# Patient Record
Sex: Female | Born: 1955 | Race: Black or African American | Hispanic: No | Marital: Married
Health system: Midwestern US, Community
[De-identification: ages and names within clinical notes are randomized; demographics above are authoritative.]

## PROBLEM LIST (undated history)

## (undated) DIAGNOSIS — G43909 Migraine, unspecified, not intractable, without status migrainosus: Secondary | ICD-10-CM

## (undated) DIAGNOSIS — E119 Type 2 diabetes mellitus without complications: Secondary | ICD-10-CM

## (undated) DIAGNOSIS — I10 Essential (primary) hypertension: Secondary | ICD-10-CM

## (undated) DIAGNOSIS — N200 Calculus of kidney: Secondary | ICD-10-CM

## (undated) DIAGNOSIS — M810 Age-related osteoporosis without current pathological fracture: Secondary | ICD-10-CM

## (undated) DIAGNOSIS — R079 Chest pain, unspecified: Secondary | ICD-10-CM

## (undated) DIAGNOSIS — I129 Hypertensive chronic kidney disease with stage 1 through stage 4 chronic kidney disease, or unspecified chronic kidney disease: Secondary | ICD-10-CM

## (undated) DIAGNOSIS — N83201 Unspecified ovarian cyst, right side: Secondary | ICD-10-CM

## (undated) DIAGNOSIS — N8501 Benign endometrial hyperplasia: Secondary | ICD-10-CM

## (undated) DIAGNOSIS — Z01818 Encounter for other preprocedural examination: Secondary | ICD-10-CM

## (undated) DIAGNOSIS — N950 Postmenopausal bleeding: Secondary | ICD-10-CM

## (undated) DIAGNOSIS — D259 Leiomyoma of uterus, unspecified: Secondary | ICD-10-CM

## (undated) DIAGNOSIS — Z1231 Encounter for screening mammogram for malignant neoplasm of breast: Secondary | ICD-10-CM

## (undated) DIAGNOSIS — K222 Esophageal obstruction: Secondary | ICD-10-CM

## (undated) DIAGNOSIS — R102 Pelvic and perineal pain: Secondary | ICD-10-CM

## (undated) DIAGNOSIS — I517 Cardiomegaly: Secondary | ICD-10-CM

## (undated) DIAGNOSIS — N83209 Unspecified ovarian cyst, unspecified side: Secondary | ICD-10-CM

## (undated) DIAGNOSIS — K5730 Diverticulosis of large intestine without perforation or abscess without bleeding: Secondary | ICD-10-CM

## (undated) DIAGNOSIS — E538 Deficiency of other specified B group vitamins: Secondary | ICD-10-CM

## (undated) DIAGNOSIS — K219 Gastro-esophageal reflux disease without esophagitis: Secondary | ICD-10-CM

## (undated) DIAGNOSIS — K259 Gastric ulcer, unspecified as acute or chronic, without hemorrhage or perforation: Secondary | ICD-10-CM

## (undated) DIAGNOSIS — D529 Folate deficiency anemia, unspecified: Secondary | ICD-10-CM

## (undated) DIAGNOSIS — E785 Hyperlipidemia, unspecified: Secondary | ICD-10-CM

## (undated) DIAGNOSIS — N281 Cyst of kidney, acquired: Secondary | ICD-10-CM

## (undated) DIAGNOSIS — I509 Heart failure, unspecified: Secondary | ICD-10-CM

## (undated) DIAGNOSIS — I2510 Atherosclerotic heart disease of native coronary artery without angina pectoris: Secondary | ICD-10-CM

## (undated) DIAGNOSIS — N189 Chronic kidney disease, unspecified: Secondary | ICD-10-CM

## (undated) DIAGNOSIS — I493 Ventricular premature depolarization: Secondary | ICD-10-CM

## (undated) DIAGNOSIS — E559 Vitamin D deficiency, unspecified: Secondary | ICD-10-CM

## (undated) DIAGNOSIS — G2581 Restless legs syndrome: Secondary | ICD-10-CM

## (undated) DIAGNOSIS — N179 Acute kidney failure, unspecified: Secondary | ICD-10-CM

## (undated) DIAGNOSIS — R87629 Unspecified abnormal cytological findings in specimens from vagina: Secondary | ICD-10-CM

## (undated) DIAGNOSIS — M8589 Other specified disorders of bone density and structure, multiple sites: Secondary | ICD-10-CM

## (undated) DIAGNOSIS — I340 Nonrheumatic mitral (valve) insufficiency: Secondary | ICD-10-CM

## (undated) DIAGNOSIS — N8502 Endometrial intraepithelial neoplasia [EIN]: Secondary | ICD-10-CM

## (undated) DIAGNOSIS — G4733 Obstructive sleep apnea (adult) (pediatric): Secondary | ICD-10-CM

## (undated) DIAGNOSIS — E282 Polycystic ovarian syndrome: Secondary | ICD-10-CM

## (undated) DIAGNOSIS — M879 Osteonecrosis, unspecified: Secondary | ICD-10-CM

## (undated) DIAGNOSIS — L680 Hirsutism: Secondary | ICD-10-CM

## (undated) HISTORY — DX: Essential (primary) hypertension: I10

## (undated) HISTORY — DX: Migraine, unspecified, not intractable, without status migrainosus: G43.909

## (undated) HISTORY — PX: GASTRIC BYPASS: SHX52

## (undated) HISTORY — DX: Type 2 diabetes mellitus without complications: E11.9

## (undated) HISTORY — DX: Unspecified abnormal cytological findings in specimens from vagina: R87.629

## (undated) HISTORY — DX: Deficiency of other specified B group vitamins: E53.8

## (undated) HISTORY — DX: Other specified disorders of bone density and structure, multiple sites: M85.89

## (undated) HISTORY — DX: Heart failure, unspecified: I50.9

## (undated) HISTORY — DX: Diverticulosis of large intestine without perforation or abscess without bleeding: K57.30

## (undated) HISTORY — DX: Vitamin D deficiency, unspecified: E55.9

## (undated) HISTORY — PX: ABDOMINAL HYSTERECTOMY: SHX81

## (undated) HISTORY — DX: Ventricular premature depolarization: I49.3

## (undated) HISTORY — DX: Acute kidney failure, unspecified: N17.9

## (undated) HISTORY — DX: Unspecified ovarian cyst, unspecified side: N83.209

## (undated) HISTORY — DX: Age-related osteoporosis without current pathological fracture: M81.0

## (undated) HISTORY — DX: Chronic kidney disease, unspecified: N18.9

## (undated) HISTORY — DX: Hyperlipidemia, unspecified: E78.5

## (undated) HISTORY — DX: Folate deficiency anemia, unspecified: D52.9

## (undated) HISTORY — DX: Restless legs syndrome: G25.81

## (undated) HISTORY — DX: Osteonecrosis, unspecified: M87.9

## (undated) HISTORY — DX: Gastro-esophageal reflux disease without esophagitis: K21.9

## (undated) HISTORY — DX: Nonrheumatic mitral (valve) insufficiency: I34.0

## (undated) HISTORY — DX: Polycystic ovarian syndrome: E28.2

## (undated) HISTORY — DX: Cardiomegaly: I51.7

## (undated) HISTORY — DX: Endometrial intraepithelial neoplasia (EIN): N85.02

## (undated) HISTORY — DX: Gastric ulcer, unspecified as acute or chronic, without hemorrhage or perforation: K25.9

## (undated) HISTORY — DX: Cyst of kidney, acquired: N28.1

## (undated) HISTORY — PX: OOPHORECTOMY: SHX86

## (undated) HISTORY — DX: Obstructive sleep apnea (adult) (pediatric): G47.33

## (undated) HISTORY — DX: Atherosclerotic heart disease of native coronary artery without angina pectoris: I25.10

## (undated) NOTE — Progress Notes (Signed)
 Formatting of this note might be different from the original. 1. Have you been to the ER, urgent care clinic since your last visit?  Hospitalized since your last visit?   Yes When: 12/2020 Where: [facility]  Reason for visit: hysterectomy 2. Have you seen or consulted any other health care providers outside of the [facility] since your last visit?  Include any pap smears or colon screening.    No   Electronically signed by Vaughn Dess at 04/21/2021 10:53 AM EST

## (undated) NOTE — Progress Notes (Signed)
 Formatting of this note is different from the original. HISTORY OF PRESENT ILLNESS Monique Graham is a 22 y.o. female.  08/25/2020 Patient seen today for new patient evaluation.  She is here for evaluation of chest pain.  She was in emergency room recently with complaint of chest tightness going on for about 10 days.  She has been having episode of chest tightness.  Also has been feeling palpitation.  These are progressive.  She is short of breath and only heavy exertion.  No edema.  No orthopnea she does notice some breathing disturbance during sleep according to her spouse.  Review of Systems  Constitutional:  Negative for chills and fever.  HENT:  Negative for nosebleeds.   Eyes:  Negative for blurred vision and double vision.  Respiratory:  Negative for cough, hemoptysis, sputum production, shortness of breath and wheezing.   Cardiovascular:  Positive for chest pain and palpitations. Negative for orthopnea, claudication, leg swelling and PND.  Gastrointestinal:  Negative for abdominal pain, heartburn, nausea and vomiting.  Musculoskeletal:  Negative for myalgias.  Skin:  Negative for rash.  Neurological:  Negative for dizziness, weakness and headaches.  Endo/Heme/Allergies:  Does not bruise/bleed easily. 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   Gout    Hypercholesteremia    Hypertension    Kidney cysts    Kidney stones    Migraines    not in 7 years   Obesity    Sarcoidosis unsure   pt states in remission for a long time   Past Surgical History:  Procedure Laterality Date   HX COLONOSCOPY     HX DILATION AND CURETTAGE     HX ENDOSCOPY     several times   HX TONSILLECTOMY  age 60    PR LAP GASTRIC BYPASS/ROUX-EN-Y  2005   Social History   Tobacco Use   Smoking status: Former    Packs/day: 1.00    Types: Cigarettes   Smokeless tobacco: Never   Tobacco comments:    30-40 years  Substance Use Topics   Alcohol use: Yes    Comment: wine during the holidays   Allergies  Allergen Reactions   Latex, Natural Rubber Rash   Percocet [Oxycodone-Acetaminophen] Anaphylaxis   Lactose Diarrhea   Prior to Admission medications   Medication Sig Start Date End Date Taking? Authorizing Provider  FOLIC ACID PO Take 800 mcg by mouth daily.   Yes Provider, Historical  metoprolol  tartrate (LOPRESSOR ) 50 mg tablet Take 1 Tablet by mouth two (2) times a day. 04/21/21  Yes Patel, Apurva M, MD  ibuprofen (MOTRIN) 800 mg tablet Take 1 Tablet by mouth every eight (8) hours as needed for Pain. 12/20/20  Yes Sharl Harlene PARAS, MD  atorvastatin  (LIPITOR) 40 mg tablet TAKE 1 TABLET BY MOUTH EVERY DAY 12/12/20  Yes Patel, Apurva M, MD  aspirin delayed-release 81 mg tablet Take 81 mg by mouth daily.   Yes Provider, Historical  cholecalciferol, vitamin D3, 50 mcg (2,000 unit) tab Take 1 Tablet by mouth daily. 05/25/20  Yes Provider, Historical  losartan (COZAAR) 100 mg tablet Take  100 mg by mouth daily. 06/14/20 06/14/21 Yes Provider, Historical  sodium bicarbonate 650 mg tablet Take 650 mg by mouth two (2) times a day. 05/25/20  Yes Provider, Historical  allopurinoL (ZYLOPRIM) 100 mg tablet Take  by mouth as needed. am   Yes Provider, Historical  cloNIDine  HCL (CATAPRES ) 0.1 mg tablet Take 0.1 mg by mouth two (2) times a day.   Yes Provider, Historical  acetaminophen (TYLENOL) 650 mg TbER Take 650 mg by mouth as needed.   Yes Provider, Historical   Visit Vitals BP (!) 159/113  Pulse 95  Ht 5' 4 (1.626 m)  Wt 107.5 kg (237 lb)  SpO2 98%  BMI 40.68 kg/m   Physical Exam Constitutional:      Appearance: She is well-developed.  HENT:     Head: Normocephalic and atraumatic.  Eyes:      Conjunctiva/sclera: Conjunctivae normal.  Neck:     Thyroid: No thyromegaly.     Vascular: No JVD.     Trachea: No tracheal deviation.  Cardiovascular:     Rate and Rhythm: Normal rate and regular rhythm.     Chest Wall: PMI is not displaced.     Pulses: No decreased pulses.     Heart sounds: No murmur heard.   No gallop. No S3 sounds.  Pulmonary:     Effort: No respiratory distress.     Breath sounds: No wheezing or rales.  Chest:     Chest wall: No tenderness.  Abdominal:     Palpations: Abdomen is soft.     Tenderness: There is no abdominal tenderness.  Musculoskeletal:     Cervical back: Neck supple.  Skin:    General: Skin is warm.  Neurological:     Mental Status: She is alert and oriented to person, place, and time.   Ms. Werk has a reminder for a due or due soon health maintenance. I have asked that she contact her primary care provider for follow-up on this health maintenance.  No flowsheet data found. I have personally reviewed patient's records available from hospital and other providers and incorporated findings in patient care. Provider notes, lab, EKG, chest x-ray I have personally reviewed patients ekg done at other facility. Sinus rhythm, LVH, nonspecific ST-T changes IMPRESSION.  Chest CT for coronary calcium  for 2022  Coronary calcium  score 159. IMPRESSION CT 08/2020  Diffuse hepatic steatosis. Healed granulomatous disease. Benign noncalcified small node left lower lobe. No imaging follow-up needed. Coronary and cardiac findings will be described under a separate report completed by cardiology.  Interpretation Summary 09/2020     Left Ventricle: Normal left ventricular systolic function with a visually estimated EF of 55 - 60%. Left ventricle size is normal. Increased wall thickness. Findings consistent with mild concentric hypertrophy. Normal wall motion. Diastolic dysfunction present with increased LAP with normal LV EF.  Charlanne Nolan POUR, MD  ECG Reader, SPECT Reader, Test Supervisor 09/30/2020   Interpretation Summary 09/2020     Nuclear Findings: Normal left ventricular systolic function post-stress. LVEF measures 66%.   Nuclear Findings: Normal treadmill myocardial perfusion study at 94 %% PHR. Findings suggest a low risk of myocardial ischemia. LVEF measures 66%.   Nuclear Findings: LVEF measures 66%. LV perfusion is normal.   ECG: Resting ECG demonstrates normal sinus rhythm.   ECG: Stress ECG was negative for ischemia.   Stress Test: A Bruce protocol stress test was performed. Overall, the patient's exercise capacity was poor for their age. The patient exercised for 2 min  and 57 sec. Hemodynamics are adequate for diagnosis. Blood pressure demonstrated a normal response and heart rate demonstrated a normal response to stress.  09/2020-event monitor Rare PAC and PVC.  Occasional symptom with PVC Assessment     ICD-10-CM ICD-9-CM   1. Coronary artery calcification  I25.10 414.00    I25.84 414.4    Stable symptoms continue treatment monitor   2. Primary hypertension  I10 401.9    Elevated blood pressure adjust medicine   3. PVC (premature ventricular contraction)  I49.3 427.69    Stable symptom monitor   4. Chest pain, unspecified type  R07.9 786.50    Stable continue treatment   5. Palpitation  R00.2 785.1    Intermittent increased palpitation increase metoprolol     08/2020 Seen for atypical chest pain and palpitation.  Has history of sarcoidosis in past.  Follow-up after testing.  If needed consider sleep apnea evaluation  09/2020 Work-up reviewed.  Medical management.  Follow-up lab after statin treatment continue aspirin 04/2021 Recent increased palpitation blood pressure elevated increase metoprolol  to 50 mg twice a day continue other treatment.  Overall has weight loss, continue dietary modification and exercise  Medications Discontinued During This Encounter  Medication Reason   ibuprofen (MOTRIN) 800 mg  tablet Not A Current Medication   B.animalis,bifid,infantis,long 10-15 mg TbEC Not A Current Medication   metoprolol  tartrate (LOPRESSOR ) 25 mg tablet    Orders Placed This Encounter   metoprolol  tartrate (LOPRESSOR ) 50 mg tablet    Sig: Take 1 Tablet by mouth two (2) times a day.    Dispense:  180 Tablet    Refill:  3   Follow-up and Dispositions   Return in about 6 months (around 10/20/2021).    Electronically signed by Tobie Elene HERO, MD at 04/21/2021 10:53 AM EST

---

## 2005-08-23 ENCOUNTER — Emergency Department: Payer: Self-pay | Admitting: Emergency Medicine

## 2005-09-10 ENCOUNTER — Emergency Department (HOSPITAL_COMMUNITY): Admission: EM | Admit: 2005-09-10 | Discharge: 2005-09-10 | Payer: Self-pay | Admitting: Emergency Medicine

## 2008-06-01 ENCOUNTER — Encounter: Admission: RE | Admit: 2008-06-01 | Discharge: 2008-06-01 | Payer: Self-pay | Admitting: Internal Medicine

## 2011-10-01 ENCOUNTER — Observation Stay: Payer: Self-pay | Admitting: Internal Medicine

## 2011-10-01 LAB — COMPREHENSIVE METABOLIC PANEL
Alkaline Phosphatase: 112 U/L (ref 50–136)
Calcium, Total: 8.6 mg/dL (ref 8.5–10.1)
Co2: 23 mmol/L (ref 21–32)
EGFR (African American): 39 — ABNORMAL LOW
EGFR (Non-African Amer.): 34 — ABNORMAL LOW
Osmolality: 286 (ref 275–301)
Potassium: 4 mmol/L (ref 3.5–5.1)
SGOT(AST): 20 U/L (ref 15–37)
Sodium: 141 mmol/L (ref 136–145)
Total Protein: 8.6 g/dL — ABNORMAL HIGH (ref 6.4–8.2)

## 2011-10-01 LAB — CBC
HCT: 41.3 % (ref 35.0–47.0)
HGB: 12.8 g/dL (ref 12.0–16.0)
MCH: 23 pg — ABNORMAL LOW (ref 26.0–34.0)
MCV: 75 fL — ABNORMAL LOW (ref 80–100)
Platelet: 274 10*3/uL (ref 150–440)
RBC: 5.55 10*6/uL — ABNORMAL HIGH (ref 3.80–5.20)

## 2011-10-01 LAB — URINALYSIS, COMPLETE
Blood: NEGATIVE
Ketone: NEGATIVE
Leukocyte Esterase: NEGATIVE
Protein: NEGATIVE
Specific Gravity: 1.017 (ref 1.003–1.030)
Squamous Epithelial: NONE SEEN

## 2011-10-01 LAB — CK TOTAL AND CKMB (NOT AT ARMC): CK, Total: 106 U/L (ref 21–215)

## 2011-10-02 LAB — LIPID PANEL
Cholesterol: 257 mg/dL — ABNORMAL HIGH (ref 0–200)
HDL Cholesterol: 35 mg/dL — ABNORMAL LOW (ref 40–60)
Ldl Cholesterol, Calc: 181 mg/dL — ABNORMAL HIGH (ref 0–100)
VLDL Cholesterol, Calc: 41 mg/dL — ABNORMAL HIGH (ref 5–40)

## 2011-10-02 LAB — HEMOGLOBIN A1C: Hemoglobin A1C: 6.6 % — ABNORMAL HIGH (ref 4.2–6.3)

## 2012-11-01 IMAGING — CT CT HEAD WITHOUT CONTRAST
2 series · 16 of 30 positions shown, 20 images · non-contrast
Comparison: none

REASON FOR EXAM: numbness/tingling
COMMENTS:

PROCEDURE:     CT  - CT HEAD WITHOUT CONTRAST  - October 01, 2011  [DATE]
RESULT:     History: Numbness.
Comparison Study: None.

[Series 2: without · axial · non-contrast · 0.41mm/px · z∈[-176,-50]mm · 13 of 31 slices shown, 17 images]
[im 3/31  brain]
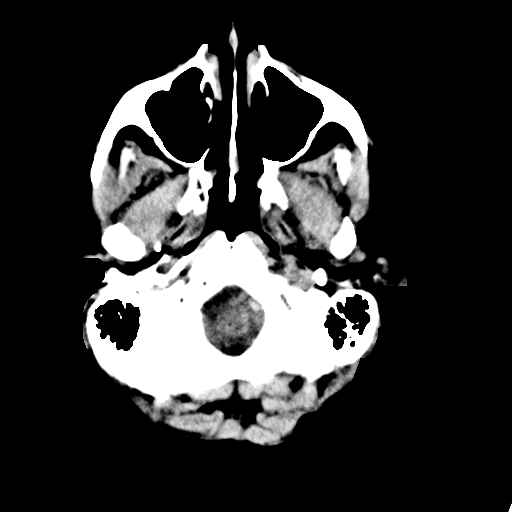
[im 3/31  bone]
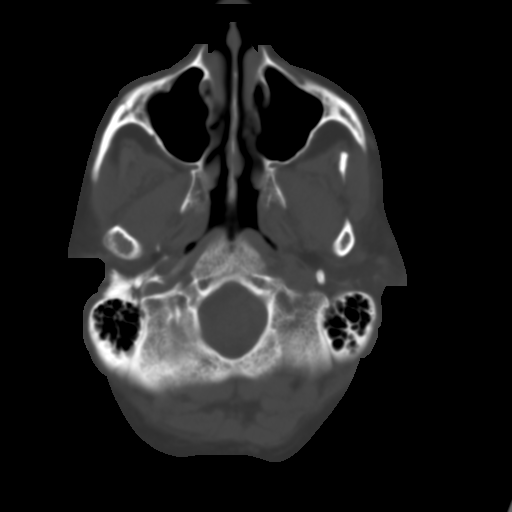
[im 5/31  brain]
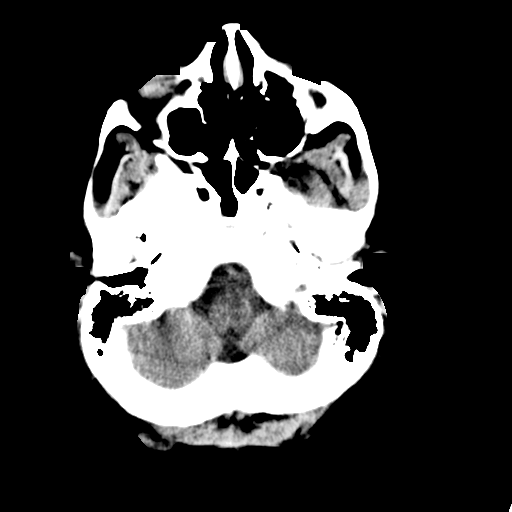
[im 7/31  brain]
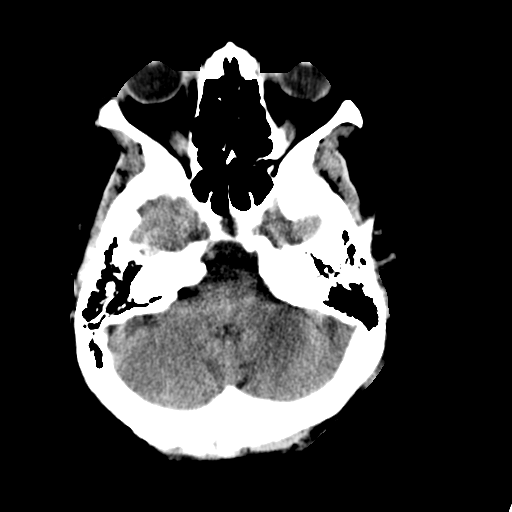
[im 9/31  brain]
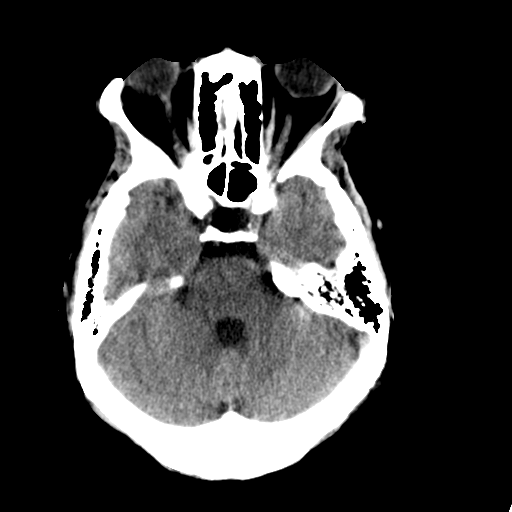
[im 11/31  brain]
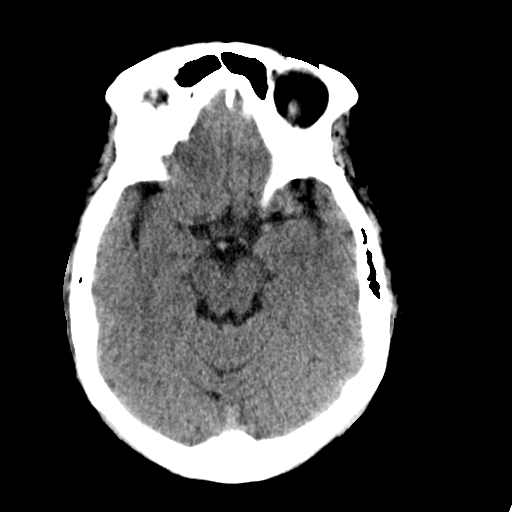
[im 11/31  bone]
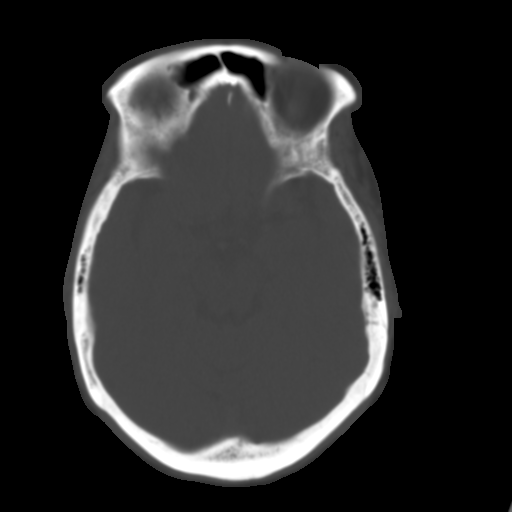
[im 13/31  brain]
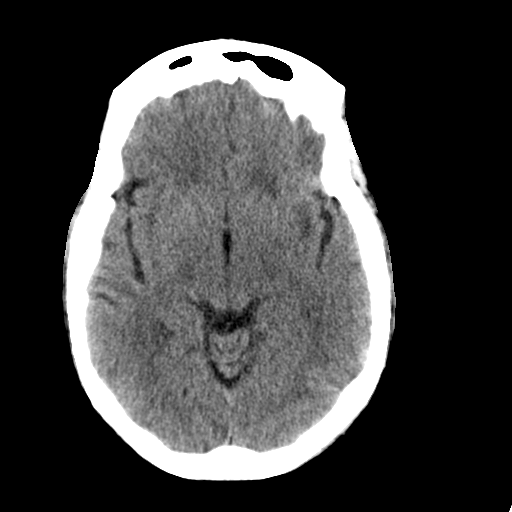
[im 16/31  brain]
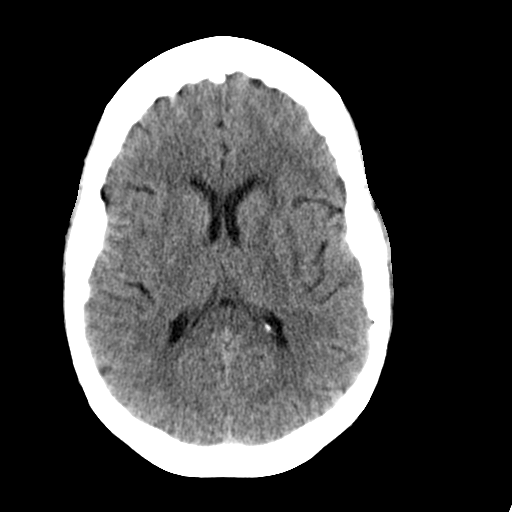
[im 18/31  brain]
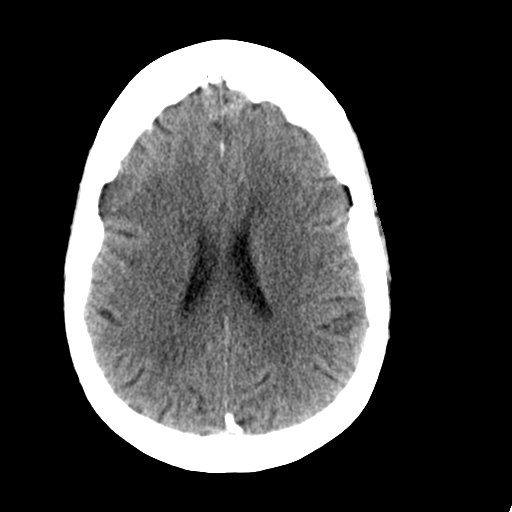
[im 20/31  brain]
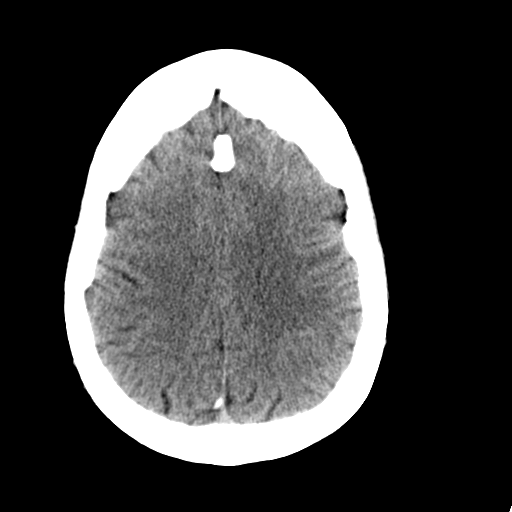
[im 20/31  bone]
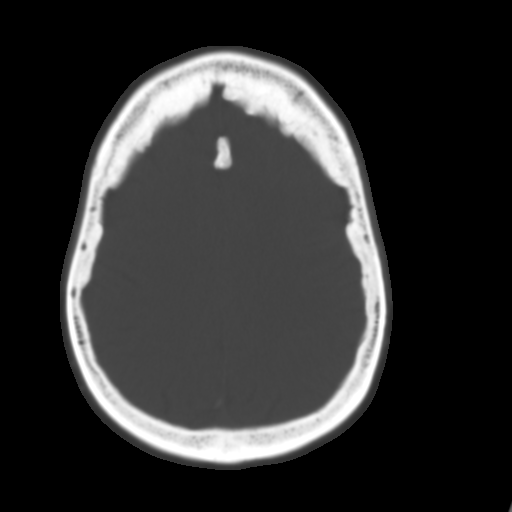
[im 22/31  brain]
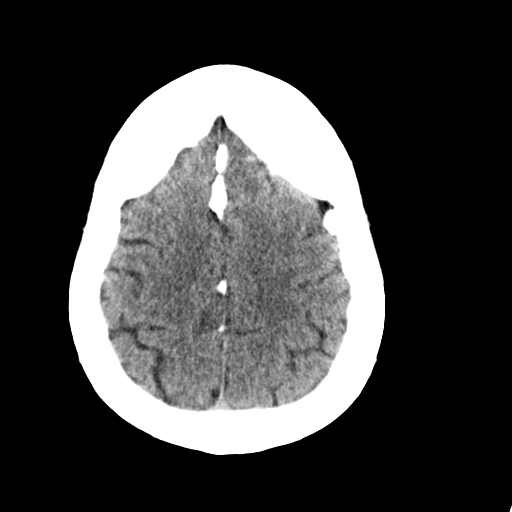
[im 24/31  brain]
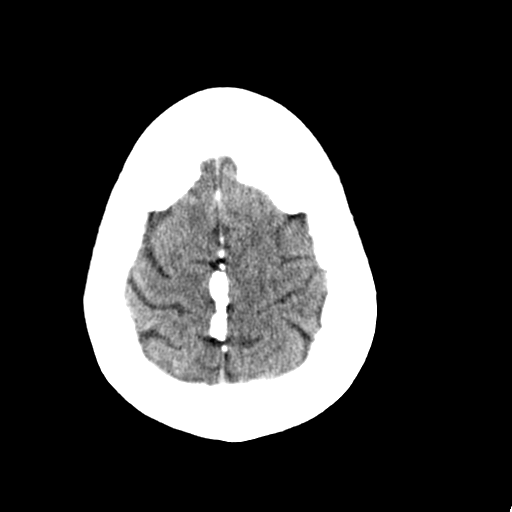
[im 26/31  brain]
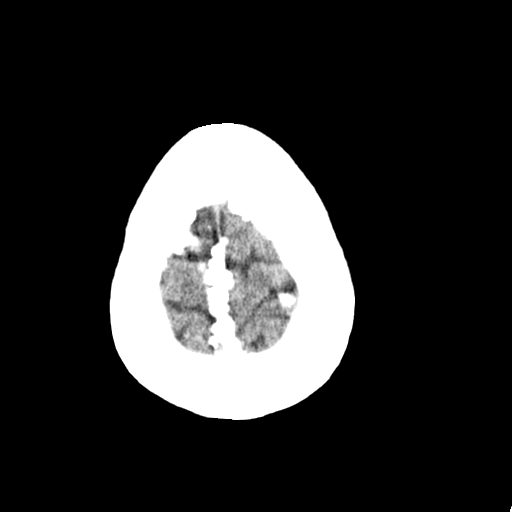
[im 28/31  brain]
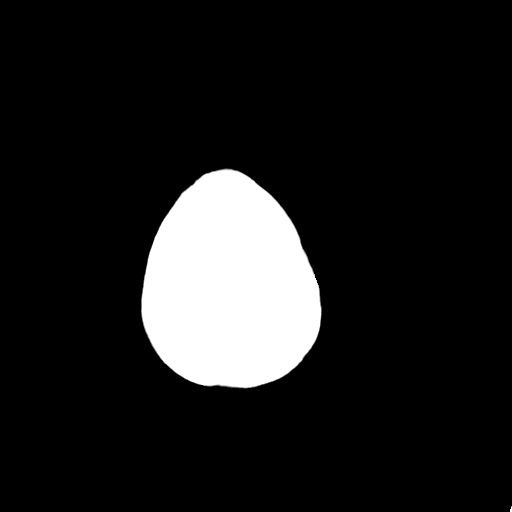
[im 28/31  bone]
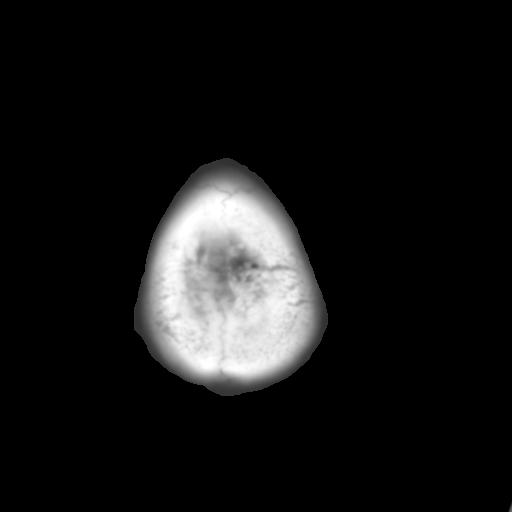

[Series 3: bone · axial · 0.41mm/px · z∈[-176,-136]mm · 3 of 31 slices shown]
[im 3/31  bone]
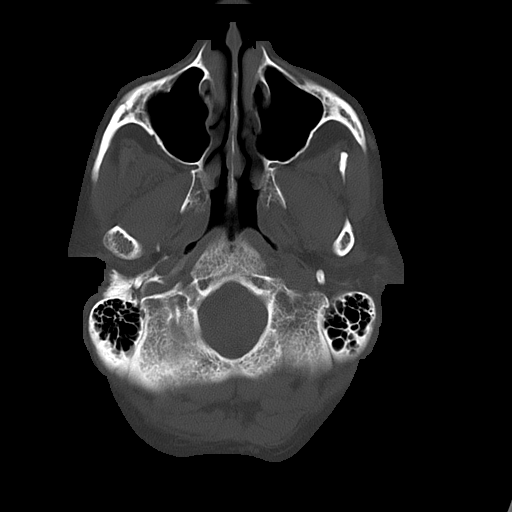
[im 7/31  bone]
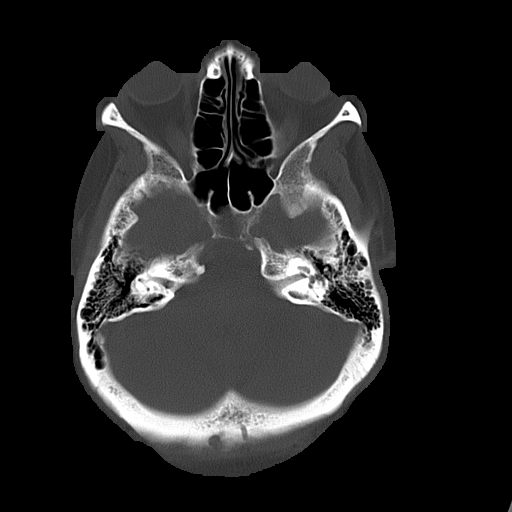
[im 11/31  bone]
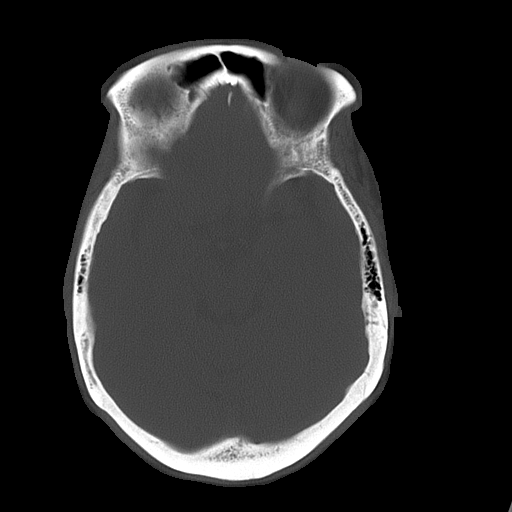

[16 of 30 positions shown; findings below may reference images not displayed]

FINDINGS: No mass. No hydrocephalus. No hemorrhage. White matter changes
consistent with chronic ischemia. Lucency noted in the brainstem consistent
with old infarct. Dural calcification present. No acute bony abnormality.
IMPRESSION: White matter changes consistent with chronic ischemia. No
acute abnormality.

## 2012-11-01 IMAGING — CR DG CHEST 2V
1 series · 2 of 2 positions shown · non-contrast
Comparison: none

REASON FOR EXAM: chest pain
COMMENTS:

[Series 1: w chest pa · 0.14mm/px · 2 of 2 slices shown]
[im 1/2]
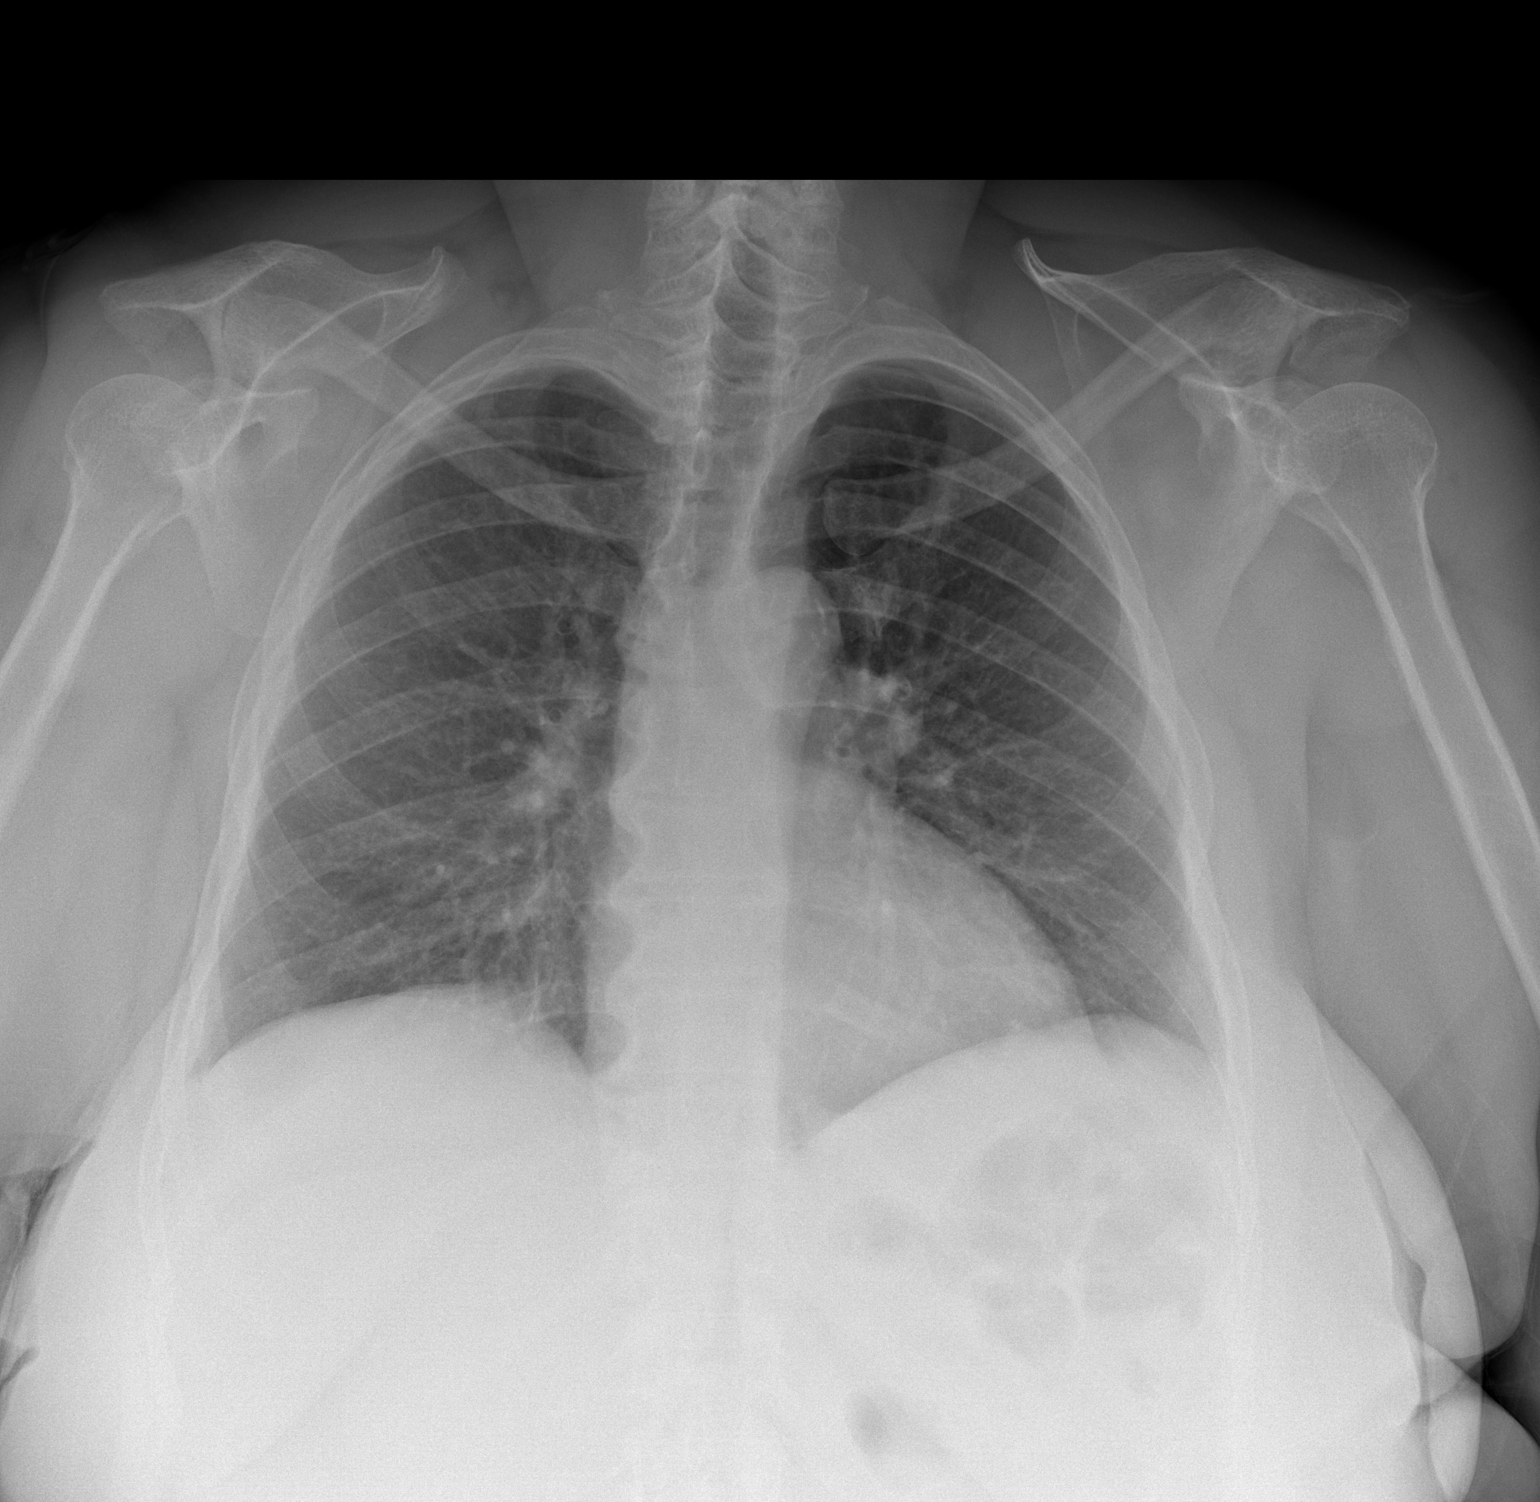
[im 2/2]
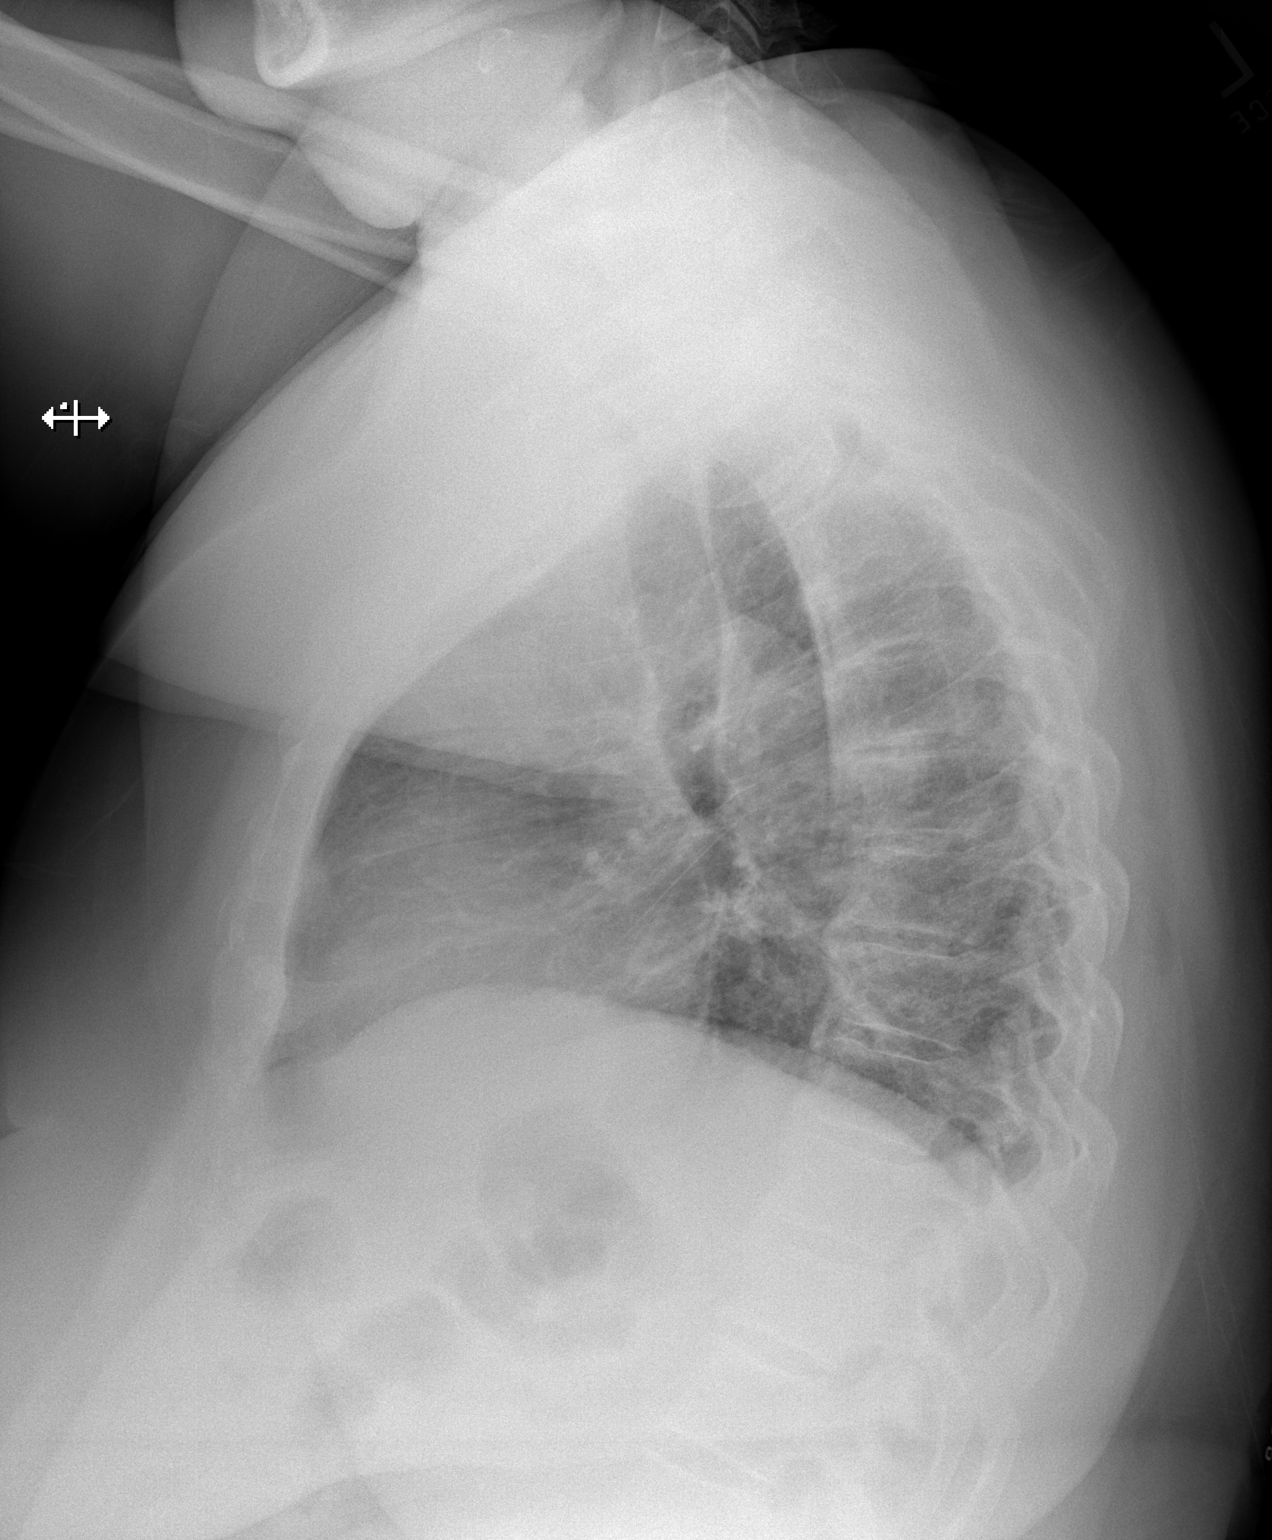

[2 of 2 positions shown; findings below may reference images not displayed]

PROCEDURE:     DXR - DXR CHEST PA (OR AP) AND LATERAL  - October 01, 2011  [DATE]

RESULT:     There is no previous exam for comparison.

The lungs are clear. The heart and pulmonary vessels are normal. The bony
and mediastinal structures are unremarkable. There is no effusion. There is
no pneumothorax or evidence of congestive failure.
IMPRESSION: No acute cardiopulmonary disease.

[REDACTED]

## 2013-12-18 ENCOUNTER — Observation Stay: Payer: BLUE CROSS/BLUE SHIELD | Admitting: Internal Medicine

## 2013-12-18 ENCOUNTER — Emergency Department: Payer: BLUE CROSS/BLUE SHIELD

## 2013-12-18 ENCOUNTER — Observation Stay
Admission: EM | Admit: 2013-12-18 | Discharge: 2013-12-20 | Disposition: A | Discharge status: Home / Self Care | Payer: BLUE CROSS/BLUE SHIELD | Attending: Internal Medicine | Admitting: Internal Medicine

## 2013-12-18 DIAGNOSIS — I10 Essential (primary) hypertension: Secondary | ICD-10-CM | POA: Insufficient documentation

## 2013-12-18 DIAGNOSIS — R51 Headache: Secondary | ICD-10-CM | POA: Insufficient documentation

## 2013-12-18 DIAGNOSIS — N179 Acute kidney failure, unspecified: Secondary | ICD-10-CM | POA: Insufficient documentation

## 2013-12-18 DIAGNOSIS — I6789 Other cerebrovascular disease: Secondary | ICD-10-CM | POA: Insufficient documentation

## 2013-12-18 DIAGNOSIS — I160 Hypertensive urgency: Secondary | ICD-10-CM | POA: Insufficient documentation

## 2013-12-18 DIAGNOSIS — Z8249 Family history of ischemic heart disease and other diseases of the circulatory system: Secondary | ICD-10-CM | POA: Insufficient documentation

## 2013-12-18 DIAGNOSIS — E669 Obesity, unspecified: Secondary | ICD-10-CM | POA: Insufficient documentation

## 2013-12-18 DIAGNOSIS — Z833 Family history of diabetes mellitus: Secondary | ICD-10-CM | POA: Insufficient documentation

## 2013-12-18 DIAGNOSIS — E119 Type 2 diabetes mellitus without complications: Secondary | ICD-10-CM | POA: Insufficient documentation

## 2013-12-18 DIAGNOSIS — Z9884 Bariatric surgery status: Secondary | ICD-10-CM | POA: Insufficient documentation

## 2013-12-18 DIAGNOSIS — D509 Iron deficiency anemia, unspecified: Secondary | ICD-10-CM | POA: Diagnosis present

## 2013-12-18 DIAGNOSIS — R0789 Other chest pain: Secondary | ICD-10-CM

## 2013-12-18 DIAGNOSIS — Z6841 Body Mass Index (BMI) 40.0 and over, adult: Secondary | ICD-10-CM | POA: Insufficient documentation

## 2013-12-18 DIAGNOSIS — R079 Chest pain, unspecified: Principal | ICD-10-CM | POA: Insufficient documentation

## 2013-12-18 LAB — B-TYPE NATRIURETIC PEPTIDE: B-Natriuretic Peptide: 19 pg/mL (ref 0–100)

## 2013-12-18 LAB — COMPREHENSIVE METABOLIC PANEL
ALT: 22 U/L (ref 0–55)
AST (SGOT): 19 U/L (ref 5–34)
Albumin/Globulin Ratio: 0.9 (ref 0.9–2.2)
Albumin: 3.6 g/dL (ref 3.5–5.0)
Alkaline Phosphatase: 121 U/L — ABNORMAL HIGH (ref 37–106)
Anion Gap: 10 (ref 5.0–15.0)
BUN: 11 mg/dL (ref 7.0–19.0)
Bilirubin, Total: 0.7 mg/dL (ref 0.2–1.2)
CO2: 23 mEq/L (ref 22–29)
Calcium: 9 mg/dL (ref 8.5–10.5)
Chloride: 109 mEq/L (ref 100–111)
Creatinine: 1.2 mg/dL — ABNORMAL HIGH (ref 0.6–1.0)
Globulin: 3.8 g/dL — ABNORMAL HIGH (ref 2.0–3.6)
Glucose: 112 mg/dL — ABNORMAL HIGH (ref 70–100)
Potassium: 4.2 mEq/L (ref 3.5–5.1)
Protein, Total: 7.4 g/dL (ref 6.0–8.3)
Sodium: 142 mEq/L (ref 136–145)

## 2013-12-18 LAB — CBC AND DIFFERENTIAL
Basophils Absolute Automated: 0.01 10*3/uL (ref 0.00–0.20)
Basophils Automated: 0 %
Eosinophils Absolute Automated: 0.16 10*3/uL (ref 0.00–0.70)
Eosinophils Automated: 3 %
Hematocrit: 39.6 % (ref 37.0–47.0)
Hgb: 12.5 g/dL (ref 12.0–16.0)
Lymphocytes Absolute Automated: 1.95 10*3/uL (ref 0.50–4.40)
Lymphocytes Automated: 32 %
MCH: 23.5 pg — ABNORMAL LOW (ref 28.0–32.0)
MCHC: 31.6 g/dL — ABNORMAL LOW (ref 32.0–36.0)
MCV: 74.6 fL — ABNORMAL LOW (ref 80.0–100.0)
MPV: 12.4 fL — ABNORMAL HIGH (ref 9.4–12.3)
Monocytes Absolute Automated: 0.42 10*3/uL (ref 0.00–1.20)
Monocytes: 7 %
Neutrophils Absolute: 3.56 10*3/uL (ref 1.80–8.10)
Neutrophils: 58 %
Platelets: 252 10*3/uL (ref 140–400)
RBC: 5.31 10*6/uL (ref 4.20–5.40)
RDW: 17 % — ABNORMAL HIGH (ref 12–15)
WBC: 6.1 10*3/uL (ref 3.50–10.80)

## 2013-12-18 LAB — TROPONIN I
Troponin I: 0.02 ng/mL (ref 0.00–0.09)
Troponin I: <0.01 ng/mL (ref 0.00–0.09)
Troponin I: <0.01 ng/mL (ref 0.00–0.09)

## 2013-12-18 LAB — PT/INR
PT INR: 1 (ref 0.9–1.1)
PT: 13.3 s (ref 12.6–15.0)

## 2013-12-18 LAB — GFR: EGFR: 45.9

## 2013-12-18 LAB — LIPASE: Lipase: 9 U/L (ref 8–78)

## 2013-12-18 MED ORDER — DIPHENHYDRAMINE HCL 25 MG PO CAPS
25.0000 mg | ORAL_CAPSULE | Freq: Four times a day (QID) | ORAL | Status: DC | PRN
Start: 2013-12-18 — End: 2013-12-20

## 2013-12-18 MED ORDER — ACETAMINOPHEN 500 MG PO TABS
1000.0000 mg | ORAL_TABLET | Freq: Once | ORAL | Status: AC
Start: 2013-12-18 — End: 2013-12-18
  Administered 2013-12-18: 1000 mg via ORAL
  Filled 2013-12-18: qty 2

## 2013-12-18 MED ORDER — DIPHENHYDRAMINE HCL 50 MG/ML IJ SOLN
12.5000 mg | Freq: Four times a day (QID) | INTRAMUSCULAR | Status: DC | PRN
Start: 2013-12-18 — End: 2013-12-20
  Administered 2013-12-18 – 2013-12-19 (×3): 12.5 mg via INTRAVENOUS
  Filled 2013-12-18 (×3): qty 1

## 2013-12-18 MED ORDER — NITROGLYCERIN 0.4 MG SL SUBL
0.4000 mg | SUBLINGUAL_TABLET | SUBLINGUAL | Status: DC | PRN
Start: 2013-12-18 — End: 2013-12-20

## 2013-12-18 MED ORDER — IBUPROFEN 600 MG PO TABS
600.0000 mg | ORAL_TABLET | Freq: Four times a day (QID) | ORAL | Status: DC | PRN
Start: 2013-12-18 — End: 2013-12-20
  Administered 2013-12-19: 600 mg via ORAL
  Filled 2013-12-18: qty 1

## 2013-12-18 MED ORDER — BISACODYL 5 MG PO TBEC
5.0000 mg | DELAYED_RELEASE_TABLET | Freq: Every day | ORAL | Status: DC | PRN
Start: 2013-12-18 — End: 2013-12-20

## 2013-12-18 MED ORDER — LABETALOL HCL 5 MG/ML IV SOLN
10.0000 mg | INTRAVENOUS | Status: DC | PRN
Start: 2013-12-18 — End: 2013-12-20
  Administered 2013-12-19: 10 mg via INTRAVENOUS
  Filled 2013-12-18: qty 4

## 2013-12-18 MED ORDER — ASPIRIN 325 MG PO TABS
325.0000 mg | ORAL_TABLET | Freq: Once | ORAL | Status: AC
Start: 2013-12-18 — End: 2013-12-18
  Administered 2013-12-18: 325 mg via ORAL
  Filled 2013-12-18: qty 1

## 2013-12-18 MED ORDER — MORPHINE SULFATE 4 MG/ML IJ/IV SOLN (WRAP)
Status: AC
Start: 2013-12-18 — End: 2013-12-19
  Filled 2013-12-18: qty 1

## 2013-12-18 MED ORDER — HYDROMORPHONE HCL PF 1 MG/ML IJ SOLN
0.5000 mg | INTRAMUSCULAR | Status: DC | PRN
Start: 2013-12-18 — End: 2013-12-20
  Administered 2013-12-18 – 2013-12-19 (×3): 0.5 mg via INTRAVENOUS
  Filled 2013-12-18 (×3): qty 1

## 2013-12-18 MED ORDER — AMLODIPINE BESYLATE 5 MG PO TABS
10.0000 mg | ORAL_TABLET | Freq: Every day | ORAL | Status: DC
Start: 2013-12-19 — End: 2013-12-19
  Filled 2013-12-18: qty 2

## 2013-12-18 MED ORDER — NITROGLYCERIN 0.4 MG SL SUBL
0.4000 mg | SUBLINGUAL_TABLET | SUBLINGUAL | Status: DC | PRN
Start: 2013-12-18 — End: 2013-12-18
  Administered 2013-12-18 (×3): 0.4 mg via SUBLINGUAL
  Filled 2013-12-18: qty 1

## 2013-12-18 MED ORDER — NALOXONE HCL 0.4 MG/ML IJ SOLN
0.2000 mg | INTRAMUSCULAR | Status: DC | PRN
Start: 2013-12-18 — End: 2013-12-20

## 2013-12-18 MED ORDER — ONDANSETRON HCL 4 MG/2ML IJ SOLN
4.0000 mg | Freq: Once | INTRAMUSCULAR | Status: AC
Start: 2013-12-18 — End: 2013-12-18
  Administered 2013-12-18: 4 mg via INTRAVENOUS

## 2013-12-18 MED ORDER — ENOXAPARIN SODIUM 40 MG/0.4ML SC SOLN
40.0000 mg | Freq: Every day | SUBCUTANEOUS | Status: DC
Start: 2013-12-18 — End: 2013-12-20
  Administered 2013-12-18 – 2013-12-19 (×2): 40 mg via SUBCUTANEOUS
  Filled 2013-12-18 (×2): qty 0.4

## 2013-12-18 MED ORDER — SENNOSIDES-DOCUSATE SODIUM 8.6-50 MG PO TABS
2.0000 | ORAL_TABLET | Freq: Every evening | ORAL | Status: DC | PRN
Start: 2013-12-18 — End: 2013-12-20

## 2013-12-18 MED ORDER — ACETAMINOPHEN 325 MG PO TABS
650.0000 mg | ORAL_TABLET | ORAL | Status: DC | PRN
Start: 2013-12-18 — End: 2013-12-20

## 2013-12-18 MED ORDER — ONDANSETRON HCL 4 MG/2ML IJ SOLN
INTRAMUSCULAR | Status: AC
Start: 2013-12-18 — End: 2013-12-19
  Filled 2013-12-18: qty 2

## 2013-12-18 MED ORDER — MORPHINE SULFATE 4 MG/ML IJ/IV SOLN (WRAP)
4.0000 mg | Freq: Once | Status: DC
Start: 2013-12-18 — End: 2013-12-20

## 2013-12-18 NOTE — ED Provider Notes (Addendum)
Physician/Midlevel provider first contact with patient: 12/18/13 1130         History     Chief Complaint   Patient presents with   . Chest Pain     HPI Comments: Per patient, was driving home to [place] when became dizzy (on-going since this AM) but had chest pain, which prompted her visiting the ER.  She works in [place] and commutes to [employer] on weekends.  No prior history of similar pain but has had numerous stress tests, as recently as two years ago, which were negative for cardiac disease.    Patient is a 58 y.o. female presenting with chest pain. The history is provided by the patient. No language interpreter was used.   Chest Pain  Pain location:  Substernal area  Pain quality: stabbing, throbbing and tightness    Pain quality: not tearing    Pain radiates to:  L arm  Pain radiates to the back: no    Pain severity:  Moderate  Onset quality:  Sudden  Duration:  2 hours  Timing:  Constant  Progression:  Worsening  Chronicity:  New  Context: not breathing, no movement and no stress    Relieved by:  None tried  Worsened by:  Nothing tried  Ineffective treatments:  None tried  Associated symptoms: dizziness, near-syncope and weakness    Associated symptoms: no abdominal pain, no cough, no fever, no nausea, no orthopnea, no palpitations, no PND, no shortness of breath and no syncope    Risk factors: diabetes mellitus, hypertension and obesity          Past Medical History   Diagnosis Date   . Hypertension    . Diabetes mellitus    . Migraine        Past Surgical History   Procedure Laterality Date   . Gastric bypass         Family History   Problem Relation Age of Onset   . Cancer Mother    . Heart disease Mother    . Diabetes Mother        Social  History   Substance Use Topics   . Smoking status: Never Smoker    . Smokeless tobacco: Not on file   . Alcohol Use: No       .     Allergies   Allergen Reactions   . Percocet [Oxycodone-Acetaminophen]        Current/Home Medications    AMLODIPINE BESYLATE PO    Take by  mouth.    LISINOPRIL (PRINIVIL,ZESTRIL) 40 MG TABLET    Take 40 mg by mouth daily.    METFORMIN (GLUMETZA) 1000 MG (MOD) 24 HR TABLET    Take 1,000 mg by mouth every morning with breakfast.        Review of Systems   Constitutional: Negative for fever.   Respiratory: Negative for cough and shortness of breath.    Cardiovascular: Positive for chest pain and near-syncope. Negative for palpitations, orthopnea, syncope and PND.   Gastrointestinal: Negative for nausea and abdominal pain.   Neurological: Positive for dizziness and weakness.   All other systems reviewed and are negative.      Physical Exam    BP: 229/112 mmHg, Heart Rate: 100, Temp: 98.8 F (37.1 C), Resp Rate: 16, SpO2: 98 %, Weight: 110.224 kg    Physical Exam   Constitutional: She is oriented to person, place, and time. She appears well-developed and well-nourished. No distress.   HENT:  Head: Normocephalic and atraumatic.   Nose: Nose normal.   Mouth/Throat: Oropharynx is clear and moist.   Eyes: Conjunctivae and EOM are normal. Pupils are equal, round, and reactive to light.   Neck: Normal range of motion. Neck supple. No JVD present.   Cardiovascular: Normal rate, regular rhythm, normal heart sounds and intact distal pulses.    No murmur heard.  Pulmonary/Chest: Effort normal and breath sounds normal. No respiratory distress. She has no wheezes. She has no rales. She exhibits no tenderness.   Abdominal: Soft. She exhibits no distension. There is no tenderness. There is no rebound and no guarding.   Musculoskeletal: Normal range of motion. She exhibits no edema.   Neurological: She is alert and oriented to person, place, and time. No cranial nerve deficit. She exhibits normal muscle tone. Coordination normal.   Skin: Skin is warm and dry. No rash noted.   Psychiatric: She has a normal mood and affect. Her behavior is normal. Judgment normal.       MDM and ED Course     ED Medication Orders    Start     Status Ordering Provider    12/18/13 1400   morphine injection 4 mg   Once     Route: Intravenous  Ordered Dose: 4 mg     Ordered Elinda Bunten G    12/18/13 1400  ondansetron (ZOFRAN) injection 4 mg   Once     Route: Intravenous  Ordered Dose: 4 mg     Last MAR action:  Given Amando Ishikawa G    12/18/13 1400  ondansetron (ZOFRAN) 4 MG/2ML injection     Comments:  Created by cabinet override    Ordered     12/18/13 1231  acetaminophen (TYLENOL) tablet 1,000 mg   Once     Route: Oral  Ordered Dose: 1,000 mg     Last MAR action:  Given Lark Runk G    12/18/13 1138  aspirin tablet 325 mg   Once     Route: Oral  Ordered Dose: 325 mg     Last MAR action:  Given Isami Mehra G    12/18/13 1137  nitroglycerin (NITROSTAT) SL tablet 0.4 mg   Every 5 min PRN     Route: Sublingual  Ordered Dose: 0.4 mg     Last MAR action:  Given Jenasia Dolinar G           MDM  Number of Diagnoses or Management Options  Chest pain, atypical:   Hypertensive urgency:   Diagnosis management comments: BP 229/112 mmHg  Pulse 100  Temp(Src) 98.8 F (37.1 C)  Resp 16  Ht 1.6 m  Wt 110.224 kg  BMI 43.06 kg/m2  SpO2 98%    EKG: read/interpret by EDMD: NSR @ 93, nml intervals, no STEMI    1300: Azad, MD accepts transfer    HDS, afebrile with dizziness and chest pain with elevated blood pressure in person with multiple risk factors for CAD.  Pain and BP improved with NG; ASA in ED. Trop negative, but given onset of symptoms < 2 hours prior to presentation will admit for further evaluation, rule out and risk stratification.  Patient agrees with plan of care.  Deforest Hoyles, MD aware of transfer to Telemetry, obs.    Addendum:  1350: prior to transport, asked by EMS to evaluate the patient; patient's pain, upon presentation has resolved, she is no longer having chest pain or pressure.  She does complain of palpitations and LEFT  arm pain (left arm pain was present upon initial presentation). Her vitals have remained stable, she has had no events on Tele and her initial TROP was negative. She  has received ASA, NG and tylenol; NG relieved her chest pain and pressure, and lowered her BP appropriately.  With her left arm pain still persisting, she was offered morphine for pain relief prior to transport out, otherwise the patient is stable for transfer to [facility] for further inpatient obs and care.    Results for orders placed or performed during the hospital encounter of 12/18/13  -CBC and differential       Result                                            Value                         Ref Range                       WBC                                               6.10                          3.50 - 10.80 x10 3/uL           RBC                                               5.31                          4.20 - 5.40 x10 6/uL            Hgb                                               12.5                          12.0 - 16.0 g/dL                Hematocrit                                        39.6                          37.0 - 47.0 %                   MCV  74.6 (L)                      80.0 - 100.0 fL                 MCH                                               23.5 (L)                      28.0 - 32.0 pg                  MCHC                                              31.6 (L)                      32.0 - 36.0 g/dL                RDW                                               17 (H)                        12 - 15 %                       Platelets                                         252                           140 - 400 x10 3/uL              MPV                                               12.4 (H)                      9.4 - 12.3 fL                   Neutrophils                                       58                            None %                          Lymphocytes Automated  32                            None %                          Monocytes                                         7                              None %                          Eosinophils Automated                             3                             None %                          Basophils Automated                               0                             None %                          Immature Granulocyte                              Unmeasured                    None %                          Nucleated RBC                                     Unmeasured                    0 - 1 /100 WBC                  Neutrophils Absolute                              3.56                          1.80 - 8.10 x10 3/uL            Abs Lymph Automated                               1.95  0.50 - 4.40 x10 3/uL            Abs Mono Automated                                0.42                          0.00 - 1.20 x10 3/uL            Abs Eos Automated                                 0.16                          0.00 - 0.70 x10 3/uL            Absolute Baso Automated                           0.01                          0.00 - 0.20 x10 3/uL            Absolute Immature Granulocyte                     Unmeasured                    0 x10 3/uL                 -Prothrombin time/INR       Result                                            Value                         Ref Range                       PT                                                13.3                          12.6 - 15.0 sec                 PT INR                                            1.0                           0.9 - 1.1                       PT Anticoag. Given Within 48 hrs.  None                                                     -Comprehensive metabolic panel       Result                                            Value                         Ref Range                       Glucose                                           112 (H)                       70 - 100 mg/dL                  BUN                                               11.0                           7.0 - 19.0 mg/dL                Creatinine                                        1.2 (H)                       0.6 - 1.0 mg/dL                 Sodium                                            142                           136 - 145 mEq/L                 Potassium                                         4.2                           3.5 - 5.1 mEq/L                 Chloride  109                           100 - 111 mEq/L                 CO2                                               23                            22 - 29 mEq/L                   CALCIUM                                           9.0                           8.5 - 10.5 mg/dL                Protein, Total                                    7.4                           6.0 - 8.3 g/dL                  Albumin                                           3.6                           3.5 - 5.0 g/dL                  AST (SGOT)                                        19                            5 - 34 U/L                      ALT                                               22                            0 - 55 U/L  Alkaline Phosphatase                              121 (H)                       37 - 106 U/L                    Bilirubin, Total                                  0.7                           0.2 - 1.2 mg/dL                 Globulin                                          3.8 (H)                       2.0 - 3.6 g/dL                  Albumin/Globulin Ratio                            0.9                           0.9 - 2.2                       Anion Gap                                         10.0                          5.0 - 15.0                 -B-type Natriuretic Peptide       Result                                            Value                         Ref Range                       B-Natriuretic Peptide                             19                            0 -  100 pg/mL              -Lipase  Result                                            Value                         Ref Range                       Lipase                                            9                             8 - 78 U/L                 -Troponin I       Result                                            Value                         Ref Range                       Troponin I                                        0.02                          0.00 - 0.09 ng/mL          -GFR       Result                                            Value                         Ref Range                       EGFR                                              45.9                                                       Results for orders placed or performed during the hospital encounter of 12/18/13  -XR Chest  AP Portable  Narrative    INDICATION: Chest pain        COMPARISON: No prior        FINDINGS:  A single radiograph of the chest performed. Portable film.    The heart is normal in size.     The mediastinal and hilar structures are within normal limits.    The lung fields raises the question of atelectasis or infiltrate at the    right lung base.Marland Kitchen No pleural effusion.    No pneumothorax.      The  visualized osseous structures demonstrates no acute abnormality.                                                                                                                        Impression     Question atelectasis or infiltrate at the right lung base        Kinnie Feil, MD     12/18/2013 12:03 PM             Amount and/or Complexity of Data Reviewed  Clinical lab tests: reviewed  Tests in the radiology section of CPT: reviewed  Discuss the patient with other providers: yes    Patient Progress  Patient progress: improved        Procedures    Clinical Impression & Disposition     Clinical  Impression  Final diagnoses:   Chest pain, atypical   Hypertensive urgency        ED Disposition    Observation Admitting Physician: AZAD, ATEFEH [[id number]]  Diagnosis: Chest pain [[id number]]  Estimated Length of Stay: < 2 midnights  Tentative Discharge Plan?: Home or Self Care [1]  Patient Class: Observation [104]             New Prescriptions    No medications on file                 Windell Hummingbird, DO  12/18/13 1306    Windell Hummingbird, DO  12/18/13 [phone]

## 2013-12-18 NOTE — ED Notes (Signed)
Pt had sudden onset of chest pain less than one hour ago, pt reports she had sudden dizziness at the same time.  Pt reports that she had the same sx two days ago and thought it was because she had not eaten.  Pt also stated she has been having on and off palpitations.  Pt also had some dizziness yesterday but not as severe as the dizziness today.  Pt is in no distress at this time.

## 2013-12-18 NOTE — Plan of Care (Signed)
Problem: Health Promotion  Goal: Knowledge - disease process  Extent of understanding conveyed about a specific disease process.   Outcome: Progressing  Goal: Knowledge - health resources  Extent of understanding and conveyed about healthcare resources.   Outcome: Progressing    Problem: Safety  Goal: Patient will be free from injury during hospitalization  Outcome: Progressing    Problem: Pain  Goal: Patient's pain/discomfort is manageable  Outcome: Progressing    Problem: Psychosocial and Spiritual Needs  Goal: Demonstrates ability to cope with hospitalization/illness  Outcome: Progressing    Problem: Moderate/High Fall Risk Score >/=15  Goal: Patient will remain free of falls  Outcome: Progressing    Problem: Chest Pain  Goal: Vital signs and cardiac rhythm stable  Outcome: Progressing  Goal: Pain at/below Pt Goal: Patient comfortable  Outcome: Progressing  Goal: Anxiety managed/Pt demonstrates effective coping  Outcome: Progressing  Goal: Patient/Patient Care Companion demonstrates understanding of disease process, treatment plan, medications, and discharge plan  Outcome: Progressing

## 2013-12-18 NOTE — ED Notes (Signed)
Patient in no acute distress prior to transfer. RR even and unlabored, skin pink warm and dry. Patient alert and oriented x4. Patient sinus rhythm on the monitor. Patient and family have no questions or concerns with transfer. Hand off given to PTS medic.

## 2013-12-18 NOTE — ED Notes (Signed)
Assumed patient care from [facility]. Bedside hand off given. Patient resting at this time.  States she generally does not feel well.  Offered patient a warm meal, patient agreeable however denies appetite. Patient [phone] on the monitor, rr even and unlabored, skin warm and dry. Patient aware of current plan of care.

## 2013-12-18 NOTE — ED Notes (Signed)
PTS notifies this nurse that pt sts "My pain is still 7/10 and hasn't gotten any better since my arrival and I still feel palpatations"  Dr. Margaretha Glassing notified and at pt's bedside, offers to give pt Morphine refused by the pt.  NSR on monitor with no ectopy, VSS.  PTS continues to package pt for transfer.

## 2013-12-18 NOTE — ED Notes (Signed)
Pt has pain in left arm and reports "tight" feeling in chest.  BP has been reduced.  Pt is aaox3 and in no distress.  Pt remains monitored with call bell in reach.

## 2013-12-18 NOTE — Plan of Care (Signed)
Pt has been admitted to [facility] from [facility] with complaints of chest pain and left sided numbness.  Pt has been alert and oriented x 4.  No distress has been noted.    Pt has all belonging at bedside.  NSR on telemetry monitor.    Pt will be NPO after Midnight for Stress Test in am.  Pt also scheduled for Echocardiogram.  White board updated with plan of care.    Presently pt is resting comfortably in bed with call bell within reach.  Bed remains in lowest position.  Pt will continue to be monitored continually throughout the day.  All changes will be noted.

## 2013-12-18 NOTE — Progress Notes (Signed)
Severe Sepsis Screen    Date: 12/18/2013 Time: 5:46 PM  Nurse Signature: Pecolia Ades    Exclusions:      Patients meeting the following criteria are excluded from screening:     []  Suspicion or diagnosis of sepsis is documented and until 72 hours after antibiotics started or last regimen change:   - If Yes, Date of Documented Sepsis:                                          - If Yes, Date of last change in antibiotics:                                           []  Surgery - No screening for 24 hours after surgery   - If Yes, Date of Surgery:                                           []  [place] hypothermia protocol- Resume screening when [employer] sun complete   []  Comfort Care Orders- Do not resume screening    Did you check any of the boxes above?     [x]  No, Continue to section A   []  Yes, Stop Here, Patient Excluded from Sepsis Screening. If screening should resume in the future, place "sticky note to treatment team" with date/time of when screening should resume. Communicate patient excluded from screening due to Comfort Care Orders using "sticky note to treatment team."    A. Infection:      Does your patient have ONE or more of the following infection criteria?     []  Documented Infection - Does the patient have positive culture results (from blood, sputum, urine, etc)?   []  Anti-Infective Therapy - Is the patient receiving antibiotic, antifungal, or other anti-infective therapy?   []  Pneumonia - Is there documentation of pneumonia (X Ray, etc)?   []  WBC's - Have WBC's been found in normally sterile fluid (urine, CSF, etc.)?   []  Perforated Viscus - Does the patient have a perforated hollow organ (bowel)?    A.  Did you check any of the boxes above?     [x]  No, Stop Here and Sepsis Screen Negative   []  Yes, continue to section B    B. SIRS:      Does your patient have TWO or more of the following SIRS criteria?     []  Temperature - Is the patient's temperature: Temp: 96.1 F (35.6 C) (12/18/13  1654)   - Greater than or equal to 38.3 degrees C (greater than 100.9 degrees F)?   - Less than or equal to 36 degrees C (less than or equal to 96.8 degrees F)?     []  Heart Rate: Heart Rate: 74 (12/18/13 1330)   - Is the patient's heart rate greater than or equal to 90 bpm?     []  Respiratory: Resp Rate: 18 (12/18/13 1654)   - Is the patient's respiratory rate greater than 20?     []  WBC Count - Is the patient's WBC count:   Recent Labs  Lab 12/18/13  1140   WBC 6.10       -  Greater than or equal to 12,000/mm3 OR   - Less than or equal to 4,000/mm3 OR    - Are there greater than 10% immature neutrophils (bands)?     []  Glucose >140 without diabetes or on steroids?   Recent Labs  Lab 12/18/13  1140   GLUCOSE 112*         []  Significant edema is present?    B.  Did you check two or more of the boxes above?     []  No, Stop Here and Sepsis Screen Negative   []  Yes, contact Charge Nurse, continue to section C    C. ACUTE Organ Dysfunction:      Does your patient have ONE or more of the following organ dysfunction? (May need to wait for lab results for assessment - see below) Organ dysfunction must be a result of the sepsis NOT CHRONIC conditions.     []  Cardiovascular - Does the patient have a: BP: 143/75 mmHg (12/18/13 1654)   - Systolic Blood Pressure less than or equal to 90 mmHg OR   - Systolic Blood Pressure has dropped 40 mmHg or more from baseline OR   - Mean Arterial Pressure less than or equal to 70 mmHg (for at least one hour despite fluid resuscitation OR   - require vasopressor support?     []  Respiratory - Does the patient have new hypoxia defined by any of the following?   - A sustained increase in oxygen requirements by at least 2L/min on NC or 28% FiO2 within the last 24 hrs OR   - A persistent decrease in oxygen saturation of greater than or equal to 5% lasting at least four or more hours and occurring within the last 24 hours     []  Renal - Does the patient have:   - low urine output (e.g.  Less than 0.5 mL/kg/HR for one hour despite adequate fluid resuscitation OR   - Increased creatinine (greater than 50% increase from baseline) OR   - require acute dialysis?     []  Hematologic - Does the patient have:   - Low platelet count (less than 100,000 mm3)   Recent Labs  Lab 12/18/13  1140   PLATELETS 252    OR   - INR/aPTT greater than upper limit of normal?   Recent Labs  Lab 12/18/13  1140   PT INR 1.0    OR No results for input(s): APTT in the last 168 hours.     []  Metabolic - Does the patient have a high lactate (plasma lactate greater than or equal to 2.4 mMol/L? No results for input(s): LACTATE in the last 168 hours.     []  Hepatic - Are the patient's liver enzymes elevated (ALT greater than 72 IU/L or Total Bilirubin greater than 2 MG/dL)?   Recent Labs  Lab 12/18/13  1140   BILIRUBIN, TOTAL 0.7   ALT 22         []  CNS - Does the patient have altered consciousness or reduced Glasgow Coma Scale?     Other Active Diagnoses that may be contributing to signs of end organ dysfunction (Ex. Chronic kidney disease, cirrhosis):   - ________________________________________________________________      C.  Did you check any of the boxes above?     []  No, Sepsis Screen Negative   []  YES:  A) Infection + B) SIRS + C) Acute Organ Dysfunction = Positive Screen for Severe Sepsis. Notify attending (house officer during off-hours).  Notify Attending/House Garment/textile technologist and document in Complex Assessment under provider notification   - Name of physician notified:                                           - Date/Time Notifiied:                                             Document actions:   _0  Lactate drawn   _1  Blood Cultures obtained   _2  Antibiotics initiated or modified   _3   IV Fluid administered 0.9% NS __________ mLs given   Nursing Comments/Narrative:    - ______________________________________________________________     The Surviving Sepsis Guidelines recommend the following interventions to be  completed within one hour of a positive sepsis screen.   Obtain new blood cultures prior to antibiotic administration if not done within the last 24 hours.   Obtain lactate level, if initial lactate > 47mol, repeat lactate in 2 hours for goal decrease 10-20%; If there is not a decrease call [facility], hospital  If SBP < 90 or MAP < 65 or lactate greater than 4 mmol/dl; Start 0.9% NS IV Fluid Bolus of 30 mL/Kg (minimum) to maintain MAP > 65    Initiate vasopressors for hypotension not responding to fluid resuscitation (1st line Norepinephrine 1 - 300 mcg/min IV) - Patient must be in CCU if requires vasopressor   Initiate or escalate antibiotic therapy   Goal urine output greater than/equal to 0.5 mL/kg/hr

## 2013-12-18 NOTE — H&P (Deleted)
ADMISSION HISTORY AND PHYSICAL EXAM    [facility], DIVISION OF HOSPITALIST MEDICINE   [facility]   [place] Pager: [phone]      Date Time: 12/18/2013 4:54 PM  Patient Name: Desiree Manning  Attending Physician: Theresia Lo, MD  Primary Care Physician: Kenyon Ana, MD (General)    CC: chest pain      Assessment:     Active Hospital Problems    Diagnosis   . Chest pain   . HTN (hypertension), malignant   . AKI (acute kidney injury)       58 years old female with past medical history of hypertension, diabetes mellitus presented to the emergency room secondary to  Chest pain .    Plan:   -Admit to [facility] Hospitalist service    Chest pain at rest is likely secondary to hypertension, malignancy  No ischemic change on EKG and initial cardiac enzymes are negative  - Continue to monitor on telemetry.  - Continue checking cardiac enzymes every 4-6 hours  -. Keep nothing by mouth after midnight for stress test  - Echocardiogram for evaluation of LV size and function  - Analgesic when necessary for chest pain  - .  Nitroglycerin when necessary for chest pain    Hypertension, malignancy 229 /112  - Patient takes Norvasc and lisinopril at home.  I continue Norvasc and a will hold ACE inh  secondary to acute kidney injury  - .  Labetalol PRN for blood pressure OVER 180     History of diabetic mellitus type II on metformin  - Held metformin, blood sugar is 112   -  obtain hemoglobin A1c    Acute kidney injury can be complication of bowel hypertension, malignancy  -  Hold ACE inhibitor  - Monitor bun/cr     HISTORY OF GASTRIC BYPASS SURGERY       Disposition:     Anticipated medical stability for discharge:24-48 H   Service status: Observation:patient is stable and improving.  Anticipated discharge needs:F/U care      History of Presenting Illness:   Desiree Manning is a 58 y.o. female who [facility]  with chest pain for one hour this morning , her pain was located on left  chest radiated to left shoulder , pain was dull , tight in nature , she denied any burning , throbbing or  squeezing pain , she never had her symptom in the past.  Her chest pain, pus progressive that prompted her to come to the emergency room for evaluation.  Patient denied any relieving factor , pain is not related to movement or deep breathing.  She denied any exacerbating factor Her symptom is associated with  dizziness, but she denied any syncope.  She does not have any abdominal pain.  She denied  Diaphoresis ,  nausea, vomiting, palpitation.  She denied fever or chills or cough.  Her initial EKG in the emergency room showed normal sinus rhythm at 93 be permanent, no ischemic change.  Her blood pressure was 229/112, her 1st troponin was negative.  She received aspirin, nitroglycerin and morphine and she was transferred to [facility]    She is originally from [place].  She worked in  ( last time she was home was 2 weeks ego) , Today she was driving back home. When chest pain started   She does have a history of diabetes mellitus, hypertension, obesity, status post gastric bypass surgery,  she does not is more than she is compliance to her medication. She stated that she has Nuclear stress test 2-3 years ego which was normal        Past Medical History:     Past Medical History   Diagnosis Date   . Hypertension    . Diabetes mellitus    . Migraine        Available old records reviewed, including: EPIC     Past Surgical History:     Past Surgical History   Procedure Laterality Date   . Gastric bypass         Family History:     Family History   Problem Relation Age of Onset   . Cancer Mother    . Heart disease Mother    . Diabetes Mother        Social History:     History   Smoking status   . Never Smoker    Smokeless tobacco   . Not on file     History   Alcohol Use No     History   Drug Use Not on file       Allergies:     Allergies   Allergen Reactions   . Percocet  [Oxycodone-Acetaminophen]        Medications:     No current facility-administered medications on file prior to encounter.     No current outpatient prescriptions on file prior to encounter.       Review of Systems:   All other systems were reviewed and are negative except:   Review of Systems - History obtained from the patient  Endocrine ROS: positive for - DM type 2 , on metformin , last A1C 6.2  Respiratory ROS: no cough, shortness of breath, or wheezing  Cardiovascular ROS: no chest pain or dyspnea on exertion  Gastrointestinal ROS: no abdominal pain, change in bowel habits, or black or bloody stools  Genito-Urinary ROS: no dysuria, trouble voiding, or hematuria  Musculoskeletal ROS: negative for - joint stiffness, joint swelling or muscle pain  Neurological ROS: no TIA or stroke symptoms    Physical Exam:   Patient Vitals for the past 24 hrs:   BP Temp Pulse Resp SpO2 Height Weight   12/18/13 1448 - - - - - 1.6 m (5\' 3" ) 107.865 kg (237 lb 12.8 oz)   12/18/13 1330 147/72 mmHg 97.6 F (36.4 C) 74 20 99 % - -   12/18/13 1259 - - 76 17 98 % - -   12/18/13 1230 - - 86 20 97 % - -   12/18/13 1200 159/76 mmHg - 96 15 96 % - -   12/18/13 1154 165/78 mmHg - 107 18 92 % - -   12/18/13 1150 189/93 mmHg - 104 35 93 % - -   12/18/13 1145 212/95 mmHg - 83 18 96 % - -   12/18/13 1134 229/112 mmHg 98.8 F (37.1 C) 100 16 98 % 1.6 m (5' 2.99") 110.224 kg (243 lb)     Body mass index is 42.13 kg/(m^2).  No intake or output data in the 24 hours ending 12/18/13 1654    General: awake; no acute distress.  HEENT: perrla, eomi, sclera anicteric  oropharynx clear without lesions, mucous membranes moist  Neck: supple, no lymphadenopathy, no JVD, no carotid bruits  Cardiovascular: Normal S1 and S2, no murmurs, rubs or gallops  Lungs: clear to auscultation bilaterally, without wheezing, rhonchi, or rales  Abdomen: soft,  non-tender, non-distended; no palpable masses, no hepatosplenomegaly, normoactive bowel sounds, no rebound or  guarding  Extremities: no clubbing, cyanosis, or edema  Neuro: alert, oriented x 3, cranial nerves grossly intact, strength 5/5 in upper and lower extremities, sensation intact,   Skin: no rashes or lesions noted  Other:       Labs:     Results    Procedure Component Value Units Date/Time    B-type Natriuretic Peptide [[id number]] Collected:  12/18/13 1150    Specimen Information:  Blood Updated:  12/18/13 1226     B-Natriuretic Peptide 19 pg/mL     Troponin I [[id number]] Collected:  12/18/13 1140    Specimen Information:  Blood Updated:  12/18/13 1218     Troponin I 0.02 ng/mL     GFR [[id number]] Collected:  12/18/13 1140     EGFR 45.9 Updated:  12/18/13 1217    Comprehensive metabolic panel [[id number]]  (Abnormal) Collected:  12/18/13 1140    Specimen Information:  Blood Updated:  12/18/13 1217     Glucose 112 (H) mg/dL      BUN 62.9 mg/dL      Creatinine 1.2 (H) mg/dL      Sodium 528 mEq/L      Potassium 4.2 mEq/L      Chloride 109 mEq/L      CO2 23 mEq/L      CALCIUM 9.0 mg/dL      Protein, Total 7.4 g/dL      Albumin 3.6 g/dL      AST (SGOT) 19 U/L      ALT 22 U/L      Alkaline Phosphatase 121 (H) U/L      Bilirubin, Total 0.7 mg/dL      Globulin 3.8 (H) g/dL      Albumin/Globulin Ratio 0.9      Anion Gap 10.0     Lipase [[id number]] Collected:  12/18/13 1140    Specimen Information:  Blood Updated:  12/18/13 1217     Lipase 9 U/L     CBC and differential [[id number]]  (Abnormal) Collected:  12/18/13 1140    Specimen Information:  Blood / Blood Updated:  12/18/13 1209     WBC 6.10 x10 3/uL      RBC 5.31 x10 6/uL      Hgb 12.5 g/dL      Hematocrit 03.4 %      MCV 74.6 (L) fL      MCH 23.5 (L) pg      MCHC 31.6 (L) g/dL      RDW 17 (H) %      Platelets 252 x10 3/uL      MPV 12.4 (H) fL      Neutrophils 58 %      Lymphocytes Automated 32 %      Monocytes 7 %      Eosinophils Automated 3 %      Basophils Automated 0 %      Immature Granulocyte Unmeasured %      Nucleated RBC Unmeasured /100 WBC      Neutrophils  Absolute 3.56 x10 3/uL      Abs Lymph Automated 1.95 x10 3/uL      Abs Mono Automated 0.42 x10 3/uL      Abs Eos Automated 0.16 x10 3/uL      Absolute Baso Automated 0.01 x10 3/uL      Absolute Immature Granulocyte Unmeasured x10 3/uL     Prothrombin time/INR [[id number]] Collected:  12/18/13 1140  Specimen Information:  Blood Updated:  12/18/13 1207     PT 13.3 sec      PT INR 1.0      PT Anticoag. Given Within 48 hrs. None         EKG normal sinus rhythm at rate 93 pbm .  Voltage criteria for left ventricular hypertrophy    Imaging personally reviewed, including:   Xr Chest  Ap Portable    12/18/2013    Question atelectasis or infiltrate at the right lung base  Kinnie Feil, MD  12/18/2013 12:03 PM       Safety Checklist  DVT prophylaxis:  CHEST guideline (See page e199S) Chemical and Mechanical   Foley: Not present   IVs:  Peripheral IV   PT/OT: Not needed       Signed by: Theresia Lo, MD   cc:Pcp, Heriberto Antigua, MD (General)

## 2013-12-19 ENCOUNTER — Observation Stay: Payer: BLUE CROSS/BLUE SHIELD

## 2013-12-19 DIAGNOSIS — D509 Iron deficiency anemia, unspecified: Secondary | ICD-10-CM | POA: Diagnosis present

## 2013-12-19 LAB — LIPID PANEL
Cholesterol / HDL Ratio: 7.1
Cholesterol: 235 mg/dL — ABNORMAL HIGH (ref 0–199)
HDL: 33 mg/dL — ABNORMAL LOW (ref >40)
LDL Calculated: 173 mg/dL — ABNORMAL HIGH (ref 0–99)
Triglycerides: 147 mg/dL (ref 34–149)
VLDL Calculated: 29 mg/dL (ref 10–40)

## 2013-12-19 LAB — CBC
Hematocrit: 38.4 % (ref 37.0–47.0)
Hgb: 11.7 g/dL — ABNORMAL LOW (ref 12.0–16.0)
MCH: 23.2 pg — ABNORMAL LOW (ref 28.0–32.0)
MCHC: 30.5 g/dL — ABNORMAL LOW (ref 32.0–36.0)
MCV: 76 fL — ABNORMAL LOW (ref 80.0–100.0)
MPV: 11.8 fL (ref 9.4–12.3)
Nucleated RBC: 0 /100 WBC (ref 0–1)
Platelets: 235 10*3/uL (ref 140–400)
RBC: 5.05 10*6/uL (ref 4.20–5.40)
RDW: 17 % — ABNORMAL HIGH (ref 12–15)
WBC: 5.98 10*3/uL (ref 3.50–10.80)

## 2013-12-19 LAB — BASIC METABOLIC PANEL
BUN: 11 mg/dL (ref 7–19)
CO2: 26 mEq/L (ref 22–29)
Calcium: 8.7 mg/dL (ref 8.5–10.5)
Chloride: 108 mEq/L (ref 100–111)
Creatinine: 1.1 mg/dL — ABNORMAL HIGH (ref 0.6–1.0)
Glucose: 100 mg/dL (ref 70–100)
Potassium: 4.5 mEq/L (ref 3.5–5.1)
Sodium: 141 mEq/L (ref 136–145)

## 2013-12-19 LAB — ECG 12-LEAD
Atrial Rate: 93 {beats}/min
P Axis: 56 degrees
P-R Interval: 136 ms
Q-T Interval: 348 ms
QRS Duration: 72 ms
QTC Calculation (Bezet): 432 ms
R Axis: -5 degrees
T Axis: 80 degrees
Ventricular Rate: 93 {beats}/min

## 2013-12-19 LAB — GFR: EGFR: >60

## 2013-12-19 LAB — HEMOLYSIS INDEX: Hemolysis Index: 5 (ref 0–18)

## 2013-12-19 LAB — TROPONIN I: Troponin I: <0.01 ng/mL (ref 0.00–0.09)

## 2013-12-19 MED ORDER — LISINOPRIL 20 MG PO TABS
40.0000 mg | ORAL_TABLET | Freq: Every day | ORAL | Status: DC
Start: 2013-12-19 — End: 2013-12-20
  Administered 2013-12-19: 40 mg via ORAL
  Filled 2013-12-19: qty 2

## 2013-12-19 MED ORDER — ROSUVASTATIN CALCIUM 10 MG PO TABS
10.0000 mg | ORAL_TABLET | Freq: Every evening | ORAL | Status: DC
Start: 2013-12-19 — End: 2013-12-20
  Administered 2013-12-19: 10 mg via ORAL
  Filled 2013-12-19: qty 1

## 2013-12-19 MED ORDER — NIFEDIPINE ER OSMOTIC RELEASE 30 MG PO TB24
30.0000 mg | ORAL_TABLET | Freq: Every day | ORAL | Status: DC
Start: 2013-12-19 — End: 2013-12-20
  Administered 2013-12-19 – 2013-12-20 (×2): 30 mg via ORAL
  Filled 2013-12-19 (×2): qty 1

## 2013-12-19 MED ORDER — ASPIRIN 81 MG PO CHEW
81.0000 mg | CHEWABLE_TABLET | Freq: Every day | ORAL | Status: DC
Start: 2013-12-19 — End: 2013-12-20
  Administered 2013-12-19 – 2013-12-20 (×2): 81 mg via ORAL
  Filled 2013-12-19 (×2): qty 1

## 2013-12-19 MED ORDER — HYDROCHLOROTHIAZIDE 12.5 MG PO TABS
12.5000 mg | ORAL_TABLET | Freq: Every day | ORAL | Status: DC
Start: 2013-12-19 — End: 2013-12-19
  Filled 2013-12-19: qty 1

## 2013-12-19 NOTE — H&P (Addendum)
H/P deleted by me accidentally  Please review the original  H/P note  For detail   Below is the copy of the note     [place] Pager: [phone]      Date Time: 12/18/2013 4:54 PM  Patient Name: Desiree Manning  Attending Physician: Theresia Lo, MD  Primary Care Physician: Kenyon Ana, MD (General)    CC: chest pain    Assessment:    Active Hospital Problems     Diagnosis    .  Chest pain    .  HTN (hypertension), malignant    .  AKI (acute kidney injury)        58 years old female with past medical history of hypertension, diabetes mellitus presented to the emergency room secondary to Chest pain .  Plan:    -Admit to [facility] Hospitalist service    Chest pain at rest is likely secondary to hypertension, malignancy  No ischemic change on EKG and initial cardiac enzymes are negative  - Continue to monitor on telemetry.  - Continue checking cardiac enzymes every 4-6 hours  -. Keep nothing by mouth after midnight for stress test  - Echocardiogram for evaluation of LV size and function  - Analgesic when necessary for chest pain  - . Nitroglycerin when necessary for chest pain    Hypertension, malignancy 229 /112  - Patient takes Norvasc and lisinopril at home. I continue Norvasc and a will hold ACE inh secondary to acute kidney injury  - . Labetalol PRN for blood pressure OVER 180     History of diabetic mellitus type II on metformin  - Held metformin, blood sugar is 112   - obtain hemoglobin A1c    Acute kidney injury can be complication of bowel hypertension, malignancy  - Hold ACE inhibitor  - Monitor bun/cr     HISTORY OF GASTRIC BYPASS SURGERY     Disposition:      Anticipated medical stability for discharge:24-48 H   Service status: Observation:patient is stable and improving.  Anticipated discharge needs:F/U care    History of Presenting Illness:    Desiree Manning is a 58 y.o. female who [facility] pain for one hour this morning , her pain was located on left chest radiated to  left shoulder , pain was dull , tight in nature , she denied any burning , throbbing or squeezing pain , she never had her symptom in the past. Her chest pain, pus progressive that prompted her to come to the emergency room for evaluation. Patient denied any relieving factor , pain is not related to movement or deep breathing. She denied any exacerbating factor Her symptom is associated with dizziness, but she denied any syncope. She does not have any abdominal pain. She denied Diaphoresis , nausea, vomiting, palpitation. She denied fever or chills or cough. Her initial EKG in the emergency room showed normal sinus rhythm at 93 be permanent, no ischemic change. Her blood pressure was 229/112, her 1st troponin was negative. She received aspirin, nitroglycerin and morphine and she was transferred to [facility]    She is originally from [place]. She worked in [place] ( last time she was home was 2 weeks ego) , Today she was driving back home. When chest pain started She does have a history of diabetes mellitus, hypertension, obesity, status post gastric bypass surgery, she does not is more than she is compliance to her medication. She stated that she has  Nuclear stress test 2-3 years ego which was normal      Past Medical History:     Past Medical History    Past Medical History    Diagnosis  Date    .  Hypertension     .  Diabetes mellitus     .  Migraine            Available old records reviewed, including: EPIC   Past Surgical History:     Past Surgical History    Past Surgical History    Procedure  Laterality  Date    .  Gastric bypass           Family History:     Family History    Family History    Problem  Relation  Age of Onset    .  Cancer  Mother     .  Heart disease  Mother     .  Diabetes  Mother          Social History:    History    Smoking status    .  Never Smoker    Smokeless tobacco    .  Not on file      History    Alcohol Use  No      History    Drug Use  Not on file       Allergies:    Allergies    Allergen  Reactions    .  Percocet [Oxycodone-Acetaminophen]       Medications:    No current facility-administered medications on file prior to encounter.    No current outpatient prescriptions on file prior to encounter.      Review of Systems:    All other systems were reviewed and are negative except:  Review of Systems - History obtained from the patient  Endocrine ROS: positive for - DM type 2 , on metformin , last A1C 6.2  Respiratory ROS: no cough, shortness of breath, or wheezing  Cardiovascular ROS: no chest pain or dyspnea on exertion  Gastrointestinal ROS: no abdominal pain, change in bowel habits, or black or bloody stools  Genito-Urinary ROS: no dysuria, trouble voiding, or hematuria  Musculoskeletal ROS: negative for - joint stiffness, joint swelling or muscle pain  Neurological ROS: no TIA or stroke symptoms  Physical Exam:    Patient Vitals for the past 24 hrs:   BP  Temp  Pulse  Resp  SpO2  Height  Weight    12/18/13 1448  -  -  -  -  -  1.6 m (5\' 3" )  107.865 kg (237 lb 12.8 oz)    12/18/13 1330  147/72 mmHg  97.6 F (36.4 C)  74  20  99 %  -  -    12/18/13 1259  -  -  76  17  98 %  -  -    12/18/13 1230  -  -  86  20  97 %  -  -    12/18/13 1200  159/76 mmHg  -  96  15  96 %  -  -    12/18/13 1154  165/78 mmHg  -  107  18  92 %  -  -    12/18/13 1150  189/93 mmHg  -  104  35  93 %  -  -    12/18/13 1145  212/95 mmHg  -  83  18  96 %  -  -  12/18/13 1134  229/112 mmHg  98.8 F (37.1 C)  100  16  98 %  1.6 m (5' 2.99")  110.224 kg (243 lb)      Body mass index is 42.13 kg/(m^2).  No intake or output data in the 24 hours ending 12/18/13 1654    General: awake; no acute distress.  HEENT: perrla, eomi, sclera anicteric oropharynx clear without lesions, mucous membranes moist  Neck: supple, no lymphadenopathy, no JVD, no carotid bruits  Cardiovascular: Normal S1 and S2, no murmurs, rubs or gallops  Lungs: clear to auscultation bilaterally, without wheezing, rhonchi,  or rales  Abdomen: soft, non-tender, non-distended; no palpable masses, no hepatosplenomegaly, normoactive bowel sounds, no rebound or guarding  Extremities: no clubbing, cyanosis, or edema  Neuro: alert, oriented x 3, cranial nerves grossly intact, strength 5/5 in upper and lower extremities, sensation intact,   Skin: no rashes or lesions noted  Other:     Labs:    Results     Procedure  Component  Value  Units  Date/Time     B-type Natriuretic Peptide [[id number]]  Collected: 12/18/13 1150     Specimen Information: Blood  Updated: 12/18/13 1226      B-Natriuretic Peptide  19  pg/mL      Troponin I [[id number]]  Collected: 12/18/13 1140     Specimen Information: Blood  Updated: 12/18/13 1218      Troponin I  0.02  ng/mL      GFR [[id number]]  Collected: 12/18/13 1140      EGFR  45.9  Updated: 12/18/13 1217     Comprehensive metabolic panel [[id number]] (Abnormal)  Collected: 12/18/13 1140     Specimen Information: Blood  Updated: 12/18/13 1217      Glucose  112 (H)  mg/dL       BUN  62.9  mg/dL       Creatinine  1.2 (H)  mg/dL       Sodium  528  mEq/L       Potassium  4.2  mEq/L       Chloride  109  mEq/L       CO2  23  mEq/L       CALCIUM  9.0  mg/dL       Protein, Total  7.4  g/dL       Albumin  3.6  g/dL       AST (SGOT)  19  U/L       ALT  22  U/L       Alkaline Phosphatase  121 (H)  U/L       Bilirubin, Total  0.7  mg/dL       Globulin  3.8 (H)  g/dL       Albumin/Globulin Ratio  0.9       Anion Gap  10.0      Lipase [[id number]]  Collected: 12/18/13 1140     Specimen Information: Blood  Updated: 12/18/13 1217      Lipase  9  U/L      CBC and differential [[id number]] (Abnormal)  Collected: 12/18/13 1140     Specimen Information: Blood / Blood  Updated: 12/18/13 1209      WBC  6.10  x10 3/uL       RBC  5.31  x10 6/uL       Hgb  12.5  g/dL       Hematocrit  03.4  %       MCV  74.6 (  L)  fL       MCH  23.5 (L)  pg       MCHC  31.6 (L)  g/dL       RDW  17 (H)  %       Platelets  252  x10 3/uL       MPV  12.4 (H)  fL        Neutrophils  58  %       Lymphocytes Automated  32  %       Monocytes  7  %       Eosinophils Automated  3  %       Basophils Automated  0  %       Immature Granulocyte  Unmeasured  %       Nucleated RBC  Unmeasured  /100 WBC       Neutrophils Absolute  3.56  x10 3/uL       Abs Lymph Automated  1.95  x10 3/uL       Abs Mono Automated  0.42  x10 3/uL       Abs Eos Automated  0.16  x10 3/uL       Absolute Baso Automated  0.01  x10 3/uL       Absolute Immature Granulocyte  Unmeasured  x10 3/uL      Prothrombin time/INR [[id number]]  Collected: 12/18/13 1140     Specimen Information: Blood  Updated: 12/18/13 1207      PT  13.3  sec       PT INR  1.0       PT Anticoag. Given Within 48 hrs.  None          EKG normal sinus rhythm at rate 93 pbm . Voltage criteria for left ventricular hypertrophy    Imaging personally reviewed, including:   Xr Chest Ap Portable    12/18/2013 Question atelectasis or infiltrate at the right lung base Kinnie Feil, MD 12/18/2013 12:03 PM       Safety Checklist  DVT prophylaxis:  CHEST guideline (See page e199S)  Chemical and Mechanical    Foley:  Not present    IVs:   Peripheral IV    PT/OT:  Not needed        Signed by: Theresia Lo, MD   cc:Pcp, Heriberto Antigua, MD (General)

## 2013-12-19 NOTE — Progress Notes (Signed)
INTERNAL MEDICINE PROGRESS NOTE  [facility], Division of Hospitalist Medicine  [facility]  [place] pager: [phone]      Date Time: 12/19/2013 9:44 AM  Patient Name: Desiree Manning  Attending Physician: Theresia Lo, MD    Assessment:   Active Problems:    Microcytic anemia  Resolved Problems:    Chest pain    HTN (hypertension), malignant    AKI (acute kidney injury)    58 yo HTN, DM , came in for chest pain and HTN malignancy     Plan:   Chest pain at rest is likely secondary to hypertension, malignancy  No ischemic change on EKG and cardiac enzymes are negative  - Continue to monitor on telemetry.  - Exercise stress test today   - Echocardiogram for evaluation of LV size and function  - Analgesic when necessary for chest pain  - . Nitroglycerin when necessary for chest pain    Hypertension, malignancy 229 /112  - Patient takes Norvasc and lisinopril at home.  - . Labetalol PRN for blood pressure OVER 180   - Change norvasc to procardia   - resume lisinopril     History of diabetic mellitus type II on metformin  - Held metformin, blood sugar is 112   - obtain hemoglobin A1c    Acute kidney injury can be complication of  hypertension, malignancy: resolved     Hyperlipidemia   Start statin     Generalize weakness , dizziness can be secondary to HTN malignancy , no focal neurological exam   - will get CT scan of head to ro stroke as her symptome persisted     HISTORY OF GASTRIC BYPASS SURGERY  -mild microcytic anemia ,next colonoscopy next year     Case discussed with: pt and RN   Subjective/24 hour events:   PT stated the  she does not  feel good , still feel dizzy and has  headache , no chest pain at this time    Last chest pain was at 4 am , last few min     Patient Vitals for the past 12 hrs:   BP Temp Pulse Resp   12/19/13 0700 191/81 mmHg 97.6 F (36.4 C) - 18   12/19/13 0348 143/77 mmHg 96.6 F (35.9 C) 58 18   12/18/13 2309 153/81 mmHg 96.4 F (35.8 C) 62 18       Safety  checklist:   DVT prophylaxis:Lovenox  Foley catheter:None   IV access: Peripheral   PT/OT:NA  Daily labs : Ordered    Medications:       enoxaparin 40 mg Daily at 1800   lisinopril 40 mg Daily   morphine 4 mg Once   NIFEdipine XL 30 mg Daily   rosuvastatin 10 mg QHS     PRN   Medication Dose Route   . acetaminophen  650 mg Oral   . bisacodyl  5 mg Oral   . diphenhydrAMINE  25 mg Oral    Or   . diphenhydrAMINE  12.5 mg Intravenous   . HYDROmorphone  0.5 mg Intravenous   . ibuprofen  600 mg Oral   . labetalol  10 mg Intravenous   . naloxone  0.2 mg Intravenous   . nitroglycerin  0.4 mg Sublingual   . senna-docusate  2 tablet Oral     Physical exam:   Temp:  [96.1 F (35.6 C)-98.8 F (37.1 C)] 97.6 F (36.4 C)  Heart Rate:  [58-107] 58  Resp Rate:  [15-35] 18  BP: (143-229)/(72-112) 191/81 mmHg  No intake or output data in the 24 hours ending 12/19/13 0944  General: Awake, alert, oriented, no apparent distress.[email or web address]   Cardiovascular: Regular rate and rhythm. No murmurs, gallops or rubs noted.  Lungs: Clear to auscultation bilaterally. No wheezing, crackles or rhonchi noted.  Abdomen: Soft, non-tender, non-distended. No organomegaly or masses noted. Normal bowel sounds. No guarding or rebound tenderness noted.  Extremities: No edema noted. 2+ pulses throughout.  Neuro: Non-focal neurological exam.  Other:     Labs (last 72 hours):       Recent Labs  Lab 12/19/13  0515 12/18/13  1140   WBC 5.98 6.10   HEMOGLOBIN 11.7* 12.5   HEMATOCRIT 38.4 39.6   PLATELETS 235 252       Recent Labs  Lab 12/19/13  0515 12/18/13  1140   SODIUM 141 142   POTASSIUM 4.5 4.2   CHLORIDE 108 109   CO2 26 23   BUN 11 11.0   CREATININE 1.1* 1.2*   CALCIUM 8.7 9.0   ALBUMIN  --  3.6   PROTEIN, TOTAL  --  7.4   BILIRUBIN, TOTAL  --  0.7   ALKALINE PHOSPHATASE  --  121*   ALT  --  22   AST (SGOT)  --  19   GLUCOSE 100 112*       Recent Labs  Lab 12/18/13  1140   PT 13.3   PT INR 1.0       Radiology:     Radiology Results (24 Hour)    Procedure  Component Value Units Date/Time    XR Chest  AP Portable [[id number]] Collected:  12/18/13 1203    Order Status:  Completed Updated:  12/18/13 1207    Narrative:      INDICATION: Chest pain    COMPARISON: No prior    FINDINGS:  A single radiograph of the chest performed. Portable film.  The heart is normal in size.   The mediastinal and hilar structures are within normal limits.  The lung fields raises the question of atelectasis or infiltrate at the  right lung base.Marland Kitchen No pleural effusion.  No pneumothorax.    The  visualized osseous structures demonstrates no acute abnormality.      Impression:       Question atelectasis or infiltrate at the right lung base    Kinnie Feil, MD   12/18/2013 12:03 PM            Signed by: Theresia Lo, MD  Date/time: 12/19/2013 9:44 AM

## 2013-12-19 NOTE — Progress Notes (Signed)
Severe Sepsis Screen    Date: 12/19/2013 Time: 3:09 PM  Nurse Signature: Pecolia Ades    Exclusions:      Patients meeting the following criteria are excluded from screening:     []  Suspicion or diagnosis of sepsis is documented and until 72 hours after antibiotics started or last regimen change:   - If Yes, Date of Documented Sepsis:                                          - If Yes, Date of last change in antibiotics:                                           []  Surgery - No screening for 24 hours after surgery   - If Yes, Date of Surgery:                                           []  Arctic Sun hypothermia protocol- Resume screening when arctic sun complete   []  Comfort Care Orders- Do not resume screening    Did you check any of the boxes above?     [x]  No, Continue to section A   []  Yes, Stop Here, Patient Excluded from Sepsis Screening. If screening should resume in the future, place "sticky note to treatment team" with date/time of when screening should resume. Communicate patient excluded from screening due to Comfort Care Orders using "sticky note to treatment team."    A. Infection:      Does your patient have ONE or more of the following infection criteria?     []  Documented Infection - Does the patient have positive culture results (from blood, sputum, urine, etc)?   []  Anti-Infective Therapy - Is the patient receiving antibiotic, antifungal, or other anti-infective therapy?   []  Pneumonia - Is there documentation of pneumonia (X Ray, etc)?   []  WBC's - Have WBC's been found in normally sterile fluid (urine, CSF, etc.)?   []  Perforated Viscus - Does the patient have a perforated hollow organ (bowel)?    A.  Did you check any of the boxes above?     [x]  No, Stop Here and Sepsis Screen Negative   []  Yes, continue to section B    B. SIRS:      Does your patient have TWO or more of the following SIRS criteria?     []  Temperature - Is the patient's temperature: Temp: 97.7 F (36.5 C) (12/19/13  1302)   - Greater than or equal to 38.3 degrees C (greater than 100.9 degrees F)?   - Less than or equal to 36 degrees C (less than or equal to 96.8 degrees F)?     []  Heart Rate: Heart Rate: 58 (12/19/13 0348)   - Is the patient's heart rate greater than or equal to 90 bpm?     []  Respiratory: Resp Rate: 16 (12/19/13 1302)   - Is the patient's respiratory rate greater than 20?     []  WBC Count - Is the patient's WBC count:   Recent Labs  Lab 12/19/13  0515   WBC 5.98       -  Greater than or equal to 12,000/mm3 OR   - Less than or equal to 4,000/mm3 OR    - Are there greater than 10% immature neutrophils (bands)?     []  Glucose >140 without diabetes or on steroids?   Recent Labs  Lab 12/19/13  0515   GLUCOSE 100         []  Significant edema is present?    B.  Did you check two or more of the boxes above?     []  No, Stop Here and Sepsis Screen Negative   []  Yes, contact Charge Nurse, continue to section C    C. ACUTE Organ Dysfunction:      Does your patient have ONE or more of the following organ dysfunction? (May need to wait for lab results for assessment - see below) Organ dysfunction must be a result of the sepsis NOT CHRONIC conditions.     []  Cardiovascular - Does the patient have a: BP: 134/78 mmHg (12/19/13 1302)   - Systolic Blood Pressure less than or equal to 90 mmHg OR   - Systolic Blood Pressure has dropped 40 mmHg or more from baseline OR   - Mean Arterial Pressure less than or equal to 70 mmHg (for at least one hour despite fluid resuscitation OR   - require vasopressor support?     []  Respiratory - Does the patient have new hypoxia defined by any of the following?   - A sustained increase in oxygen requirements by at least 2L/min on NC or 28% FiO2 within the last 24 hrs OR   - A persistent decrease in oxygen saturation of greater than or equal to 5% lasting at least four or more hours and occurring within the last 24 hours     []  Renal - Does the patient have:   - low urine output (e.g.  Less than 0.5 mL/kg/HR for one hour despite adequate fluid resuscitation OR   - Increased creatinine (greater than 50% increase from baseline) OR   - require acute dialysis?     []  Hematologic - Does the patient have:   - Low platelet count (less than 100,000 mm3)   Recent Labs  Lab 12/19/13  0515   PLATELETS 235    OR   - INR/aPTT greater than upper limit of normal?   Recent Labs  Lab 12/18/13  1140   PT INR 1.0    OR No results for input(s): APTT in the last 168 hours.     []  Metabolic - Does the patient have a high lactate (plasma lactate greater than or equal to 2.4 mMol/L? No results for input(s): LACTATE in the last 168 hours.     []  Hepatic - Are the patient's liver enzymes elevated (ALT greater than 72 IU/L or Total Bilirubin greater than 2 MG/dL)?   Recent Labs  Lab 12/18/13  1140   BILIRUBIN, TOTAL 0.7   ALT 22         []  CNS - Does the patient have altered consciousness or reduced Glasgow Coma Scale?     Other Active Diagnoses that may be contributing to signs of end organ dysfunction (Ex. Chronic kidney disease, cirrhosis):   - ________________________________________________________________      C.  Did you check any of the boxes above?     []  No, Sepsis Screen Negative   []  YES:  A) Infection + B) SIRS + C) Acute Organ Dysfunction = Positive Screen for Severe Sepsis. Notify attending (house officer during off-hours).  Notify Attending/House Garment/textile technologist and document in Complex Assessment under provider notification   - Name of physician notified:                                           - Date/Time Notifiied:                                             Document actions:   _0  Lactate drawn   _1  Blood Cultures obtained   _2  Antibiotics initiated or modified   _3   IV Fluid administered 0.9% NS __________ mLs given   Nursing Comments/Narrative:    - ______________________________________________________________     The Surviving Sepsis Guidelines recommend the following interventions to be  completed within one hour of a positive sepsis screen.   Obtain new blood cultures prior to antibiotic administration if not done within the last 24 hours.   Obtain lactate level, if initial lactate > 47mol, repeat lactate in 2 hours for goal decrease 10-20%; If there is not a decrease call [facility], hospital  If SBP < 90 or MAP < 65 or lactate greater than 4 mmol/dl; Start 0.9% NS IV Fluid Bolus of 30 mL/Kg (minimum) to maintain MAP > 65    Initiate vasopressors for hypotension not responding to fluid resuscitation (1st line Norepinephrine 1 - 300 mcg/min IV) - Patient must be in CCU if requires vasopressor   Initiate or escalate antibiotic therapy   Goal urine output greater than/equal to 0.5 mL/kg/hr

## 2013-12-19 NOTE — Progress Notes (Signed)
The patient and or patient's representative was informed both orally and in writing that they are placed in Observation/Outpatient status within 24 hours after the order was entered.  A copy of the written notification was placed in the shadow chart.

## 2013-12-19 NOTE — Progress Notes (Signed)
Severe Sepsis Screen    Date: 12/19/2013 Time: 1:39 AM  Nurse Signature: Donneta Romberg    Exclusions:      Patients meeting the following criteria are excluded from screening:     []  Suspicion or diagnosis of sepsis is documented and until 72 hours after antibiotics started or last regimen change:   - If Yes, Date of Documented Sepsis:                                          - If Yes, Date of last change in antibiotics:                                           []  Surgery - No screening for 24 hours after surgery   - If Yes, Date of Surgery:                                           []  Arctic Sun hypothermia protocol- Resume screening when arctic sun complete   []  Comfort Care Orders- Do not resume screening    Did you check any of the boxes above?     [x]  No, Continue to section A   []  Yes, Stop Here, Patient Excluded from Sepsis Screening. If screening should resume in the future, place "sticky note to treatment team" with date/time of when screening should resume. Communicate patient excluded from screening due to Comfort Care Orders using "sticky note to treatment team."    A. Infection:      Does your patient have ONE or more of the following infection criteria?     []  Documented Infection - Does the patient have positive culture results (from blood, sputum, urine, etc)?   []  Anti-Infective Therapy - Is the patient receiving antibiotic, antifungal, or other anti-infective therapy?   []  Pneumonia - Is there documentation of pneumonia (X Ray, etc)?   []  WBC's - Have WBC's been found in normally sterile fluid (urine, CSF, etc.)?   []  Perforated Viscus - Does the patient have a perforated hollow organ (bowel)?    A.  Did you check any of the boxes above?     [x]  No, Stop Here and Sepsis Screen Negative   []  Yes, continue to section B    B. SIRS:      Does your patient have TWO or more of the following SIRS criteria?     []  Temperature - Is the patient's temperature: Temp: 96.4 F (35.8 C) (12/18/13  2309)   - Greater than or equal to 38.3 degrees C (greater than 100.9 degrees F)?   - Less than or equal to 36 degrees C (less than or equal to 96.8 degrees F)?     []  Heart Rate: Heart Rate: 62 (12/18/13 2309)   - Is the patient's heart rate greater than or equal to 90 bpm?     []  Respiratory: Resp Rate: 18 (12/18/13 2309)   - Is the patient's respiratory rate greater than 20?     []  WBC Count - Is the patient's WBC count:   Recent Labs  Lab 12/18/13  1140   WBC 6.10       -  Greater than or equal to 12,000/mm3 OR   - Less than or equal to 4,000/mm3 OR    - Are there greater than 10% immature neutrophils (bands)?     []  Glucose >140 without diabetes or on steroids?   Recent Labs  Lab 12/18/13  1140   GLUCOSE 112*         []  Significant edema is present?    B.  Did you check two or more of the boxes above?     []  No, Stop Here and Sepsis Screen Negative   []  Yes, contact Charge Nurse, continue to section C    C. ACUTE Organ Dysfunction:      Does your patient have ONE or more of the following organ dysfunction? (May need to wait for lab results for assessment - see below) Organ dysfunction must be a result of the sepsis NOT CHRONIC conditions.     []  Cardiovascular - Does the patient have a: BP: 153/81 mmHg (12/18/13 2309)   - Systolic Blood Pressure less than or equal to 90 mmHg OR   - Systolic Blood Pressure has dropped 40 mmHg or more from baseline OR   - Mean Arterial Pressure less than or equal to 70 mmHg (for at least one hour despite fluid resuscitation OR   - require vasopressor support?     []  Respiratory - Does the patient have new hypoxia defined by any of the following?   - A sustained increase in oxygen requirements by at least 2L/min on NC or 28% FiO2 within the last 24 hrs OR   - A persistent decrease in oxygen saturation of greater than or equal to 5% lasting at least four or more hours and occurring within the last 24 hours     []  Renal - Does the patient have:   - low urine output (e.g.  Less than 0.5 mL/kg/HR for one hour despite adequate fluid resuscitation OR   - Increased creatinine (greater than 50% increase from baseline) OR   - require acute dialysis?     []  Hematologic - Does the patient have:   - Low platelet count (less than 100,000 mm3)   Recent Labs  Lab 12/18/13  1140   PLATELETS 252    OR   - INR/aPTT greater than upper limit of normal?   Recent Labs  Lab 12/18/13  1140   PT INR 1.0    OR No results for input(s): APTT in the last 168 hours.     []  Metabolic - Does the patient have a high lactate (plasma lactate greater than or equal to 2.4 mMol/L? No results for input(s): LACTATE in the last 168 hours.     []  Hepatic - Are the patient's liver enzymes elevated (ALT greater than 72 IU/L or Total Bilirubin greater than 2 MG/dL)?   Recent Labs  Lab 12/18/13  1140   BILIRUBIN, TOTAL 0.7   ALT 22         []  CNS - Does the patient have altered consciousness or reduced Glasgow Coma Scale?     Other Active Diagnoses that may be contributing to signs of end organ dysfunction (Ex. Chronic kidney disease, cirrhosis):   - ________________________________________________________________      C.  Did you check any of the boxes above?     []  No, Sepsis Screen Negative   []  YES:  A) Infection + B) SIRS + C) Acute Organ Dysfunction = Positive Screen for Severe Sepsis. Notify attending (house officer during off-hours).  Notify Attending/House Garment/textile technologist and document in Complex Assessment under provider notification   - Name of physician notified:                                           - Date/Time Notifiied:                                             Document actions:   _0  Lactate drawn   _1  Blood Cultures obtained   _2  Antibiotics initiated or modified   _3   IV Fluid administered 0.9% NS __________ mLs given   Nursing Comments/Narrative:    - ______________________________________________________________     The Surviving Sepsis Guidelines recommend the following interventions to be  completed within one hour of a positive sepsis screen.   Obtain new blood cultures prior to antibiotic administration if not done within the last 24 hours.   Obtain lactate level, if initial lactate > 47mol, repeat lactate in 2 hours for goal decrease 10-20%; If there is not a decrease call [facility], hospital  If SBP < 90 or MAP < 65 or lactate greater than 4 mmol/dl; Start 0.9% NS IV Fluid Bolus of 30 mL/Kg (minimum) to maintain MAP > 65    Initiate vasopressors for hypotension not responding to fluid resuscitation (1st line Norepinephrine 1 - 300 mcg/min IV) - Patient must be in CCU if requires vasopressor   Initiate or escalate antibiotic therapy   Goal urine output greater than/equal to 0.5 mL/kg/hr

## 2013-12-19 NOTE — Plan of Care (Signed)
Problem: Safety  Goal: Patient will be free from injury during hospitalization  Outcome: Progressing  Bed locked and in lowest position, pt able to make needs known, call bell within reach.    Problem: Pain  Goal: Patient's pain/discomfort is manageable  Outcome: Progressing  C/o 8/10 chest pain at the beginning of shift, dilaudid 0.5 mg given.    Problem: Chest Pain  Goal: Vital signs and cardiac rhythm stable  Outcome: Progressing  VSS, SR on the monitor    Comments:   Pt NPO after midnight for stress test this AM

## 2013-12-19 NOTE — Plan of Care (Signed)
Problem: Health Promotion  Goal: Knowledge - disease process  Extent of understanding conveyed about a specific disease process.   Outcome: Progressing  Goal: Knowledge - health resources  Extent of understanding and conveyed about healthcare resources.   Outcome: Progressing    Problem: Safety  Goal: Patient will be free from injury during hospitalization  Outcome: Progressing    Problem: Pain  Goal: Patient's pain/discomfort is manageable  Outcome: Progressing    Problem: Psychosocial and Spiritual Needs  Goal: Demonstrates ability to cope with hospitalization/illness  Outcome: Progressing    Problem: Moderate/High Fall Risk Score >/=15  Goal: Patient will remain free of falls  Outcome: Progressing    Problem: Chest Pain  Goal: Vital signs and cardiac rhythm stable  Outcome: Progressing  Goal: Pain at/below Pt Goal: Patient comfortable  Outcome: Progressing  Goal: Anxiety managed/Pt demonstrates effective coping  Outcome: Progressing  Goal: Patient/Patient Care Companion demonstrates understanding of disease process, treatment plan, medications, and discharge plan  Outcome: Progressing

## 2013-12-19 NOTE — Plan of Care (Signed)
Pt has been alert and oriented x 4.  Complained of pain in chest and head.  Relieved with relaxation and medication.    Pt went for CT of head. See results.  Pt went for Stress Test.  See results.  Pt scheduled to has Echocardiogram today.    Presently pt is resting comfortably in bed with call bell within reach.  Bed in lowest position.    Pt and family updated on plan of care.    Pt will continue to be monitored throughout the day.  All changes will be noted.

## 2013-12-20 ENCOUNTER — Observation Stay: Payer: BLUE CROSS/BLUE SHIELD

## 2013-12-20 DIAGNOSIS — I10 Essential (primary) hypertension: Secondary | ICD-10-CM

## 2013-12-20 DIAGNOSIS — I160 Hypertensive urgency: Secondary | ICD-10-CM | POA: Insufficient documentation

## 2013-12-20 LAB — GFR: EGFR: 55.6

## 2013-12-20 LAB — BASIC METABOLIC PANEL
BUN: 15 mg/dL (ref 7–19)
CO2: 23 mEq/L (ref 22–29)
Calcium: 8.6 mg/dL (ref 8.5–10.5)
Chloride: 107 mEq/L (ref 100–111)
Creatinine: 1.2 mg/dL — ABNORMAL HIGH (ref 0.6–1.0)
Glucose: 102 mg/dL — ABNORMAL HIGH (ref 70–100)
Potassium: 4.5 mEq/L (ref 3.5–5.1)
Sodium: 140 mEq/L (ref 136–145)

## 2013-12-20 LAB — CBC
Hematocrit: 38 % (ref 37.0–47.0)
Hgb: 11.6 g/dL — ABNORMAL LOW (ref 12.0–16.0)
MCH: 23.3 pg — ABNORMAL LOW (ref 28.0–32.0)
MCHC: 30.5 g/dL — ABNORMAL LOW (ref 32.0–36.0)
MCV: 76.3 fL — ABNORMAL LOW (ref 80.0–100.0)
MPV: 11.2 fL (ref 9.4–12.3)
Nucleated RBC: 0 /100 WBC (ref 0–1)
Platelets: 227 10*3/uL (ref 140–400)
RBC: 4.98 10*6/uL (ref 4.20–5.40)
RDW: 17 % — ABNORMAL HIGH (ref 12–15)
WBC: 7.5 10*3/uL (ref 3.50–10.80)

## 2013-12-20 MED ORDER — LISINOPRIL 20 MG PO TABS
20.0000 mg | ORAL_TABLET | Freq: Every day | ORAL | Status: DC
Start: 2013-12-21 — End: 2013-12-20

## 2013-12-20 MED ORDER — ROSUVASTATIN CALCIUM 10 MG PO TABS
10.0000 mg | ORAL_TABLET | Freq: Every evening | ORAL | Status: AC
Start: 2013-12-20 — End: ?

## 2013-12-20 MED ORDER — ASPIRIN 81 MG PO CHEW
81.0000 mg | CHEWABLE_TABLET | Freq: Every day | ORAL | Status: AC
Start: 2013-12-20 — End: ?

## 2013-12-20 MED ORDER — NIFEDIPINE ER 30 MG PO TB24
30.0000 mg | ORAL_TABLET | Freq: Every day | ORAL | Status: AC
Start: 2013-12-20 — End: ?

## 2013-12-20 MED ORDER — TECHNETIUM TC 99M TETROFOSMIN INJECTION
1.0000 | Freq: Once | Status: AC | PRN
Start: 2013-12-20 — End: 2013-12-20
  Administered 2013-12-20: 1 via INTRAVENOUS

## 2013-12-20 MED ORDER — REGADENOSON 0.4 MG/5ML IV SOLN
INTRAVENOUS | Status: DC
Start: 2013-12-20 — End: 2013-12-20
  Filled 2013-12-20: qty 5

## 2013-12-20 MED ORDER — LISINOPRIL 20 MG PO TABS
20.0000 mg | ORAL_TABLET | Freq: Every day | ORAL | Status: DC
Start: 2013-12-20 — End: 2013-12-20
  Administered 2013-12-20: 20 mg via ORAL
  Filled 2013-12-20: qty 1

## 2013-12-20 MED ORDER — LISINOPRIL 20 MG PO TABS
20.0000 mg | ORAL_TABLET | Freq: Every day | ORAL | Status: AC
Start: 2013-12-21 — End: ?

## 2013-12-20 MED ORDER — THALLOUS CHLORIDE TL 201 1 MCI/ML IV SOLN
3.0000 | Freq: Once | INTRAVENOUS | Status: AC | PRN
Start: 2013-12-20 — End: 2013-12-20
  Administered 2013-12-20: 3 via INTRAVENOUS

## 2013-12-20 NOTE — Discharge Summary (Signed)
Discharge Summary    Date:12/20/2013   Patient Name: Desiree Manning  Attending Physician: Theresia Lo, MD    Date of Admission:   12/18/2013    Date of Discharge:   12/20/2013     Admitting Diagnosis:   Acute chest pain   HTN malignancy   Headache   Acute kidney injury   Obesity     Discharge Dx:     Principal Diagnosis (Diagnosis after study, that is chiefly responsible for admission to inpatient status): *  Active Hospital Problems    Diagnosis POA   . Microcytic anemia Yes      Resolved Hospital Problems    Diagnosis POA   . Chest pain Yes   . HTN (hypertension), malignant Yes   . AKI (acute kidney injury) Yes       Treatment Team:   Treatment Team:   Attending Provider: Theresia Lo, MD     Procedures performed:   Radiology: all results from this admission  Ct Head Wo Contrast    12/19/2013    1. No CT evidence of an acute intracranial abnormality.  2. Chronic small vessel ischemic changes.  Fonnie Mu, MD  12/19/2013 11:19 AM     Nm Myocardial Perfusion Spect (at Rest)    12/20/2013    1. There is no evidence for ischemia. 2. Normal ejection fraction calculated at 79%.  Lorenda Peck, MD  12/20/2013 12:10 PM     Xr Chest  Ap Portable    12/18/2013    Question atelectasis or infiltrate at the right lung base  Kinnie Feil, MD  12/18/2013 12:03 PM     Nm Exercise Stress Test    12/19/2013     1.  Submaximal  Exercise Treadmill Test and non diagnostic 2. Poor exercise tolerance 3.  No arrhythmias with stress 4.  Would recommend a pharmacological stress test   I was present through the entire test and personally supervised it.  Bari Edward, MD  12/19/2013 12:09 PM     Cv Cardiac Stress Test Tracing Only    12/20/2013     1. Normal Exercise Treadmill Test 2. Nuclear images to be interpreted separately.   I was present through the entire test and personally supervised it.  Bari Edward, MD  12/20/2013 10:43 AM         Reason for Admission:   58 years old female with past medical history of hypertension, diabetes  mellitus presented to the emergency room secondary to Chest pain     Hospital Course:     1- CHEST PAIN likely secondary to hypertension, malignancy  Patient was admitted secondary to acute onset chest pain at rest which was nonradiating.  It her initial EKG and cardiac enzymes was negative.  She underwent exercise stress test, but unfortunately due to the poor exercise alert and tolerance.  She was not be able to finish the test. echocardiogram showed normal LV size and function.  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  Her blood sugar range was 100-112     4-ACUTE KIDNEY INJURY admission was most probably secondary to  hypertension, malignancy.and ace inh   Her kidney function improved next day    5- OBESITY SP GASTRIC BYPASS SURGERY   She has some mild anemia.  She is up-to-date for her endoscopy and colonoscopy    6- HYPERLIPIDEMIA : LDL 173 AND TG 147 CHOL 235   He was a started on a small dose of simvastatin and she needs to repeat  liver function test and lipid panel in 3 months     Condition at Discharge:   Stable , chest pain  Today:     BP 114/62 mmHg  Pulse 78  Temp(Src) 98.1 F (36.7 C) (Oral)  Resp 20  Ht 1.6 m (5\' 3" )  Wt 107.865 kg (237 lb 12.8 oz)  BMI 42.13 kg/m2  SpO2 98%  Ranges for the last 24 hours:  Temp:  [97.3 F (36.3 C)-98.1 F (36.7 C)] 98.1 F  (36.7 C)  Heart Rate:  [71-79] 78  Resp Rate:  [16-20] 20  BP: (109-134)/(56-78) 114/62 mmHg    Last set of labs     Recent Labs  Lab 12/20/13  0513   WBC 7.50   HEMOGLOBIN 11.6*   HEMATOCRIT 38.0   PLATELETS 227       Recent Labs  Lab 12/20/13  0513   SODIUM 140   POTASSIUM 4.5   CHLORIDE 107   CO2 23   BUN 15   CREATININE 1.2*   EGFR 55.6   GLUCOSE 102*   CALCIUM 8.6       Recent Labs  Lab 12/18/13  1140   BILIRUBIN, TOTAL 0.7   PROTEIN, TOTAL 7.4   ALBUMIN 3.6   ALT 22   AST (SGOT) 19       Recent Labs  Lab 12/19/13  0515   CHOLESTEROL 235*   TRIGLYCERIDES 147   HDL 33*   CALCULATED LDL 173*       Recent Labs  Lab 12/19/13  0515 12/18/13  2323 12/18/13  1702   TROPONIN I <0.01 <0.01 <0.01       Micro / Labs / Path pending:     Unresulted Labs    Procedure . . . Date/Time    Echocardiogram Adult Complete W Color Doppler Waveform [[id number]] Resulted:  12/20/13 1046     Updated:  12/20/13 1110            Discharge Instructions:     Follow-up Information    Follow up with Pcp, Unabletoobtain, MD .          Discharge Diet: Cardiac Diet        Extended Emergency Contact Information  Primary Emergency Contact: No,Nontact   [place]  Home Phone: [phone]  Relation: None  Disposition:        Discharge Medication List      Taking         aspirin 81 MG chewable tablet   Dose:  81 mg   Chew 1 tablet (81 mg total) by mouth daily.       lisinopril 20 MG tablet   Dose:  20 mg   What changed:    -  medication strength  - how much to take   Commonly known as:  PRINIVIL,ZESTRIL   Start taking on:  12/21/2013   Take 1 tablet (20 mg total) by mouth daily.       metFORMIN 1000 MG (MOD) 24 hr tablet   Dose:  1000 mg   Commonly known as:  GLUMETZA   Take 1,000 mg by mouth every morning with breakfast.       NIFEdipine ER 30 MG 24 hr tablet   Dose:  30 mg   Commonly known as:  ADALAT CC   Take 1 tablet (30 mg total) by mouth daily.       rosuvastatin 10 MG tablet   Dose:  10 mg   Commonly known as:  CRESTOR   Take 1  tablet (10 mg total) by mouth nightly.         STOP taking these medications         AMLODIPINE BESYLATE PO           Minutes spent coordinating discharge and reviewing discharge plan: 35 minutes      Signed by: Theresia Lo, MD   C.C : PCP Dr Rayford Halsted ,MD   Phone [phone]

## 2013-12-20 NOTE — Progress Notes (Signed)
DISCHARGE-Pt A&Ox4, orders written for D/C.  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  Pt stated understanding of all questions and has no further questions at this time.  Pt's IV and tele were removed.  Pt will be transported off unit by floor tech.

## 2013-12-20 NOTE — Plan of Care (Signed)
Problem: Safety  Goal: Patient will be free from injury during hospitalization  Outcome: Progressing  Bed locked and in lowest position, pt able to make needs known, call bell within reach    Problem: Pain  Goal: Patient's pain/discomfort is manageable  Outcome: Progressing  Denies pain, no acute distress noted.    Problem: Chest Pain  Goal: Vital signs and cardiac rhythm stable  Outcome: Progressing  VSS, Sinus rhythm on the monitor    Comments:   Plan of care reviewed with pt:   Monitor   Safety   Pt shceduled to have a stress test this AM.   NPO for the stress test

## 2013-12-20 NOTE — Discharge Instructions (Signed)
Chest Pain, Noncardiac    Based on your visit today, the exact cause of your chest pain is not certain. Your condition does not seem serious and your pain does not appear to be coming from your heart. However, sometimes the signs of a serious problem take more time to appear. Therefore, please watch for the warning signs listed below.  Home Care:   Rest today and avoid strenuous activity.   Take any prescribed medicine as directed.  Follow Up  with your doctor or this facility as instructed or if you do not start to feel better within 24 hours.  Get Prompt Medical Attention  if any of the following occur:   A change in the type of pain: if it feels different, becomes more severe, lasts longer, or begins to spread into your shoulder, arm, neck, jaw or back   Shortness of breath or increased pain with breathing   Cough with dark colored sputum (phlegm) or blood   Weakness, dizziness, or fainting   Fever of 100.47F (38C) or higher, or as directed by your healthcare provider   Swelling, pain or redness in one leg   2000-2014 [employer] [address], [place], [place] [address]. All rights reserved. This information is not intended as a substitute for professional medical care. Always follow your healthcare professional's instructions.        Lisinopril Oral tablet  What is this medicine?  LISINOPRIL (lyse [place] pril) is an ACE inhibitor. This medicine is used to treat high blood pressure and heart failure. It is also used to protect the heart immediately after a heart attack.  This medicine may be used for other purposes; ask your health care provider or pharmacist if you have questions.  What should I tell my health care provider before I take this medicine?  They need to know if you have any of these conditions:   diabetes   heart or blood vessel disease   immune system disease like lupus or scleroderma   kidney disease   low blood pressure   previous swelling of the tongue, face, or  lips with difficulty breathing, difficulty swallowing, hoarseness, or tightening of the throat   an unusual or allergic reaction to lisinopril, other ACE inhibitors, insect venom, foods, dyes, or preservatives   pregnant or trying to get pregnant   breast-feeding  How should I use this medicine?  Take this medicine by mouth with a glass of water. Follow the directions on your prescription label. You may take this medicine with or without food. Take your medicine at regular intervals. Do not stop taking this medicine except on the advice of your doctor or health care professional.  Talk to your pediatrician regarding the use of this medicine in children. Special care may be needed. While this drug may be prescribed for children as young as 44 years of age for selected conditions, precautions do apply.  Overdosage: If you think you have taken too much of this medicine contact a poison control center or emergency room at once.  NOTE: This medicine is only for you. Do not share this medicine with others.  What if I miss a dose?  If you miss a dose, take it as soon as you can. If it is almost time for your next dose, take only that dose. Do not take double or extra doses.  What may interact with this medicine?   diuretics   lithium   NSAIDs, medicines for pain and  inflammation, like ibuprofen or naproxen   over-the-counter herbal supplements like hawthorn   potassium salts or potassium supplements   salt substitutes  This list may not describe all possible interactions. Give your health care provider a list of all the medicines, herbs, non-prescription drugs, or dietary supplements you use. Also tell them if you smoke, drink alcohol, or use illegal drugs. Some items may interact with your medicine.  What should I watch for while using this medicine?  Visit your doctor or health care professional for regular check ups. Check your blood pressure as directed. Ask your doctor what your blood pressure should be, and  when you should contact him or her. Call your doctor or health care professional if you notice an irregular or fast heart beat.  Women should inform their doctor if they wish to become pregnant or think they might be pregnant. There is a potential for serious side effects to an unborn child. Talk to your health care professional or pharmacist for more information.  Check with your doctor or health care professional if you get an attack of severe diarrhea, nausea and vomiting, or if you sweat a lot. The loss of too much body fluid can make it dangerous for you to take this medicine.  You may get drowsy or dizzy. Do not drive, use machinery, or do anything that needs mental alertness until you know how this drug affects you. Do not stand or sit up quickly, especially if you are an older patient. This reduces the risk of dizzy or fainting spells. Alcohol can make you more drowsy and dizzy. Avoid alcoholic drinks.  Avoid salt substitutes unless you are told otherwise by your doctor or health care professional.  Do not treat yourself for coughs, colds, or pain while you are taking this medicine without asking your doctor or health care professional for advice. Some ingredients may increase your blood pressure.  What side effects may I notice from receiving this medicine?  Side effects that you should report to your doctor or health care professional as soon as possible:   abdominal pain with or without nausea or vomiting   allergic reactions like skin rash or hives, swelling of the hands, feet, face, lips, throat, or tongue   dark urine   difficulty breathing   dizzy, lightheaded or fainting spell   fever or sore throat   irregular heart beat, chest pain   pain or difficulty passing urine   redness, blistering, peeling or loosening of the skin, including inside the mouth   unusually weak   yellowing of the eyes or skin  Side effects that usually do not require medical attention (report to your doctor or health  care professional if they continue or are bothersome):   change in taste   cough   decreased sexual function or desire   headache   sun sensitivity   tiredness  This list may not describe all possible side effects. Call your doctor for medical advice about side effects. You may report side effects to FDA at 1-800-FDA-1088.  Where should I keep my medicine?  Keep out of the reach of children.  Store at room temperature between 15 and 30 degrees C (59 and 86 degrees F). Protect from moisture. Keep container tightly closed. Throw away any unused medicine after the expiration date.  NOTE:This sheet is a summary. It may not cover all possible information. If you have questions about this medicine, talk to your doctor, pharmacist, or health care provider. Copyright  2014 Gold Standard      Patient Education   Nifedipine Oral capsule   Nifedipine Oral tablet, extended-release  Nifedipine Oral capsule  What is this medicine?  NIFEDIPINE (nye FED i peen) is a calcium-channel blocker. It affects the amount of calcium found in your heart and muscle cells. This relaxes your blood vessels, which can reduce the amount of work the heart has to do. This medicine is used to treat chest pain caused by angina.  This medicine may be used for other purposes; ask your health care provider or pharmacist if you have questions.  What should I tell my health care provider before I take this medicine?  They need to know if you have any of these conditions:   heart problems, low blood pressure, slow or irregular heartbeat   kidney disease   liver disease   previous heart attack   an unusual or allergic reaction to nifedipine, other medicines, foods, dyes, or preservatives   pregnant or trying to get pregnant   breast-feeding  How should I use this medicine?  Take this medicine by mouth with a glass of water. Follow the directions on the prescription label. Swallow whole. Take your doses at regular intervals. Do not take your  medicine more often then directed. Do not suddenly stop taking this medicine. Your doctor will tell you how much medicine to take. If your doctor wants you to stop the medicine, the dose will be slowly lowered over time to avoid any side effects.  Talk to your pediatrician regarding the use of this medicine in children. Special care may be needed.  Overdosage: If you think you have taken too much of this medicine contact a poison control center or emergency room at once.  NOTE: This medicine is only for you. Do not share this medicine with others.  What if I miss a dose?  If you miss a dose, take it as soon as you can. If it is almost time for your next dose, take only that dose. Do not take double or extra doses.  What may interact with this medicine?  Do not take this medicine with any of the following medications:   certain medicines for seizures like carbamazepine, phenobarbital, phenytoin   rifabutin   rifampin   rifapentine   St. John's Wort  This medicine may also interact with the following medications:   antiviral medicines for HIV or AIDS   certain medicines for blood pressure   certain medicines for diabetes   certain medicines for erectile dysfunction   certain medicines for fungal infections like ketoconazole, fluconazole, and itraconazole   certain medicines for irregular heart beat like flecainide and quinidine   certain medicines that treat or prevent blood clots like warfarin   clarithromycin   digoxin   dolasetron   erythromycin   fluoxetine   grapefruit juice   local or general anesthetics   nefazodone   orlistat   quinupristin; dalfopristin   sirolimus   stomach acid blockers like cimetidine, ranitidine, omeprazole, or pantoprazole   tacrolimus   valproic acid  This list may not describe all possible interactions. Give your health care provider a list of all the medicines, herbs, non-prescription drugs, or dietary supplements you use. Also tell them if you smoke, drink  alcohol, or use illegal drugs. Some items may interact with your medicine.  What should I watch for while using this medicine?  Visit your doctor or health care professional for regular check ups. Check your  blood pressure and pulse rate regularly. Ask your doctor or health care professional what your blood pressure and pulse rate should be and when you should contact him or her.  You may get drowsy or dizzy. Do not drive, use machinery, or do anything that needs mental alertness until you know how this medicine affects you. Do not stand or sit up quickly, especially if you are an older patient. This reduces the risk of dizzy or fainting spells. Alcohol may interfere with the effect of this medicine. Avoid alcoholic drinks.  What side effects may I notice from receiving this medicine?  Side effects that you should report to your doctor or health care professional as soon as possible:   blood in the urine   difficulty breathing   fast heartbeat, palpitations, irregular heartbeat, chest pain   redness, blistering, peeling or loosening of the skin, including inside the mouth   reduced amount of urine passed   skin rash   swelling of the legs and ankles  Side effects that usually do not require medical attention (report to your doctor or health care professional if they continue or are bothersome):   constipation   facial flushing   headache   weakness or tiredness  This list may not describe all possible side effects. Call your doctor for medical advice about side effects. You may report side effects to FDA at 1-800-FDA-1088.  Where should I keep my medicine?  Keep out of the reach of children.  Store at room temperature between 15 and 25 degrees C (59 and 77 degrees F). Protect from light and moisture. Keep container tightly closed. Throw away any unused medicine after the expiration date.  NOTE:This sheet is a summary. It may not cover all possible information. If you have questions about this medicine,  talk to your doctor, pharmacist, or health care provider. Copyright 2014 Gold Standard        Rosuvastatin Calcium Oral tablet  What is this medicine?  ROSUVASTATIN ([place] sta tin) is known as a HMG-CoA reductase inhibitor or 'statin'. It lowers cholesterol and triglycerides in the blood. This drug may also reduce the risk of heart attack, stroke, or other health problems in patients with risk factors for heart disease. Diet and lifestyle changes are often used with this drug.  This medicine may be used for other purposes; ask your health care provider or pharmacist if you have questions.  What should I tell my health care provider before I take this medicine?  They need to know if you have any of these conditions:   frequently drink alcoholic beverages   kidney disease   liver disease   muscle aches or weakness   other medical condition   an unusual or allergic reaction to rosuvastatin, other medicines, foods, dyes, or preservatives   pregnant or trying to get pregnant   breast-feeding  How should I use this medicine?  Take this medicine by mouth with a glass of water. Follow the directions on the prescription label. Do not cut, crush or chew this medicine. You can take this medicine with or without food. Take your doses at regular intervals. Do not take your medicine more often than directed.  Talk to your pediatrician regarding the use of this medicine in children. While this drug may be prescribed for children as young as 29 years old for selected conditions, precautions do apply.  Overdosage: If you think you have taken too much of this medicine contact a poison control  center or emergency room at once.  NOTE: This medicine is only for you. Do not share this medicine with others.  What if I miss a dose?  If you miss a dose, take it as soon as you can. Do not take 2 doses within 12 hours of each other. If there are less than 12 hours until your next dose, take only that dose. Do not take double or  extra doses.  What may interact with this medicine?  Do not take this medicine with any of the following medications:   herbal medicines like red yeast rice  This medicine may also interact with the following medications:   alcohol   antacids containing aluminum hydroxide or magnesium hydroxide   cyclosporine   other medicines for high cholesterol   some medicines for HIV infection   warfarin  This list may not describe all possible interactions. Give your health care provider a list of all the medicines, herbs, non-prescription drugs, or dietary supplements you use. Also tell them if you smoke, drink alcohol, or use illegal drugs. Some items may interact with your medicine.  What should I watch for while using this medicine?  Visit your doctor or health care professional for regular check-ups. You may need regular tests to make sure your liver is working properly.  Tell your doctor or health care professional right away if you get any unexplained muscle pain, tenderness, or weakness, especially if you also have a fever and tiredness. Your doctor or health care professional may tell you to stop taking this medicine if you develop muscle problems. If your muscle problems do not go away after stopping this medicine, contact your health care professional.  This medicine may affect blood sugar levels. If you have diabetes, check with your doctor or health care professional before you change your diet or the dose of your diabetic medicine.  Avoid taking antacids containing aluminum, calcium or magnesium within 2 hours of taking this medicine.  This drug is only part of a total heart-health program. Your doctor or a dietician can suggest a low-cholesterol and low-fat diet to help. Avoid alcohol and smoking, and keep a proper exercise schedule.  Do not use this drug if you are pregnant or breast-feeding. Serious side effects to an unborn child or to an infant are possible. Talk to your doctor or pharmacist for more  information.  What side effects may I notice from receiving this medicine?  Side effects that you should report to your doctor or health care professional as soon as possible:   allergic reactions like skin rash, itching or hives, swelling of the face, lips, or tongue   dark urine   fever   joint pain   muscle cramps, pain   redness, blistering, peeling or loosening of the skin, including inside the mouth   trouble passing urine or change in the amount of urine   unusually weak or tired   yellowing of the eyes or skin  Side effects that usually do not require medical attention (report to your doctor or health care professional if they continue or are bothersome):   constipation   heartburn   nausea   stomach gas, pain, upset  This list may not describe all possible side effects. Call your doctor for medical advice about side effects. You may report side effects to FDA at 1-800-FDA-1088.  Where should I keep my medicine?  Keep out of the reach of children.  Store at room temperature between 20 and  25 degrees C (68 and 77 degrees F). Keep container tightly closed (protect from moisture). Throw away any unused medicine after the expiration date.  NOTE:This sheet is a summary. It may not cover all possible information. If you have questions about this medicine, talk to your doctor, pharmacist, or health care provider. Copyright 2014 Gold Standard

## 2013-12-20 NOTE — Progress Notes (Signed)
Severe Sepsis Screen    Date: 12/20/2013 Time: 1:49 PM  Nurse Signature: ZOXW Darwyn Ponzo    Exclusions:      Patients meeting the following criteria are excluded from screening:     []  Suspicion or diagnosis of sepsis is documented and until 72 hours after antibiotics started or last regimen change:   - If Yes, Date of Documented Sepsis:                                          - If Yes, Date of last change in antibiotics:                                           []  Surgery - No screening for 24 hours after surgery   - If Yes, Date of Surgery:                                           []  Arctic Sun hypothermia protocol- Resume screening when arctic sun complete   []  Comfort Care Orders- Do not resume screening    Did you check any of the boxes above?     [x]  No, Continue to section A   []  Yes, Stop Here, Patient Excluded from Sepsis Screening. If screening should resume in the future, place "sticky note to treatment team" with date/time of when screening should resume. Communicate patient excluded from screening due to Comfort Care Orders using "sticky note to treatment team."    A. Infection:      Does your patient have ONE or more of the following infection criteria?     []  Documented Infection - Does the patient have positive culture results (from blood, sputum, urine, etc)?   []  Anti-Infective Therapy - Is the patient receiving antibiotic, antifungal, or other anti-infective therapy?   []  Pneumonia - Is there documentation of pneumonia (X Ray, etc)?   []  WBC's - Have WBC's been found in normally sterile fluid (urine, CSF, etc.)?   []  Perforated Viscus - Does the patient have a perforated hollow organ (bowel)?    A.  Did you check any of the boxes above?     [x]  No, Stop Here and Sepsis Screen Negative   []  Yes, continue to section B    B. SIRS:      Does your patient have TWO or more of the following SIRS criteria?     []  Temperature - Is the patient's temperature: Temp: 98.1 F (36.7 C) (12/20/13  0428)   - Greater than or equal to 38.3 degrees C (greater than 100.9 degrees F)?   - Less than or equal to 36 degrees C (less than or equal to 96.8 degrees F)?     []  Heart Rate: Heart Rate: 78 (12/20/13 0428)   - Is the patient's heart rate greater than or equal to 90 bpm?     []  Respiratory: Resp Rate: 20 (12/20/13 0428)   - Is the patient's respiratory rate greater than 20?     []  WBC Count - Is the patient's WBC count:   Recent Labs  Lab 12/20/13  0513   WBC 7.50       -  Greater than or equal to 12,000/mm3 OR   - Less than or equal to 4,000/mm3 OR    - Are there greater than 10% immature neutrophils (bands)?     []  Glucose >140 without diabetes or on steroids?   Recent Labs  Lab 12/20/13  0513   GLUCOSE 102*         []  Significant edema is present?    B.  Did you check two or more of the boxes above?     []  No, Stop Here and Sepsis Screen Negative   []  Yes, contact Charge Nurse, continue to section C    C. ACUTE Organ Dysfunction:      Does your patient have ONE or more of the following organ dysfunction? (May need to wait for lab results for assessment - see below) Organ dysfunction must be a result of the sepsis NOT CHRONIC conditions.     []  Cardiovascular - Does the patient have a: BP: 114/62 mmHg (12/20/13 0428)   - Systolic Blood Pressure less than or equal to 90 mmHg OR   - Systolic Blood Pressure has dropped 40 mmHg or more from baseline OR   - Mean Arterial Pressure less than or equal to 70 mmHg (for at least one hour despite fluid resuscitation OR   - require vasopressor support?     []  Respiratory - Does the patient have new hypoxia defined by any of the following?   - A sustained increase in oxygen requirements by at least 2L/min on NC or 28% FiO2 within the last 24 hrs OR   - A persistent decrease in oxygen saturation of greater than or equal to 5% lasting at least four or more hours and occurring within the last 24 hours     []  Renal - Does the patient have:   - low urine output (e.g.  Less than 0.5 mL/kg/HR for one hour despite adequate fluid resuscitation OR   - Increased creatinine (greater than 50% increase from baseline) OR   - require acute dialysis?     []  Hematologic - Does the patient have:   - Low platelet count (less than 100,000 mm3)   Recent Labs  Lab 12/20/13  0513   PLATELETS 227    OR   - INR/aPTT greater than upper limit of normal?   Recent Labs  Lab 12/18/13  1140   PT INR 1.0    OR No results for input(s): APTT in the last 168 hours.     []  Metabolic - Does the patient have a high lactate (plasma lactate greater than or equal to 2.4 mMol/L? No results for input(s): LACTATE in the last 168 hours.     []  Hepatic - Are the patient's liver enzymes elevated (ALT greater than 72 IU/L or Total Bilirubin greater than 2 MG/dL)?   Recent Labs  Lab 12/18/13  1140   BILIRUBIN, TOTAL 0.7   ALT 22         []  CNS - Does the patient have altered consciousness or reduced Glasgow Coma Scale?     Other Active Diagnoses that may be contributing to signs of end organ dysfunction (Ex. Chronic kidney disease, cirrhosis):   - ________________________________________________________________      C.  Did you check any of the boxes above?     []  No, Sepsis Screen Negative   []  YES:  A) Infection + B) SIRS + C) Acute Organ Dysfunction = Positive Screen for Severe Sepsis. Notify attending (house officer during off-hours).  Notify Attending/House Garment/textile technologist and document in Complex Assessment under provider notification   - Name of physician notified:                                           - Date/Time Notifiied:                                             Document actions:   _0  Lactate drawn   _1  Blood Cultures obtained   _2  Antibiotics initiated or modified   _3   IV Fluid administered 0.9% NS __________ mLs given   Nursing Comments/Narrative:    - ______________________________________________________________     The Surviving Sepsis Guidelines recommend the following interventions to be  completed within one hour of a positive sepsis screen.   Obtain new blood cultures prior to antibiotic administration if not done within the last 24 hours.   Obtain lactate level, if initial lactate > 67mol, repeat lactate in 2 hours for goal decrease 10-20%; If there is not a decrease call [facility], hospital  If SBP < 90 or MAP < 65 or lactate greater than 4 mmol/dl; Start 0.9% NS IV Fluid Bolus of 30 mL/Kg (minimum) to maintain MAP > 65    Initiate vasopressors for hypotension not responding to fluid resuscitation (1st line Norepinephrine 1 - 300 mcg/min IV) - Patient must be in CCU if requires vasopressor   Initiate or escalate antibiotic therapy   Goal urine output greater than/equal to 0.5 mL/kg/hr

## 2013-12-20 NOTE — Progress Notes (Signed)
Severe Sepsis Screen    Date: 12/20/2013 Time: 1:28 AM  Nurse Signature: Donneta Romberg    Exclusions:      Patients meeting the following criteria are excluded from screening:     []  Suspicion or diagnosis of sepsis is documented and until 72 hours after antibiotics started or last regimen change:   - If Yes, Date of Documented Sepsis:                                          - If Yes, Date of last change in antibiotics:                                           []  Surgery - No screening for 24 hours after surgery   - If Yes, Date of Surgery:                                           []  Arctic Sun hypothermia protocol- Resume screening when arctic sun complete   []  Comfort Care Orders- Do not resume screening    Did you check any of the boxes above?     [x]  No, Continue to section A   []  Yes, Stop Here, Patient Excluded from Sepsis Screening. If screening should resume in the future, place "sticky note to treatment team" with date/time of when screening should resume. Communicate patient excluded from screening due to Comfort Care Orders using "sticky note to treatment team."    A. Infection:      Does your patient have ONE or more of the following infection criteria?     []  Documented Infection - Does the patient have positive culture results (from blood, sputum, urine, etc)?   []  Anti-Infective Therapy - Is the patient receiving antibiotic, antifungal, or other anti-infective therapy?   []  Pneumonia - Is there documentation of pneumonia (X Ray, etc)?   []  WBC's - Have WBC's been found in normally sterile fluid (urine, CSF, etc.)?   []  Perforated Viscus - Does the patient have a perforated hollow organ (bowel)?    A.  Did you check any of the boxes above?     [x]  No, Stop Here and Sepsis Screen Negative   []  Yes, continue to section B    B. SIRS:      Does your patient have TWO or more of the following SIRS criteria?     []  Temperature - Is the patient's temperature: Temp: 97.3 F (36.3 C) (12/20/13  0022)   - Greater than or equal to 38.3 degrees C (greater than 100.9 degrees F)?   - Less than or equal to 36 degrees C (less than or equal to 96.8 degrees F)?     []  Heart Rate: Heart Rate: 79 (12/20/13 0022)   - Is the patient's heart rate greater than or equal to 90 bpm?     []  Respiratory: Resp Rate: 20 (12/20/13 0022)   - Is the patient's respiratory rate greater than 20?     []  WBC Count - Is the patient's WBC count:   Recent Labs  Lab 12/19/13  0515   WBC 5.98       -  Greater than or equal to 12,000/mm3 OR   - Less than or equal to 4,000/mm3 OR    - Are there greater than 10% immature neutrophils (bands)?     []  Glucose >140 without diabetes or on steroids?   Recent Labs  Lab 12/19/13  0515   GLUCOSE 100         []  Significant edema is present?    B.  Did you check two or more of the boxes above?     []  No, Stop Here and Sepsis Screen Negative   []  Yes, contact Charge Nurse, continue to section C    C. ACUTE Organ Dysfunction:      Does your patient have ONE or more of the following organ dysfunction? (May need to wait for lab results for assessment - see below) Organ dysfunction must be a result of the sepsis NOT CHRONIC conditions.     []  Cardiovascular - Does the patient have a: BP: 120/62 mmHg (12/20/13 0022)   - Systolic Blood Pressure less than or equal to 90 mmHg OR   - Systolic Blood Pressure has dropped 40 mmHg or more from baseline OR   - Mean Arterial Pressure less than or equal to 70 mmHg (for at least one hour despite fluid resuscitation OR   - require vasopressor support?     []  Respiratory - Does the patient have new hypoxia defined by any of the following?   - A sustained increase in oxygen requirements by at least 2L/min on NC or 28% FiO2 within the last 24 hrs OR   - A persistent decrease in oxygen saturation of greater than or equal to 5% lasting at least four or more hours and occurring within the last 24 hours     []  Renal - Does the patient have:   - low urine output (e.g.  Less than 0.5 mL/kg/HR for one hour despite adequate fluid resuscitation OR   - Increased creatinine (greater than 50% increase from baseline) OR   - require acute dialysis?     []  Hematologic - Does the patient have:   - Low platelet count (less than 100,000 mm3)   Recent Labs  Lab 12/19/13  0515   PLATELETS 235    OR   - INR/aPTT greater than upper limit of normal?   Recent Labs  Lab 12/18/13  1140   PT INR 1.0    OR No results for input(s): APTT in the last 168 hours.     []  Metabolic - Does the patient have a high lactate (plasma lactate greater than or equal to 2.4 mMol/L? No results for input(s): LACTATE in the last 168 hours.     []  Hepatic - Are the patient's liver enzymes elevated (ALT greater than 72 IU/L or Total Bilirubin greater than 2 MG/dL)?   Recent Labs  Lab 12/18/13  1140   BILIRUBIN, TOTAL 0.7   ALT 22         []  CNS - Does the patient have altered consciousness or reduced Glasgow Coma Scale?     Other Active Diagnoses that may be contributing to signs of end organ dysfunction (Ex. Chronic kidney disease, cirrhosis):   - ________________________________________________________________      C.  Did you check any of the boxes above?     []  No, Sepsis Screen Negative   []  YES:  A) Infection + B) SIRS + C) Acute Organ Dysfunction = Positive Screen for Severe Sepsis. Notify attending (house officer during off-hours).  Notify Attending/House Garment/textile technologist and document in Complex Assessment under provider notification   - Name of physician notified:                                           - Date/Time Notifiied:                                             Document actions:   _0  Lactate drawn   _1  Blood Cultures obtained   _2  Antibiotics initiated or modified   _3   IV Fluid administered 0.9% NS __________ mLs given   Nursing Comments/Narrative:    - ______________________________________________________________     The Surviving Sepsis Guidelines recommend the following interventions to be  completed within one hour of a positive sepsis screen.   Obtain new blood cultures prior to antibiotic administration if not done within the last 24 hours.   Obtain lactate level, if initial lactate > 47mol, repeat lactate in 2 hours for goal decrease 10-20%; If there is not a decrease call [facility], hospital  If SBP < 90 or MAP < 65 or lactate greater than 4 mmol/dl; Start 0.9% NS IV Fluid Bolus of 30 mL/Kg (minimum) to maintain MAP > 65    Initiate vasopressors for hypotension not responding to fluid resuscitation (1st line Norepinephrine 1 - 300 mcg/min IV) - Patient must be in CCU if requires vasopressor   Initiate or escalate antibiotic therapy   Goal urine output greater than/equal to 0.5 mL/kg/hr

## 2014-09-05 NOTE — H&P (Signed)
PATIENT NAME:  Monique Graham, RIEMERSMA MR#:  [id number] DATE OF BIRTH:  18-Sep-1955  DATE OF ADMISSION:  10/01/2011  PRIMARY CARE PHYSICIAN:  In [place], [place]  CHIEF COMPLAINT: Left-sided facial arm and leg numbness and tingling.  HISTORY OF PRESENT ILLNESS: 59 year old female with a history of hyperlipidemia, hypertension, diabetes, obesity, and migraines who presents with the above complaint. Over the past two days the patient has had numbness of the legs, arms, and face as well as a headache.  She feels like lightening bolts are hitting her head. Her blood pressure has been variable. She saw her primary care physician last Tuesday and they made some medication changes. She noticed that her tongue feels heavy, some possible slurred speech, and feels kind of off balance.   REVIEW OF SYSTEMS: CONSTITUTIONAL: No fever. Positive fatigue and weakness. EYES: No blurred or double vision or glaucoma. ENT: No ear pain, hearing loss, or seasonal allergies. Positive snoring. RESPIRATORY: No cough, wheezing, hemoptysis, or chronic obstructive pulmonary disease. CARDIOVASCULAR: No orthopnea, edema, dyspnea on exertion, palpitations, or syncope. GI: No nausea, vomiting, diarrhea, abdominal pain, melena, or ulcers. GU: No dysuria or hematuria. ENDOCRINE: No heat or cold intolerance. HEME/LYMPH: No easy bruising. SKIN: No rash or lesions. MUSCULOSKELETAL: No limited activity. No pain in the shoulders or knees. NEURO: No history of cerebrovascular accident, transient ischemic attack, ataxia, or weakness. PSYCH:  No history of anxiety or depression.   PAST MEDICAL HISTORY: 1. Renal insufficiency.  2. Diabetes.  3. Hypertension.  4. Hyperlipidemia.  5. Migraine headaches.  6. Lichen sclerosis.   MEDICATIONS:  1. Aspirin 81 mg daily.  2. Crestor 20 mg daily.  3. Ferrous sulfate 325 mg daily.  4. Fluticasone spray 50 mcg, two nasal sprays daily.  5. HCTZ 25 mg daily.  6. Lisinopril 40 mg daily.  7. Metformin 500  q.12.  8. Multivitamin 1 tablet daily.  9. Nifedipine 30 mg at bedtime.  10. Premarin 0.625 vaginal 2 times a week.  11. Topiramate 15 mg at bedtime.   ALLERGIES: Percocet causes itching and rash.  PAST SURGICAL HISTORY:  Gastric Roux-en-Y in 2005.   FAMILY HISTORY: Positive for breast cancer.    SOCIAL HISTORY: No tobacco, alcohol, or drug use.   PHYSICAL EXAMINATION:  VITAL SIGNS: Temperature 96.7, pulse 80, respirations 18, blood pressure 119/70, 96 percent on room air.   GENERAL: The patient is alert, oriented, not in acute distress.   HEENT: Head is atraumatic. Pupils are round and reactive. Sclerae anicteric. Mucous membranes are moist. Oropharynx is clear.  NECK: Supple without jugular venous distention, carotid bruit, or enlarged thyroid.   CARDIOVASCULAR: Regular rate and rhythm. No murmurs, gallops, or rubs. PMI is not displaced.   LUNGS: Clear to auscultation bilaterally without crackles, rales, rhonchi, or wheezing. Normal percussion.   BACK:  No costovertebral angle tenderness.   ABDOMEN: Bowel sounds positive. Nontender, nondistended. Hard to appreciate organomegaly due to body habitus.   EXTREMITIES: No clubbing, cyanosis, or edema.   NEURO: Cranial nerves II-XII are intact.  There are no focal deficits. Cerebellar exam is completely normal. Heel-to-shin is normal. No ataxia.   SKIN: Without rash or lesions.   LABORATORY, DIAGNOSTIC, AND RADIOLOGICAL DATA: Troponin less than 0.02, CK 108, CPK-MB less than 0.5. White blood cells 10, hemoglobin 12.8, hematocrit 42, platelets are 274, sodium 141, potassium 4, chloride 108, bicarbonate 23, BUN 24, creatinine 1.66. Glucose 113, calcium 8.6, bilirubin 0.4, alkaline phosphatase 112, ALT 22, AST 20, total protein 8.6, albumin 3.6.  Urinalysis shows no leukocyte esterase or nitrites.   CT of the head without contrast showed chronic vessel ischemia. Lower extremity Dopplers showed deep vein thrombosis.   EKG normal  sinus rhythm, possible left atrial enlargement.   ASSESSMENT AND PLAN: This is a 59 year old female who presents with numbness of the left side.  1. Numbness: Rule out cerebrovascular accident versus MS versus atypical migraine. I have placed neurology consult and also ordered MRI. We will increase aspirin from 81 to 325. Continue Crestor. Neuro checks q. 4 hours. Further management after these tests have been completed.  2. Hypertension: We will continue outpatient medications including lisinopril, hydrochlorothiazide, and nifedipine.   3. Hyperlipidemia: Continue Crestor.  4. History of lichen sclerosis: We will hold Premarin for now due to possible cerebrovascular accident.  5. CODE STATUS: The patient is a DO NOT RESUSCITATE status.        TIME SPENT: 45 minutes.    ____________________________ Janyth Contes. Juliene Pina, MD spm:bjt D: 10/01/2011 21:22:36 ET T: 10/02/2011 06:41:24 ET JOB#: [id number]  cc: Cyan Clippinger P. Juliene Pina, MD, <Dictator> Janyth Contes Payslie Mccaig MD ELECTRONICALLY SIGNED 10/02/2011 13:50

## 2014-09-05 NOTE — Discharge Summary (Signed)
PATIENT NAME:  Monique Graham, Monique Graham MR#:  [id number] DATE OF BIRTH:  1956/04/16  DATE OF ADMISSION:  10/01/2011 DATE OF DISCHARGE:  10/03/2011  ADMITTING DIAGNOSIS: Migraine headache versus stroke.   DISCHARGE DIAGNOSES:  1. Left-sided numbness and mild left lower extremity weakness, resolved.  2. Likely complicated migraine headache.  3. Dizziness. 4. Hypotension likely due to daytime medications.  5. Hyperlipidemia, hypertriglyceridemia. 6. History of hypertension, hyperlipidemia, and diabetes mellitus with hemoglobin A1c 6.2.   DISCHARGE CONDITION: Stable.   DISCHARGE MEDICATIONS: The patient is to resume her outpatient medications which are:  1. Topiramate 50 mg p.o. at bedtime. 2. Nifedipine 30 mg p.o. at bedtime. 3. Lisinopril 40 mg p.o.  4. Aspirin 81 mg p.o. at bedtime.  5. Premarin 0.625 mg/gm vaginal cream with applicator, one application twice a week.  6. Metformin 500 mg twice daily.  7. Fluticasone two sprays once daily.  8. Clobetasol 0.05% topical cream one application sparingly to external areas as needed.  9. Iron sulfate 325 mg p.o. daily.  10. Multivitamins once daily.  11. Hydrochlorothiazide 25 mg p.o. daily.  12. Crestor 40 mg p.o. at bedtime.  13. Fish oil 1 capsule which 1 gram twice daily.  14. Advil 400 mg p.o. every four hours as needed.   NEW MEDICATIONS: Crestor, fish oil, Advil and aspirin.   HOME OXYGEN: None.  DIET: 2 grams salt, 1800 ADA, low fat, low cholesterol.   ACTIVITY LIMITATIONS: As tolerated.   DISCHARGE FOLLOWUP: Follow-up with Dr. San Jetty at [facility] in 2 to 3 days after discharge.   CONSULTANTS: Cristopher Peru, MD - Neurology.   RADIOLOGIC STUDIES: Chest, PA and lateral, on 10/01/2011: No acute cardiopulmonary disease.   CT of head without contrast, on 10/01/2011: White matter changes consistent with chronic ischemia. No acute abnormality noted.   MRI of brain without contrast, on  10/02/2011: No evidence of acute ischemic event within limits of distorted diffusion-weighted sequences. There is no evidence of hydrocephalus or intracranial mass effect. Extensive white matter signal abnormalities are consistent with chronic small vessel ischemia. Signal within the pons and medulla is within normal limits.   Left lower extremity Doppler ultrasound, 10/01/2011: Negative examination for lower extremity deep venous thrombosis.   Doppler ultrasound of carotid arteries bilaterally, on 10/03/2011: There is some atherosclerotic disease present. However, there is no evidence of hemodynamically significant stenosis.   Echocardiogram results are not available at this time. However, according to Dr. Glennis Brink note, it is negative/normal.   HISTORY/HOSPITAL COURSE: The patient is a 59 year old African American female who presented to the hospital with complaints of headaches as well as left-sided facial numbness and left arm and left leg numbness as well as tingling. Please refer to Dr. Patricia Pesa Mody's admission note on 10/01/2011.   On arrival to the emergency room, the patient's temperature was 96.7, pulse 80, respiration rate 18, blood pressure 119/70, and saturation 96% on room air. Physical examination was unremarkable except to light touch due to numbness on the left side. However, on 10/02/2011, the patient was noted to have mild weakness of the left lower extremity.   She was admitted with concerns of stroke and stroke work-up was initiated. The patient underwent a carotid ultrasound as well as MRI of brain and echocardiogram studies. All of those studies were unremarkable and negative. The patient was consulted by neurologist, Dr. Sherryll Burger, who felt that the patient has likely complicated migraine versus stroke. He agreed with stroke work-up. He recommended not to give triptans  for migraine headaches. The patient was recommended to get higher doses of nonsteroidal anti-inflammatory medications  as well as antiemetic medications and to continue Topiramate. He recommended to continue presumed stroke treatment. For this reason, the patient was initiated on aspirin therapy. She was also advanced in regards to her Crestor and fish oil was added for lipid panel which revealed markedly elevated LDL to 181, cholesterol level of 257, triglycerides 203, and HDL of 35. The patient is to follow-up with her primary physician for further recommendations. She was evaluated by a physical therapist who recommended for the patient to return back home. On the day of discharge, however, 10/03/2011, the patient's left lower extremity weakness resolved as well as numbness has resolved except for numbness around her face and on the left side.   In regards to dizziness, it was felt to be due to hypotension. The patient was unfortunately given nifedipine as well as her usual dose of lisinopril in the morning instead of her usual that is taken at bedtime. For this reason, the patient's blood pressure was lower. Her blood pressure was ranging from 90s to 110s systolic. The patient was given IV fluids and her blood pressure somewhat improved. The patient was advised to take medications only at bedtime.   For diabetes mellitus, the patient's hemoglobin A1c was checked and was found to be 6.2. It was felt the patient would likely be okay if she was not even taking her usual doses of metformin and she was advised however to followup with her primary care physician and make decisions about metformin use if needed. The patient was managed on sliding scale insulin in the hospital and her blood sugars on 1800 ADA diet were ranging in the 80s to 90s.   The patient is being discharged in stable condition with the above-mentioned medications and follow-up.            VITAL SIGNS: On the day of discharge, her temperature is 98.4, pulse 77, respiratory rate 18, blood pressure 95 to 110, and oxygen saturation 97 to 98% on room  air at rest   TIME SPENT: 40 minutes. ____________________________ Katharina Caper, MD rv:slb D: 10/03/2011 17:48:01 ET T: 10/05/2011 09:56:35 ET JOB#: [id number]  cc: Katharina Caper, MD, <Dictator> Dr. San Jetty [facility]; Telephone: [phone]) Katharina Caper MD ELECTRONICALLY SIGNED 10/13/2011 15:25

## 2014-09-05 NOTE — Consult Note (Signed)
Past Medical/Surgical Hx:  ULCERS:   HTN:   GASTRIC BYPASS:   Home Medications: Medication Instructions Last Modified Date/Time  topiramate 50 mg oral tablet 1 tab(s) orally once a day (at bedtime) 20-May-13 16:31  NIFEdipine 30 mg oral tablet, extended release 1 tab(s) orally once a day (at bedtime) 20-May-13 16:31  lisinopril 40 mg oral tablet 1 tab(s) orally once a day (at bedtime) 20-May-13 16:31  aspirin 81 mg oral tablet 1 tab(s) orally once a day (at bedtime) 20-May-13 16:31  Premarin 0.625 mg/g vaginal cream with applicator 1 application vaginal 2 times a week 20-May-13 16:31  metformin 500 mg oral tablet 1 tab(s) orally every 12 hours 20-May-13 16:31  fluticasone 50 mcg/inh nasal spray 2 spray(s) nasal once a day 20-May-13 16:31  clobetasol 0.05% topical cream 1 application sparingly to external areas , As Needed 20-May-13 16:31  Crestor 20 mg oral tablet 1 tab(s) orally once a day (at bedtime) 20-May-13 16:31  ferrous sulfate 325 mg (65 mg elemental iron) oral tablet 1 tab(s) orally once a day (in the morning) 20-May-13 16:31  multivitamin 1 tab(s) orally once a day (at bedtime) 20-May-13 16:31  hydrochlorothiazide 25 mg oral tablet 1 tab(s) orally once a day 20-May-13 16:31   Allergies:  Percocet: Itching, Rash  Dilaudid: Alt Ment Status  Vital Signs: **Vital Signs.:   21-May-13 17:13   Vital Signs Type POCT   Temperature Temperature (F) 97.6   Celsius 36.4   Temperature Source oral   Pulse Pulse 85   Pulse source per Dinamap   Respirations Respirations 18   Systolic BP Systolic BP 488   Diastolic BP (mmHg) Diastolic BP (mmHg) 70   Mean BP 83   BP Source Dinamap   Pulse Ox % Pulse Ox % 99   Pulse Ox Activity Level  At rest   Oxygen Delivery Room Air/ 21 %   Nurse Fingerstick (mg/dL) FSBS (fasting range 65-99 mg/dL) 89   Comments/Interventions  Nurse Notified   Lab Results: Hepatic:  20-May-13 12:48    Bilirubin, Total 0.4   Alkaline Phosphatase 112   SGPT  (ALT) 22   SGOT (AST) 20   Total Protein, Serum   8.6   Albumin, Serum 3.4  Routine Chem:  20-May-13 12:48    Glucose, Serum   113   BUN   24   Creatinine (comp)   1.66   Sodium, Serum 141   Potassium, Serum 4.0   Chloride, Serum   108   CO2, Serum 23   Calcium (Total), Serum 8.6   Osmolality (calc) 286   eGFR (African American)   39   eGFR (Non-African American)   34   Anion Gap 10  21-May-13 06:14    Cholesterol, Serum   257   Triglycerides, Serum   203   HDL (INHOUSE)   35   VLDL Cholesterol Calculated   41   LDL Cholesterol Calculated   181    15:34    Hemoglobin A1c ([facility]) 6.2  Cardiac:  20-May-13 17:28    CK, Total 108   CPK-MB, Serum   < 0.5   Troponin I < 0.02  Routine UA:  20-May-13 13:00    Color (UA) Yellow   Clarity (UA) Turbid   Glucose (UA) Negative   Bilirubin (UA) Negative   Ketones (UA) Negative   Specific Gravity (UA) 1.017   Blood (UA) Negative   pH (UA) 5.0   Protein (UA) Negative   Nitrite (UA) Negative  Leukocyte Esterase (UA) Negative   WBC (UA) 2 /HPF   Mucous (UA) PRESENT   Amorphous Crystal (UA) PRESENT  Routine Hem:  20-May-13 12:48    WBC (CBC) 10.0   RBC (CBC)   5.55   Hemoglobin (CBC) 12.8   Hematocrit (CBC) 41.3   Platelet Count (CBC) 274   MCV   75   MCH   23.0   MCHC   30.9   RDW   17.6   Radiology Results: MRI:    21-May-13 19:08, MRI Brain Without Contrast   MRI Brain Without Contrast    REASON FOR EXAM:    cva  COMMENTS:       PROCEDURE: MR  - MR BRAIN WO CONTRAST  - Oct 02 2011  7:08PM     RESULT: The study is very limited due to the patient's metallic dental   braces. Gadolinium was not administered.    The diffusion-weighted images exhibit no evidence of acute ischemia   within the limits of the very distorted images. The inversion   recovery-FLAIR sequences reveal numerous foci of increased white matter   signal consistent with chronic small vessel ischemic type change. The T1   and T2 weighted images  exhibit no findings suspicious for intracranial   hemorrhage. There are no abnormal intra nor extra-axial fluid   collections. The paranasal sinuses could not be adequately evaluated. 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    IMPRESSION:   1. I do not see evidence of an acute ischemic event within the limits of   the distorted diffusion-weighted sequences.  2. There is no evidence of hydrocephalus norintracranial mass effect.  3. Extensive white matter signal abnormalities are consistent with   chronic small vessel ischemia. Signal within the pons and medulla is   within the limits of normal.          Verified By: DAVID A. Martinique, M.D., MD  CT:    20-May-13 17:30, CT Head Without Contrast   CT Head Without Contrast    REASON FOR EXAM:    numbness/tingling  COMMENTS:       PROCEDURE: CT  - CT HEAD WITHOUT CONTRAST  - Oct 01 2011  5:30PM     RESULT: History: Numbness.    Comparison Study: None.    Findings: No mass. No hydrocephalus. No hemorrhage. White matter changes   consistent with chronic ischemia. 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 Dural calcification present. No acute bony   abnormality.    IMPRESSION:  White matter changes consistent with chronic ischemia. No     acute abnormality.          Verified By: Osa Craver, M.D., MD   Impression/Recommendations:  Recommendations:   Please see my dictation for details.Likely complicated migraine vs stroke. Agree with stroke w/up. Won't give triptan for migraine HA, can give higher NSAID, anti-emetic etc. Conti topiramate.conti presumed stroke treatment.  for the opportunity to participate in care of your patient.will follow this patient with you during their hospitalization.   Electronic Signatures: Ray Church (MD)  (Signed 21-May-13 19:22)  Authored: PAST MEDICAL/SURGICAL HISTORY, HOME MEDICATIONS, ALLERGIES, NURSING VITAL SIGNS,  LAB RESULTS, RADIOLOGY RESULTS, Recommendations   Last Updated: 21-May-13 19:22 by Ray Church (MD)

## 2014-09-05 NOTE — Consult Note (Signed)
PATIENT NAME:  Monique Graham, TANENBAUM MR#:  [id number] DATE OF BIRTH:  Apr 25, 1956  DATE OF CONSULTATION:  10/02/2011  REFERRING PHYSICIAN:  Dr. Juliene Pina  CONSULTING PHYSICIAN:  Shakemia Madera K. Sherryll Burger, MD  REASON FOR CONSULTATION: Left hemibody numbness.   HISTORY OF PRESENT ILLNESS: Monique Graham is a 59 year old African American female who has been having severe "lightening bolt type" of headache for last one week since middle of May 2013.    During this headache she has new symptoms of numbness in the inner aspect of her left lower lip and tongue and the left side of the face, left lateral forearm and hand, left lateral leg and toes.   She feels like there is acid running inside. She also has difficulty with maintaining her balance, feeling dizzy, getting stumbled, having short term memory, occasionally having word finding difficulty.   Patient never had these symptoms with her previous migraine headaches.   Patient has known migraine headaches since around 2010 or so. During her typical headache she has pounding type of pain associated with photophobia, phonophobia, not much nausea, vomiting. Patient occasionally has wavy lines as her aura.   Patient was initially started on topiramate around three years ago which significantly helped her headache but then she discontinued topiramate and now her headaches are back.   Patient also has significant gum hypertrophy. Patient also has vascular risk factors in terms of diabetes, hypertension, secondhand smoke, hyperlipidemia, obesity.   PAST MEDICAL HISTORY:  1. Diabetes. 2. Hypertension.  3. Hyperlipidemia.  4. Migraine headaches. 5. Lichen sclerosus. 6. Renal insufficiency.   MEDICATIONS: I reviewed her home medication list.   PAST SURGICAL HISTORY: Gastric bypass surgery in 2005.   FAMILY HISTORY: Significant for breast cancer.   SOCIAL HISTORY: She does not use any tobacco, alcohol or drug abuse. Patient lives here in [place] but works in  [place].   REVIEW OF SYSTEMS: Positive for headache, numbness, feeling of imbalance, dizziness, vision changes, nausea, etc. Other 10 system review of system was asked and was found to be negative.   PHYSICAL EXAMINATION:  VITAL SIGNS: Temperature 97.6, pulse 85, pulse oximetry 99, respiratory rate 18, blood pressure 111/70.   Her most recent fingerstick glucose was 89.   MENTAL STATUS: She was alert, oriented, followed two-step inverted commands. Her attention, concentration, and memory seem to be appropriate for her age and medical condition.   Her funduscopic exam was unremarkable.   On her cranial nerves, her pupils are equal, round, and reactive. Extraocular movements are intact. Her face was symmetric. Tongue was midline. Facial sensations were intact. (Even though she has a feeling of numbness light touch was normal per patient.)   Her hearing was intact. Her shoulder shrug was 5/5.  On her motor exam she had equal strength bilaterally of 5/5. Her tone was normal. Her reflexes were symmetric and 5+.   Her toes were mute.   Her coordination was intact finger-to-nose. She had some stumbling when she tried to stand up.   LUNGS: Clear to auscultation.   HEART: S1, S2 heart sounds. Carotid exam did not reveal any bruit.   LABORATORY, DIAGNOSTIC AND RADIOLOGICAL DATA: Her CT scan of the head was unremarkable except she has quite thick inner table into the frontal bone but that seems to be chronic per patient.   ASSESSMENT AND PLAN:  1. Relatively subacute onset of left hemibody patchy numbness with other multiple neurological symptoms of feeling of dizziness, incoordination, imbalance, speech difficulty with severe headache likely complicated  migraine.   But patient does not have a history of focal neurological symptoms with her previous migraine headaches and with her multiple vascular risk factors she should have full stroke work-up.   In terms of her migraine medications,  she should be continued on prophylaxis with topiramate 50 mg but in future she can be increased dose once her other things have been ruled out.   Patient was informed about topiramate side effect such as possibility of kidney stone and closed angle glaucoma, burning sensation in her toes and fingers and around the lip, possibility of weight loss.   Patient was also informed about teratogenic side effects of topiramate as well as changing the test.   As a rescue medication patient is getting Tylenol. She can get an NSAID but patient was informed about medication rebound headache as well.   I do not think she is a good candidate for triptan at present.   2. Possibility of stroke. Patients with migraine with aura are at touch with higher risk of having cerebral ischemia and with her multiple vascular risk factors she should definitely have MRI of the brain. She should also have a carotid ultrasound. She is on telemetry monitoring.   She is on an antiplatelet agent with aspirin 325. She should take a statin. Patient was advised on importance of having good long-term blood pressure control and diabetes control.   Patient will try to work on her husband quitting smoking so she does not have secondhand smoke exposure.   3. Patient has history of gastric bypass surgery which puts her at the risk of peripheral neuropathy and other nutritional related neurological complications.   Patient should take multivitamin supplements.   ____________________________ Alezandra Egli K. Sherryll Burger, MD hks:cms D: 10/02/2011 19:31:00 ET T: 10/03/2011 09:20:32 ET JOB#: [id number]  cc: Sloan Takagi K. Sherryll Burger, MD, <Dictator> PCP in [place], [place] [facility] MD ELECTRONICALLY SIGNED 10/05/2011 8:05

## 2015-11-10 ENCOUNTER — Ambulatory Visit
Admit: 2015-11-10 | Discharge: 2015-11-10 | Payer: PRIVATE HEALTH INSURANCE | Attending: Specialist | Primary: Family Medicine

## 2015-11-10 DIAGNOSIS — M1711 Unilateral primary osteoarthritis, right knee: Secondary | ICD-10-CM

## 2015-11-10 MED ORDER — BUPIVACAINE (PF) 0.25 % (2.5 MG/ML) IJ SOLN
0.25 % (2.5 mg/mL) | Freq: Once | INTRAMUSCULAR | 0 refills | Status: AC
Start: 2015-11-10 — End: 2015-11-10

## 2015-11-10 MED ORDER — BETAMETHASONE ACET & SOD PHOS 6 MG/ML SUSP FOR INJECTION
6 mg/mL | Freq: Once | INTRAMUSCULAR | 0 refills | Status: AC
Start: 2015-11-10 — End: 2015-11-10

## 2015-11-10 NOTE — Progress Notes (Signed)
PATIENT PRESENTS FOR RIGHT KNEE PAIN X 1 MONTH

## 2015-11-10 NOTE — Progress Notes (Signed)
Patient: Yvonne Barton                MRN: [id number]       SSN: [id number]  Date of Birth: July 21, 1955        AGE: 60 y.o.        SEX: female    PCP: No primary care provider on file.  11/10/15    Chief Complaint   Patient presents with   ??? Knee Pain     RIGHT     HISTORY:  Yvonne Barton is a 60 y.o. female who is seen for right knee pain. She states that she experiences popping an cracking in her right knee. She states there is no history of injury and the pain first began 1 month ago. She notes the pain is burning and throbbing. She has increased pain after sitting. She experiences some relief when applying emu cream and taking Excedrin at night. She notes pain with standing and walking.     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 She moved to [facility] in November. She lives with her husband who is originally from this area. She does enjoy walking and recently began a ketogenic diet.  Current weight is 213 pounds.  She is 5'3.5" tall.  She is diabetic. She is hypertensive.     No results found for: HBA1C, HGBE8, HBA1CPOC, HBA1CEXT, HBA1CEXT  Weight Metrics 11/10/2015   Weight 213 lb   BMI 37.14 kg/m2       There is no problem list on file for this patient.    REVIEW OF SYSTEMS: All Below are Negative except: See HPI   Constitutional: negative for fever, chills, and weight loss.   Cardiovascular: negative for chest pain, claudication, leg swelling, SOB, DOE   Gastrointestinal: Negative for pain, N/V/C/D, Blood in stool or urine, dysuria,  hematuria, incontinence, pelvic pain.   Musculoskeletal: See HPI   Neurological: Negative for dizziness and weakness.    Negative for headaches, Visual changes, confusion, seizures   Phychiatric/Behavioral: Negative for depression, memory loss, substance  abuse.    Extremities: Negative for hair changes, rash, or skin lesion changes.   Hematologic: Negative for bleeding problems, bruising, pallor or swollen lymph  nodes   Peripheral Vascular: No calf pain, no circulation deficits.    Social History     Social History   ??? Marital status: UNKNOWN     Spouse name: N/A   ??? Number of children: N/A   ??? Years of education: N/A     Occupational History   ??? Not on file.     Social History Main Topics   ??? Smoking status: Never Smoker   ??? Smokeless tobacco: Never Used   ??? Alcohol use No   ??? Drug use: Not on file   ??? Sexual activity: Not on file     Other Topics Concern   ??? Not on file     Social History Narrative   ??? No narrative on file      Allergies   Allergen Reactions   ??? Percocet [Oxycodone-Acetaminophen] Anaphylaxis      Current Outpatient Prescriptions  Medication Sig   ??? amLODIPine (NORVASC) 10 mg tablet Take  by mouth daily.   ??? nebivolol (BYSTOLIC) 2.5 mg tablet Take  by mouth daily.   ??? hydroCHLOROthiazide (HYDRODIURIL) 12.5 mg tablet Take 12.5 mg by mouth daily.     No current facility-administered medications for this visit.       PHYSICAL EXAMINATION:  Visit Vitals   ??? BP 136/84   ??? Pulse 75   ??? Temp 97.1 ??F (36.2 ??C) (Oral)   ??? Ht 5' 3.5" (1.613 m)   ??? Wt 213 lb (96.6 kg)   ??? BMI 37.14 kg/m2      ORTHO EXAMINATION:  Examination Right knee Left knee   Skin Intact Intact   Range of motion 115-0,  With crepitation 120-0   Effusion - -   Medial joint line tenderness + -   Lateral joint line tenderness - -   Popliteal tenderness - -   Osteophytes palpable + +   McMurray???s - -   Patella crepitus + +   Anterior drawer - -   Lateral laxity - -   Medial laxity - -   Varus deformity - -   Valgus deformity - -   Pretibial edema - -   Calf tenderness - -      PROCEDURE:  After discussing treatment options, patient's right knee was  injected with 4 cc Marcaine and 1/2 cc Celestone.    Chart reviewed for the following:   I, Delrae Sawyers, MD, have reviewed the History, Physical and updated the Allergic reactions for Yvonne Barton performed immediately prior to start of procedure:  I, Delrae Sawyers, MD, have performed the following reviews on Yvonne Barton prior to the start of the procedure:            * Patient was identified by name and date of birth   * Agreement on procedure being performed was verified  * Risks and Benefits explained to the patient  * Procedure site verified and marked as necessary  * Patient was positioned for comfort  * Consent was obtained     Time: 1:58 PM     Date of procedure: 11/10/2015    Procedure performed by:  Delrae Sawyers, MD    Yvonne Barton tolerated the procedure well with no complications.      RADIOGRAPHS:  XR RIGHT KNEE 10/21/15  IMPRESSION: No acute abnormality in the right knee.   -I have independently reviewed these images during this office visit. -Dr. Madolyn Frieze  Per Dr. Madolyn Frieze Osteochondral defect on the medial femoral condyle    IMPRESSION:      ICD-10-CM ICD-9-CM    1. Primary osteoarthritis of right knee M17.11 715.16 betamethasone (CELESTONE SOLUSPAN) 6 mg/mL injection      BETAMETHASONE ACETATE & SODIUM PHOSPHATE INJECTION 3 MG EA.      DRAIN/INJECT LARGE JOINT/BURSA      bupivacaine, PF, (MARCAINE, PF,) 0.25 % (2.5 mg/mL) injection      PROCEDURE AUTHORIZATION TO SCHEDULER   2. Chronic pain of right knee M25.561 719.46 betamethasone (CELESTONE SOLUSPAN) 6 mg/mL injection    G89.29 338.29 BETAMETHASONE ACETATE & SODIUM PHOSPHATE INJECTION 3 MG EA.      DRAIN/INJECT LARGE JOINT/BURSA      bupivacaine, PF, (MARCAINE, PF,) 0.25 % (2.5 mg/mL) injection      PROCEDURE AUTHORIZATION TO SCHEDULER   3. BMI 37.0-37.9, adult Z68.37 V85.37    4. Osteonecrosis ([place]) M87.9 733.40  betamethasone (CELESTONE SOLUSPAN) 6 mg/mL injection       BETAMETHASONE ACETATE & SODIUM PHOSPHATE INJECTION 3 MG EA.      DRAIN/INJECT LARGE JOINT/BURSA      bupivacaine, PF, (MARCAINE, PF,) 0.25 % (2.5 mg/mL) injection      PROCEDURE AUTHORIZATION TO SCHEDULER    medial femoral condyle     PLAN:  After discussing treatment options, patient's right knee was injected with 4 cc Marcaine and 1/2 cc Celestone.  She will follow up as needed.  We discussed a possible right knee MRI in the future if pain continues.  Consider visco supplementation if pain continues.      Scribed by Lanora Manis ([place]) as dictated by Delrae Sawyers, MD

## 2015-11-10 NOTE — Patient Instructions (Addendum)
Knee Arthritis: Exercises  Your Care Instructions  Here are some examples of exercises for knee arthritis. Start each exercise slowly. Ease off the exercise if you start to have pain.  Your doctor or physical therapist will tell you when you can start these exercises and which ones will work best for you.  How to do the exercises  Knee flexion with heel slide    1. Lie on your back with your knees bent.  2. Slide your heel back by bending your affected knee as far as you can. Then hook your other foot around your ankle to help pull your heel even farther back.  3. Hold for about 6 seconds, then rest for up to 10 seconds.  4. Repeat 8 to 12 times.  5. Switch legs and repeat steps 1 through 4, even if only one knee is sore.  Quad sets    1. Sit with your affected leg straight and supported on the floor or a firm bed. Place a small, rolled-up towel under your knee. Your other leg should be bent, with that foot flat on the floor.  2. Tighten the thigh muscles of your affected leg by pressing the back of your knee down into the towel.  3. Hold for about 6 seconds, then rest for up to 10 seconds.  4. Repeat 8 to 12 times.  5. Switch legs and repeat steps 1 through 4, even if only one knee is sore.  Straight-leg raises to the front    1. Lie on your back with your good knee bent so that your foot rests flat on the floor. Your affected leg should be straight. Make sure that your low back has a normal curve. You should be able to slip your hand in between the floor and the small of your back, with your palm touching the floor and your back touching the back of your hand.  2. Tighten the thigh muscles in your affected leg by pressing the back of your knee flat down to the floor. Hold your knee straight.  3. Keeping the thigh muscles tight and your leg straight, lift your affected leg up so that your heel is about 12 inches off the floor. Hold for about 6 seconds, then lower slowly.   4. Relax for up to 10 seconds between repetitions.  5. Repeat 8 to 12 times.  6. Switch legs and repeat steps 1 through 5, even if only one knee is sore.  Active knee flexion    1. Lie on your stomach with your knees straight. If your kneecap is uncomfortable, roll up a washcloth and put it under your leg just above your kneecap.  2. Lift the foot of your affected leg by bending the knee so that you bring the foot up toward your buttock. If this motion hurts, try it without bending your knee quite as far. This may help you avoid any painful motion.  3. Slowly move your leg up and down.  4. Repeat 8 to 12 times.  5. Switch legs and repeat steps 1 through 4, even if only one knee is sore.  Quadriceps stretch (facedown)    1. Lie flat on your stomach, and rest your face on the floor.  2. Wrap a towel or belt strap around the lower part of your affected leg. Then use the towel or belt strap to slowly pull your heel toward your buttock until you feel a stretch.  3. Hold for about 15 to 30 seconds,   then relax your leg against the towel or belt strap.  4. Repeat 2 to 4 times.  5. Switch legs and repeat steps 1 through 4, even if only one knee is sore.  Stationary exercise bike    If you do not have a stationary exercise bike at home, you can find one to ride at your local health club or community center.  1. Adjust the height of the bike seat so that your knee is slightly bent when your leg is extended downward. If your knee hurts when the pedal reaches the top, you can raise the seat so that your knee does not bend as much.  2. Start slowly. At first, try to do 5 to 10 minutes of cycling with little to no resistance. Then increase your time and the resistance bit by bit until you can do 20 to 30 minutes without pain.  3. If you start to have pain, rest your knee until your pain gets back to the level that is normal for you. Or cycle for less time or with less effort.   Follow-up care is a key part of your treatment and safety. Be sure to make and go to all appointments, and call your doctor if you are having problems. It's also a good idea to know your test results and keep a list of the medicines you take.  Where can you learn more?  Go to http://www.healthwise.net/GoodHelpConnections.  Enter C159 in the search box to learn more about "Knee Arthritis: Exercises."  Current as of: August 02, 2015  Content Version: 11.3  ?? 2006-2017 [employer]. Care instructions adapted under license by [employer] (which disclaims liability or warranty for this information). If you have questions about a medical condition or this instruction, always ask your healthcare professional. [employer]wise, Inc[employer] disclaims any warranty or liability for your use of this information.       Joint Injections: Care Instructions  Your Care Instructions  Joint injections are shots into a joint, such as the knee. They may be used to put in medicines, such as pain relievers. Or they can be used to take out fluid. Sometimes the fluid is tested in a lab. This can help find the cause of a joint problem.  A corticosteroid, or steroid, shot is used to reduce inflammation in tendons or joints. It is often used to treat problems such as arthritis, tendinitis, and bursitis.  Steroids can be injected directly into a painful, inflamed joint. They can also help reduce inflammation of a bursa. A bursa is a sac of fluid. It cushions and lubricates areas where tendons, ligaments, skin, muscles, or bones rub against each other.  A steroid shot can sometimes help with short-term pain relief when other treatments haven't worked. If steroid shots help, pain may improve for weeks or months.  Follow-up care is a key part of your treatment and safety. Be sure to make and go to all appointments, and call your doctor if you are having problems. It's also a good idea to know your test results and keep a list  of the medicines you take.  How can you care for yourself at home?  ?? Put ice or a cold pack on the area for 10 to 20 minutes at a time. Put a thin cloth between the ice and your skin.  ?? Take anti-inflammatory medicines to reduce pain, swelling, or inflammation. These include ibuprofen (Advil, Motrin) and naproxen (Aleve). Read and follow all instructions on the label.  ??   Avoid strenuous activities for several days, especially those that put stress on the area where you got the shot.  ?? If you have dressings over the area, keep them clean and dry. You may remove them when your doctor tells you to.  When should you call for help?  Call your doctor now or seek immediate medical care if:  ?? You have signs of infection, such as:  ?? Increased pain, swelling, warmth, or redness.  ?? Red streaks leading from the site.  ?? Pus draining from the site.  ?? A fever.  Watch closely for changes in your health, and be sure to contact your doctor if you have any problems.  Where can you learn more?  Go to http://www.healthwise.net/GoodHelpConnections.  Enter N616 in the search box to learn more about "Joint Injections: Care Instructions."  Current as of: August 02, 2015  Content Version: 11.3  ?? 2006-2017 [employer]. Care instructions adapted under license by [employer] (which disclaims liability or warranty for this information). If you have questions about a medical condition or this instruction, always ask your healthcare professional. [employer], [employer] disclaims any warranty or liability for your use of this information.

## 2015-12-15 ENCOUNTER — Ambulatory Visit
Admit: 2015-12-15 | Discharge: 2015-12-15 | Payer: PRIVATE HEALTH INSURANCE | Attending: Specialist | Primary: Family Medicine

## 2015-12-15 DIAGNOSIS — M1711 Unilateral primary osteoarthritis, right knee: Secondary | ICD-10-CM

## 2015-12-15 MED ORDER — BETAMETHASONE ACET & SOD PHOS 6 MG/ML SUSP FOR INJECTION
6 mg/mL | Freq: Once | INTRAMUSCULAR | 0 refills | Status: AC
Start: 2015-12-15 — End: 2015-12-15

## 2015-12-15 MED ORDER — BUPIVACAINE (PF) 0.25 % (2.5 MG/ML) IJ SOLN
0.25 % (2.5 mg/mL) | Freq: Once | INTRAMUSCULAR | 0 refills | Status: AC
Start: 2015-12-15 — End: 2015-12-15

## 2015-12-15 NOTE — Progress Notes (Signed)
Patient: Yvonne Barton                MRN: [id number]       SSN: [id number]  Date of Birth: [birth year]        AGE: 60 y.o.        SEX: female    PCP: No primary care provider on file.  12/15/15    Chief Complaint   Patient presents with   ??? Knee Pain     right     HISTORY:  Yvonne Barton is a 60 y.o. female who is seen for right knee pain. She states that she experiences popping an cracking in her right knee. She states there is no history of injury and the pain first began 1 month ago. She notes the pain is burning and throbbing. She has increased pain after sitting. She experiences some relief when applying emu cream and taking Excedrin at night. She notes pain with standing and walking. She reports no relief with OTC NSAIDs. She received previous cortisone injections with little relief. She reports start up pain after sitting for long periods.     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 She moved to [facility] in November. She lives with her husband who is originally from this area. She does enjoy walking and recently began a ketogenic diet. She graduated with her [employer].D. From [employer]. She is hoping to teach at [employer] or [employer] soon. Current weight is 214 pounds.  She is 5'3.5" tall.  She is diabetic. She is hypertensive.     No results found for: HBA1C, HGBE8, HBA1CPOC, HBA1CEXT, HBA1CEXT  Weight Metrics 12/15/2015 11/10/2015   Weight 214 lb 3.2 oz 213 lb   BMI 37.35 kg/m2 37.14 kg/m2       There is no problem list on file for this patient.     REVIEW OF SYSTEMS: All Below are Negative except: See HPI   Constitutional: negative for fever, chills, and weight loss.   Cardiovascular: negative for chest pain, claudication, leg swelling, SOB, DOE   Gastrointestinal: Negative for pain, N/V/C/D, Blood in stool or urine, dysuria,  hematuria, incontinence, pelvic pain.   Musculoskeletal: See HPI   Neurological: Negative for dizziness and weakness.   Negative for headaches, Visual changes, confusion, seizures   Phychiatric/Behavioral: Negative for depression, memory loss, substance  abuse.    Extremities: Negative for hair changes, rash, or skin lesion changes.   Hematologic: Negative for bleeding problems, bruising, pallor or swollen lymph  nodes   Peripheral Vascular: No calf pain, no circulation deficits.    Social History     Social History   ??? Marital status: UNKNOWN     Spouse name: N/A   ??? Number of children: N/A   ??? Years of education: N/A     Occupational History   ??? Not on file.     Social History Main Topics   ??? Smoking status: Never Smoker   ??? Smokeless tobacco: Never Used   ??? Alcohol use No   ??? Drug use: Not on file   ???  Sexual activity: Not on file     Other Topics Concern   ??? Not on file     Social History Narrative      Allergies   Allergen Reactions   ??? Percocet [Oxycodone-Acetaminophen] Anaphylaxis      Current Outpatient Prescriptions   Medication Sig   ??? amLODIPine (NORVASC) 10 mg tablet Take  by mouth daily.   ??? nebivolol (BYSTOLIC) 2.5 mg tablet Take  by mouth daily.   ??? hydroCHLOROthiazide (HYDRODIURIL) 12.5 mg tablet Take 12.5 mg by mouth daily.     No current facility-administered medications for this visit.       PHYSICAL EXAMINATION:  Visit Vitals   ??? BP 126/77   ??? Pulse 71   ??? Temp 97.5 ??F (36.4 ??C)   ??? Resp 15   ??? Ht 5' 3.5" (1.613 m)   ??? Wt 214 lb 3.2 oz (97.2 kg)   ??? BMI 37.35 kg/m2      ORTHO EXAMINATION:  Examination Right knee Left knee   Skin Intact Intact   Range of motion 110-0,  With crepitation 120-0   Effusion 1+ -    Medial joint line tenderness + -   Lateral joint line tenderness - -   Popliteal tenderness - -   Osteophytes palpable + +   McMurray???s - -   Patella crepitus + +   Anterior drawer - -   Lateral laxity - -   Medial laxity - -   Varus deformity - -   Valgus deformity - -   Pretibial edema - -   Calf tenderness - -      PROCEDURE:  After discussing treatment options, patient's right knee was injected with 4 cc Marcaine and 1/2 cc Celestone.    Chart reviewed for the following:   I, Delrae Sawyers, MD, have reviewed the History, Physical and updated the Allergic reactions for [place] performed immediately prior to start of procedure:  I, Delrae Sawyers, MD, have performed the following reviews on [place] prior to the start of the procedure:            * Patient was identified by name and date of birth   * Agreement on procedure being performed was verified  * Risks and Benefits explained to the patient  * Procedure site verified and marked as necessary  * Patient was positioned for comfort  * Consent was obtained     Time: 11:24 AM     Date of procedure: 12/15/2015    Procedure performed by:  Delrae Sawyers, MD    Ms. Fritts tolerated the procedure well with no complications.      RADIOGRAPHS:  XR RIGHT KNEE 10/21/15  IMPRESSION: No acute abnormality in the right knee.   -I have independently reviewed these images during this office visit. -Dr. Madolyn Frieze  Per Dr. 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     IMPRESSION:      ICD-10-CM ICD-9-CM    1. Primary osteoarthritis of right knee M17.11 715.16 REFERRAL TO PHYSICAL THERAPY      betamethasone (CELESTONE SOLUSPAN) 6 mg/mL injection      BETAMETHASONE ACETATE & SODIUM PHOSPHATE INJECTION 3 MG EA.      DRAIN/INJECT LARGE JOINT/BURSA      bupivacaine, PF, (MARCAINE, PF,) 0.25 % (2.5 mg/mL) injection      PROCEDURE AUTHORIZATION TO SCHEDULER    2. Osteonecrosis ([place]) M87.9  733.40 REFERRAL TO PHYSICAL THERAPY      betamethasone (CELESTONE SOLUSPAN) 6 mg/mL injection      BETAMETHASONE ACETATE & SODIUM PHOSPHATE INJECTION 3 MG EA.      DRAIN/INJECT LARGE JOINT/BURSA      bupivacaine, PF, (MARCAINE, PF,) 0.25 % (2.5 mg/mL) injection      PROCEDURE AUTHORIZATION TO SCHEDULER   3. Chronic pain of right knee M25.561 719.46 REFERRAL TO PHYSICAL THERAPY    G89.29 338.29 betamethasone (CELESTONE SOLUSPAN) 6 mg/mL injection      BETAMETHASONE ACETATE & SODIUM PHOSPHATE INJECTION 3 MG EA.      DRAIN/INJECT LARGE JOINT/BURSA      bupivacaine, PF, (MARCAINE, PF,) 0.25 % (2.5 mg/mL) injection      PROCEDURE AUTHORIZATION TO SCHEDULER     PLAN:  After discussing treatment options, patient's right knee was injected with 4 cc Marcaine and 1/2 cc Celestone.  She will follow up as needed.  We discussed a possible right knee MRI in the future if pain continues.  Consider visco supplementation if pain continues. She has tried a variety of conservative measures including NSAIDs, injections, and activity restrictions all with incomplete temporary relief.  Consider aspiration next OV.  She will start a brief course of outpatient aquatic physical therapy to her right knee.      Scribed by Lanora Manis ([place]) as dictated by Delrae Sawyers, MD

## 2015-12-15 NOTE — Patient Instructions (Addendum)
Knee Arthritis: Exercises  Your Care Instructions  Here are some examples of exercises for knee arthritis. Start each exercise slowly. Ease off the exercise if you start to have pain.  Your doctor or physical therapist will tell you when you can start these exercises and which ones will work best for you.  How to do the exercises  Knee flexion with heel slide    1. Lie on your back with your knees bent.  2. Slide your heel back by bending your affected knee as far as you can. Then hook your other foot around your ankle to help pull your heel even farther back.  3. Hold for about 6 seconds, then rest for up to 10 seconds.  4. Repeat 8 to 12 times.  5. Switch legs and repeat steps 1 through 4, even if only one knee is sore.  Quad sets    1. Sit with your affected leg straight and supported on the floor or a firm bed. Place a small, rolled-up towel under your knee. Your other leg should be bent, with that foot flat on the floor.  2. Tighten the thigh muscles of your affected leg by pressing the back of your knee down into the towel.  3. Hold for about 6 seconds, then rest for up to 10 seconds.  4. Repeat 8 to 12 times.  5. Switch legs and repeat steps 1 through 4, even if only one knee is sore.  Straight-leg raises to the front    1. Lie on your back with your good knee bent so that your foot rests flat on the floor. Your affected leg should be straight. Make sure that your low back has a normal curve. You should be able to slip your hand in between the floor and the small of your back, with your palm touching the floor and your back touching the back of your hand.  2. Tighten the thigh muscles in your affected leg by pressing the back of your knee flat down to the floor. Hold your knee straight.  3. Keeping the thigh muscles tight and your leg straight, lift your affected leg up so that your heel is about 12 inches off the floor. Hold for about 6 seconds, then lower slowly.   4. Relax for up to 10 seconds between repetitions.  5. Repeat 8 to 12 times.  6. Switch legs and repeat steps 1 through 5, even if only one knee is sore.  Active knee flexion    1. Lie on your stomach with your knees straight. If your kneecap is uncomfortable, roll up a washcloth and put it under your leg just above your kneecap.  2. Lift the foot of your affected leg by bending the knee so that you bring the foot up toward your buttock. If this motion hurts, try it without bending your knee quite as far. This may help you avoid any painful motion.  3. Slowly move your leg up and down.  4. Repeat 8 to 12 times.  5. Switch legs and repeat steps 1 through 4, even if only one knee is sore.  Quadriceps stretch (facedown)    1. Lie flat on your stomach, and rest your face on the floor.  2. Wrap a towel or belt strap around the lower part of your affected leg. Then use the towel or belt strap to slowly pull your heel toward your buttock until you feel a stretch.  3. Hold for about 15 to 30 seconds,   then relax your leg against the towel or belt strap.  4. Repeat 2 to 4 times.  5. Switch legs and repeat steps 1 through 4, even if only one knee is sore.  Stationary exercise bike    If you do not have a stationary exercise bike at home, you can find one to ride at your local health club or community center.  1. Adjust the height of the bike seat so that your knee is slightly bent when your leg is extended downward. If your knee hurts when the pedal reaches the top, you can raise the seat so that your knee does not bend as much.  2. Start slowly. At first, try to do 5 to 10 minutes of cycling with little to no resistance. Then increase your time and the resistance bit by bit until you can do 20 to 30 minutes without pain.  3. If you start to have pain, rest your knee until your pain gets back to the level that is normal for you. Or cycle for less time or with less effort.   Follow-up care is a key part of your treatment and safety. Be sure to make and go to all appointments, and call your doctor if you are having problems. It's also a good idea to know your test results and keep a list of the medicines you take.  Where can you learn more?  Go to http://www.healthwise.net/GoodHelpConnections.  Enter C159 in the search box to learn more about "Knee Arthritis: Exercises."  Current as of: August 02, 2015  Content Version: 11.3  ?? 2006-2017 [employer]. Care instructions adapted under license by [employer] (which disclaims liability or warranty for this information). If you have questions about a medical condition or this instruction, always ask your healthcare professional. [employer]wise, Inc[employer] disclaims any warranty or liability for your use of this information.       Joint Injections: Care Instructions  Your Care Instructions  Joint injections are shots into a joint, such as the knee. They may be used to put in medicines, such as pain relievers. Or they can be used to take out fluid. Sometimes the fluid is tested in a lab. This can help find the cause of a joint problem.  A corticosteroid, or steroid, shot is used to reduce inflammation in tendons or joints. It is often used to treat problems such as arthritis, tendinitis, and bursitis.  Steroids can be injected directly into a painful, inflamed joint. They can also help reduce inflammation of a bursa. A bursa is a sac of fluid. It cushions and lubricates areas where tendons, ligaments, skin, muscles, or bones rub against each other.  A steroid shot can sometimes help with short-term pain relief when other treatments haven't worked. If steroid shots help, pain may improve for weeks or months.  Follow-up care is a key part of your treatment and safety. Be sure to make and go to all appointments, and call your doctor if you are having problems. It's also a good idea to know your test results and keep a list  of the medicines you take.  How can you care for yourself at home?  ?? Put ice or a cold pack on the area for 10 to 20 minutes at a time. Put a thin cloth between the ice and your skin.  ?? Take anti-inflammatory medicines to reduce pain, swelling, or inflammation. These include ibuprofen (Advil, Motrin) and naproxen (Aleve). Read and follow all instructions on the label.  ??   Avoid strenuous activities for several days, especially those that put stress on the area where you got the shot.  ?? If you have dressings over the area, keep them clean and dry. You may remove them when your doctor tells you to.  When should you call for help?  Call your doctor now or seek immediate medical care if:  ?? You have signs of infection, such as:  ?? Increased pain, swelling, warmth, or redness.  ?? Red streaks leading from the site.  ?? Pus draining from the site.  ?? A fever.  Watch closely for changes in your health, and be sure to contact your doctor if you have any problems.  Where can you learn more?  Go to http://www.healthwise.net/GoodHelpConnections.  Enter N616 in the search box to learn more about "Joint Injections: Care Instructions."  Current as of: August 02, 2015  Content Version: 11.3  ?? 2006-2017 [employer]. Care instructions adapted under license by [employer] (which disclaims liability or warranty for this information). If you have questions about a medical condition or this instruction, always ask your healthcare professional. [employer], [employer] disclaims any warranty or liability for your use of this information.

## 2015-12-26 ENCOUNTER — Inpatient Hospital Stay: Admit: 2015-12-26 | Payer: BLUE CROSS/BLUE SHIELD | Primary: Family Medicine

## 2015-12-26 DIAGNOSIS — M25561 Pain in right knee: Secondary | ICD-10-CM

## 2015-12-26 NOTE — Progress Notes (Signed)
[facility] ??? [facility] [place] [address]  [phone]  [phone] fax  Plan of Care/ Statement of Necessity for Physical Therapy Services     Patient name: Yvonne Barton Start of Care: 12/26/2015   Referral source: Delrae Sawyers, MD DOB: 1956/03/14    Medical Diagnosis: Pain in right knee [M25.561]  Unilateral primary osteoarthritis, right knee [M17.11]   Onset Date:2 months    Treatment Diagnosis: Right knee pain   Prior Hospitalization: see medical history Provider#: [id number]   Medications: Verified on Patient summary List    Comorbidities: allergies, high blood pressure, gastric bypass 2005   Prior Level of Function: Functionally independent, lives in upstairs loft with husband, enjoys fishing    The Plan of Care and following information is based on the information from the initial evaluation.  Assessment/ key information: Patient is a pleasant 60 year old female who presents with complaints of insidious onset Right knee pain two months ago. She states that her pain is worse with prolonged standing and walking, but also with prolonged sitting and getting up. No buckling noticed yet but distal quadriceps pain. Difficulty with stair negotiation in home. X-rays reveal arthritis and cartilage degeneration. At initial evaluation Patient demonstrates grossly full bilateral knee AROM and good LE flexibility overall. Left LE strength grossly 5/5, Right knee extension 4/5 with pain and hip flexion 4+/5, rest 5/5 with some pain. Tender to palpation at the distal Right quads and patellar tendon, medial knee joint line. Negative special tests for soft tissue involvement. Patient is a good rehab candidate based on premorbid status, and will benefit from skilled physical therapy in order to address the above deficits and improve knee stability.     Evaluation Complexity History LOW Complexity : Zero comorbidities /  personal factors that will impact the outcome / POC; Examination LOW Complexity : 1-2 Standardized tests and measures addressing body structure, function, activity limitation and / or participation in recreation  ;Presentation LOW Complexity : Stable, uncomplicated  ;Clinical Decision Making MEDIUM Complexity : FOTO score of 26-74  Overall Complexity Rating: LOW   Problem List: pain affecting function, decrease ROM, decrease strength, edema affecting function, impaired gait/ balance, decrease ADL/ functional abilitiies, decrease activity tolerance, decrease flexibility/ joint mobility and decrease transfer abilities   Treatment Plan may include any combination of the following: Therapeutic exercise, Therapeutic activities, Neuromuscular re-education, Physical agent/modality, Gait/balance training, Manual therapy, Aquatic therapy, Patient education, Self Care training, Functional mobility training, Home safety training and Stair training  Patient / Family readiness to learn indicated by: asking questions, trying to perform skills and interest  Persons(s) to be included in education: patient (P)  Barriers to Learning/Limitations: None  Patient Goal (s): ???stop knee pain???  Patient Self Reported Health Status: good  Rehabilitation Potential: good    Short Term Goals: To be accomplished in 1 weeks:  Goal: Patient will initiate aquatic therapy without incident or increase in pain in order to progress toward long term goals.  Status at last note/certification: N/a  Long Term Goals: To be accomplished in 10 treatments:  Goal: Patient will improve FOTO assessment score to at least 67 in order to indicate improved functional abilities.  Status at last note/certification: 51  Goal: Patient will improve Right knee extension strength to 5/5 without increase in pain in order to improve stability with weight bearing activities.  Status at last note/certification: 4/5 with pain   Goal: Patient will report ability to  negotiate stairs with reciprocal step pattern and no stops in order to improve ease of entering/exiting home.  Status at last note/certification: Has to stop due to pain, step to  Goal: Patient will report worst knee pain as 5/10 or less in order to progress toward personal goals.  Status at last note/certification: [id number]    Frequency / Duration: Patient to be seen 2 times per week for 10 treatments.    Patient/ Caregiver education and instruction: Diagnosis, prognosis, other patient education on benefits of aquatic therapy     Plan of care has been reviewed with PTA    Bonnielee Haff, PT 12/26/2015 11:43 AM  _____________________________________________________________________  I certify that the above Therapy Services are being furnished while the patient is under my care. I agree with the treatment plan and certify that this therapy is necessary.    Physician's Signature:____________________  Date:__________Time:______    Please sign and return to [facility] ??? [facility] [address]  [phone]   [phone] fax

## 2015-12-26 NOTE — Progress Notes (Signed)
PT DAILY TREATMENT NOTE 12-16    Patient Name: Yvonne Barton  Date:12/26/2015  DOB: 02/23/1956    Patient DOB Verified  Payor: BLUE CROSS / Plan: [place] / Product Type: PPO /    In time:1105  Out time:1140  Total Treatment Time (min): 35  Visit #: 1 of 10    Treatment Area: Pain in right knee [M25.561]  Unilateral primary osteoarthritis, right knee [M17.11]    SUBJECTIVE  Pain Level (0-10 scale): 4  Any medication changes, allergies to medications, adverse drug reactions, diagnosis change, or new procedure performed?:  No     Yes (see summary sheet for update)  Subjective functional status/changes:    No changes reported      OBJECTIVE    Modality rationale:    Min Type Additional Details     Estim:  Unatt       IFC  Premod                        Other:  w/ice   w/heat  Position:  Location:     Estim: Att    TENS instruct  NMES                    Other:  w/US   w/ice   w/heat  Position:  Location:      Traction:  Cervical       Lumbar                        Prone          Supine                       Intermittent   Continuous Lbs:   before manual   after manual      Ultrasound: Continuous    Pulsed                           1MHz   3MHz W/cm2:  Location:      Iontophoresis with dexamethasone         Location:  Take home patch    In clinic      Ice       heat    Ice massage    Laser     Anodyne Position:  Location:      Laser with stim    Other:  Position:  Location:      Vasopneumatic Device Pressure:        lo  med  hi   Temperature:  lo  med  hi    Skin assessment post-treatment:  intact redness- no adverse reaction    redness ??? adverse reaction:     27 min Eval                  Re-Eval       8 min Therapeutic Activity:    See flow sheet :   Rationale: Patient education on benefits of aquatic therapy  to improve the patient???s ability to improve overall function       With    TE    TA    neuro    other: Patient Education:  Review HEP     Progressed/Changed HEP based on:     positioning    body mechanics    transfers    heat/ice  application     other:      Other Objective/Functional Measures:      Pain Level (0-10 scale) post treatment: 4    ASSESSMENT/Changes in Function:     Patient will continue to benefit from skilled PT services to modify and progress therapeutic interventions, address functional mobility deficits, address ROM deficits, address strength deficits, analyze and address soft tissue restrictions, analyze and cue movement patterns, analyze and modify body mechanics/ergonomics, assess and modify postural abnormalities, address imbalance/dizziness and instruct in home and community integration to attain remaining goals.       See Plan of Care    See progress note/recertification    See Discharge Summary         Progress towards goals / Updated goals:  See POC    PLAN    Upgrade activities as tolerated       Continue plan of care    Update interventions per flow sheet         Discharge due to:_    Other:_      Bonnielee Haff, PT 12/26/2015  11:42 AM    Future Appointments  Date Time Provider [place]   12/29/2015 3:30 PM POOL [facility]   01/03/2016 3:00 PM [facility]OL [facility]   01/05/2016 3:30 PM [facility] [place]   01/10/2016 3:30 PM [facility] [place]   01/12/2016 3:15 PM [facility]OL [facility]

## 2015-12-29 ENCOUNTER — Inpatient Hospital Stay: Admit: 2015-12-29 | Payer: BLUE CROSS/BLUE SHIELD | Primary: Family Medicine

## 2016-01-03 ENCOUNTER — Inpatient Hospital Stay: Admit: 2016-01-03 | Payer: BLUE CROSS/BLUE SHIELD | Primary: Family Medicine

## 2016-01-05 ENCOUNTER — Inpatient Hospital Stay: Admit: 2016-01-05 | Payer: BLUE CROSS/BLUE SHIELD | Primary: Family Medicine

## 2016-01-10 ENCOUNTER — Inpatient Hospital Stay: Admit: 2016-01-10 | Payer: BLUE CROSS/BLUE SHIELD | Primary: Family Medicine

## 2016-01-12 ENCOUNTER — Inpatient Hospital Stay: Admit: 2016-01-12 | Payer: BLUE CROSS/BLUE SHIELD | Primary: Family Medicine

## 2016-01-17 ENCOUNTER — Encounter: Payer: BLUE CROSS/BLUE SHIELD | Primary: Family Medicine

## 2016-01-19 ENCOUNTER — Inpatient Hospital Stay: Admit: 2016-01-19 | Payer: BLUE CROSS/BLUE SHIELD | Primary: Family Medicine

## 2016-01-19 DIAGNOSIS — M25561 Pain in right knee: Secondary | ICD-10-CM

## 2016-01-19 NOTE — Progress Notes (Signed)
[facility] ??? [place] [address]  [phone]  [phone] fax    Progress Note  Patient name: Yvonne Barton Start of Care: 12/26/2015   Referral source: Delrae Sawyers, MD DOB: [birth year]                          Medical Diagnosis: Pain in right knee [M25.561]  Unilateral primary osteoarthritis, right knee [M17.11] Onset Date:2 months                          Treatment Diagnosis: Right knee pain   Prior Hospitalization: see medical history Provider#: [id number]   Medications: Verified on Patient summary List    Comorbidities: allergies, high blood pressure, gastric bypass 2005   Prior Level of Function: Functionally independent, lives in upstairs loft with husband, enjoys fishing    Visits from Start of Care: 6    Missed Visits: 2    Established Goals:          Excellent Good         Limited           None   Increased ROM            Increased Strength           Increased Mobility            Decreased Pain            Decreased Swelling            Short Term Goals: To be accomplished in 1 weeks:  Goal: Patient will initiate aquatic therapy without incident or increase in pain in order to progress toward long term goals.  Status at last note/certification: N/a  Current status: Met  Long Term Goals: To be accomplished in 10 treatments:  Goal: Patient will improve FOTO assessment score to at least 67 in order to indicate improved functional abilities.  Status at last note/certification: 51  Current status: Not met, 36  Goal: Patient will improve Right knee extension strength to 5/5 without increase in pain in order to improve stability with weight bearing activities.  Status at last note/certification: 4/5 with pain  Current status: Met  Goal: Patient will report ability to negotiate stairs with reciprocal step pattern and no stops in order to improve ease of entering/exiting home.  Status at last note/certification: Has to stop due to pain, step to   Current status: Progressing, continues to report pain leading to some stopping  Goal: Patient will report worst knee pain as 5/10 or less in order to progress toward personal goals.  Status at last note/certification: 69/48  Current status: Progressing, 9/10      Key Functional Changes:   Functional Gains: improved strength, less popping at night; upcoming injections  Functional Deficits: stairs, knee pain, limited walking tolerance ( a couple of blocks)  % improvement: 35%  Pain   Average: 6/10       Best: 5/10     Worst: 9/10  Patient Goal: "To stop hurting"  Updated Goals: to be achieved in 10 treatments:  Goal: Patient will improve FOTO assessment score to at least 67 in order to indicate improved functional abilities.  Status at last note/certification: 36  Goal: Patient will report ability to negotiate stairs with reciprocal step pattern and no stops in order to improve ease of entering/exiting home.  Status at last note/certification: Has to stop due to pain, step to  Goal: Patient will report worst knee pain as 5/10 or less in order to progress toward personal goals.  Status at last note/certification: 6/23    ASSESSMENT/RECOMMENDATIONS:Will try to transition to land as able  Continue therapy per initial plan/protocol at a frequency of  2 x per week for 5 weeks  Continue therapy with the following recommended changes:_____________________      _____________________________________________________________________  Discontinue therapy progressing towards or have reached established goals  Discontinue therapy due to lack of appreciable progress towards goals  Discontinue therapy due to lack of attendance or compliance  Await Physician's recommendations/decisions regarding therapy  Other:________________________________________________________________    Thank you for this referral.   Bonnielee Haff, PT 01/19/2016 3:47 PM  NOTE TO PHYSICIAN:  [place] AND    FAX TO [facility]: [phone]  If you are unable to process this request in 24 hours please contact our office: [phone]    I have read the above report and request that my patient continue as recommended.    I have read the above report and request that my patient continue therapy with the following changes/special instructions:________________________________________  I have read the above report and request that my patient be discharged from therapy.    Physician???s signature: ______________________________Date: ______Time:______

## 2016-01-24 ENCOUNTER — Inpatient Hospital Stay: Admit: 2016-01-24 | Payer: BLUE CROSS/BLUE SHIELD | Primary: Family Medicine

## 2016-01-26 ENCOUNTER — Inpatient Hospital Stay: Admit: 2016-01-26 | Payer: BLUE CROSS/BLUE SHIELD | Primary: Family Medicine

## 2016-01-26 ENCOUNTER — Encounter: Attending: Specialist | Primary: Family Medicine

## 2016-01-31 ENCOUNTER — Inpatient Hospital Stay: Admit: 2016-01-31 | Payer: BLUE CROSS/BLUE SHIELD | Primary: Family Medicine

## 2016-02-02 ENCOUNTER — Encounter: Attending: Specialist | Primary: Family Medicine

## 2016-02-02 ENCOUNTER — Inpatient Hospital Stay: Admit: 2016-02-02 | Payer: BLUE CROSS/BLUE SHIELD | Primary: Family Medicine

## 2016-02-02 NOTE — Progress Notes (Signed)
[facility] ??? [facility] [place] [address]  [phone]  [phone] fax    Discharge Summary    Patient name: Yvonne Barton Start of Care: 12/26/2015   Referral source: Delrae Sawyers, MD DOB: Feb 10, 1956??????????????????????????????????????????????   Medical Diagnosis: Pain in right knee [M25.561]  Unilateral primary osteoarthritis, right knee [M17.11] Onset Date:2 months??????????????????????????????????????????????   Treatment Diagnosis: Right knee pain   Prior Hospitalization: see medical history Provider#: [id number]   Medications: Verified on Patient summary List   ??Comorbidities: allergies, high blood pressure, gastric bypass 2005  ??Prior Level of Function: Functionally independent, lives in upstairs loft with husband, enjoys fishing    Visits from Start of Care: 10    Missed Visits: 4    Reporting Period : 12/26/15 to 02/02/16    Goal: Patient will improve FOTO assessment score to at least 67 in order to indicate improved functional abilities.  Status at last note/certification: 36  Status at discharge: Progressing, 52    Goal: Patient will report ability to negotiate stairs with reciprocal step pattern and no stops in order to improve ease of entering/exiting home.  Status at last note/certification: Has to stop due to pain, step to  Status at discharge: Not met, has to intermittently go sideways    Goal: Patient will report worst knee pain as 5/10 or less in order to progress toward personal goals.  Status at last note/certification: 0/09  Status at discharge: Progressing, less than 9/10    Assessment/ Summary of Care: Patient attended therapy for Right knee pain in the aquatic setting, reporting 35% improvement in function at most recent reassessment. At this time Patient has not been feeling well, and requests discharge. Current status unknown. Should she require further treatment in the future we would be pleased to treat her. Thank you for the referral of this Patient.     RECOMMENDATIONS:   Discontinue therapy: Patient has reached or is progressing toward set goals      Patient is non-compliant or has abdicated      Due to lack of appreciable progress towards set [place], PT 02/09/2016 12:58 PM

## 2016-02-07 ENCOUNTER — Inpatient Hospital Stay: Payer: BLUE CROSS/BLUE SHIELD | Primary: Family Medicine

## 2016-02-09 ENCOUNTER — Encounter: Attending: Specialist | Primary: Family Medicine

## 2016-02-09 ENCOUNTER — Inpatient Hospital Stay: Payer: BLUE CROSS/BLUE SHIELD | Primary: Family Medicine

## 2016-02-16 ENCOUNTER — Encounter: Attending: Specialist | Primary: Family Medicine

## 2016-02-23 ENCOUNTER — Encounter: Attending: Specialist | Primary: Family Medicine

## 2016-03-01 ENCOUNTER — Encounter: Attending: Specialist | Primary: Family Medicine

## 2017-07-23 ENCOUNTER — Encounter

## 2017-08-06 ENCOUNTER — Inpatient Hospital Stay: Payer: BLUE CROSS/BLUE SHIELD | Primary: Family Medicine

## 2017-08-15 ENCOUNTER — Inpatient Hospital Stay: Admit: 2017-08-15 | Payer: BLUE CROSS/BLUE SHIELD | Primary: Family Medicine

## 2017-08-15 DIAGNOSIS — Z1231 Encounter for screening mammogram for malignant neoplasm of breast: Secondary | ICD-10-CM

## 2017-09-12 ENCOUNTER — Encounter

## 2017-09-19 ENCOUNTER — Inpatient Hospital Stay: Admit: 2017-09-19 | Payer: BLUE CROSS/BLUE SHIELD | Attending: Obstetrics & Gynecology | Primary: Family Medicine

## 2017-09-19 DIAGNOSIS — R102 Pelvic and perineal pain: Secondary | ICD-10-CM

## 2018-05-09 ENCOUNTER — Inpatient Hospital Stay
Admit: 2018-05-09 | Discharge: 2018-05-10 | Disposition: A | Discharge status: Home / Self Care | Payer: BLUE CROSS/BLUE SHIELD | Attending: Emergency Medicine

## 2018-05-09 ENCOUNTER — Emergency Department: Admit: 2018-05-09 | Payer: BLUE CROSS/BLUE SHIELD | Primary: Family Medicine

## 2018-05-09 DIAGNOSIS — R109 Unspecified abdominal pain: Secondary | ICD-10-CM

## 2018-05-09 LAB — BASIC METABOLIC PANEL
Anion Gap: 6 mmol/L (ref 3.0–18)
BUN: 16 MG/DL (ref 7.0–18)
Bun/Cre Ratio: 16 (ref 12–20)
CO2: 26 mmol/L (ref 21–32)
Calcium: 8.6 MG/DL (ref 8.5–10.1)
Chloride: 109 mmol/L (ref 100–111)
Creatinine: 1.02 MG/DL (ref 0.6–1.3)
EGFR IF NonAfrican American: 55 mL/min/{1.73_m2} — ABNORMAL LOW (ref >60)
GFR African American: >60 mL/min/{1.73_m2} (ref >60)
Glucose: 83 mg/dL (ref 74–99)
Potassium: 3.6 mmol/L (ref 3.5–5.5)
Sodium: 141 mmol/L (ref 136–145)

## 2018-05-09 LAB — URINALYSIS W/ RFLX MICROSCOPIC
Bilirubin, Urine: NEGATIVE
Bilirubin: NEGATIVE
Blood, Urine: NEGATIVE
Blood: NEGATIVE
Glucose, Ur: NEGATIVE mg/dL
Glucose: NEGATIVE mg/dL
Leukocyte Esterase, Urine: NEGATIVE
Leukocyte Esterase: NEGATIVE
Nitrite, Urine: NEGATIVE
Nitrites: NEGATIVE
Protein, UA: NEGATIVE mg/dL
Protein: NEGATIVE mg/dL
Specific Gravity, UA: 1.023 (ref 1.005–1.030)
Specific gravity: 1.023 (ref 1.005–1.030)
Urobilinogen, UA, POCT: 1 EU/dL (ref 0.2–1.0)
Urobilinogen: 1 EU/dL (ref 0.2–1.0)
pH (UA): 5 (ref 5.0–8.0)
pH, UA: 5 (ref 5.0–8.0)

## 2018-05-09 LAB — CBC WITH AUTO DIFFERENTIAL
Basophils %: 0 % (ref 0–2)
Basophils Absolute: 0 10*3/uL (ref 0.0–0.1)
Eosinophils %: 2 % (ref 0–5)
Eosinophils Absolute: 0.2 10*3/uL (ref 0.0–0.4)
Hematocrit: 40.7 % (ref 35.0–45.0)
Hemoglobin: 13.3 g/dL (ref 12.0–16.0)
Lymphocytes %: 42 % (ref 21–52)
Lymphocytes Absolute: 3.9 10*3/uL — ABNORMAL HIGH (ref 0.9–3.6)
MCH: 25 PG (ref 24.0–34.0)
MCHC: 32.7 g/dL (ref 31.0–37.0)
MCV: 76.5 FL (ref 74.0–97.0)
Monocytes %: 7 % (ref 3–10)
Monocytes Absolute: 0.6 10*3/uL (ref 0.05–1.2)
Neutrophils %: 49 % (ref 40–73)
Neutrophils Absolute: 4.5 10*3/uL (ref 1.8–8.0)
Platelets: 217 10*3/uL (ref 135–420)
RBC: 5.32 M/uL — ABNORMAL HIGH (ref 4.20–5.30)
RDW: 16.5 % — ABNORMAL HIGH (ref 11.6–14.5)
WBC: 9.2 10*3/uL (ref 4.6–13.2)

## 2018-05-09 LAB — CBC WITH AUTOMATED DIFF
ABS. BASOPHILS: 0 10*3/uL (ref 0.0–0.1)
ABS. EOSINOPHILS: 0.2 10*3/uL (ref 0.0–0.4)
ABS. LYMPHOCYTES: 3.9 10*3/uL — ABNORMAL HIGH (ref 0.9–3.6)
ABS. MONOCYTES: 0.6 10*3/uL (ref 0.05–1.2)
ABS. NEUTROPHILS: 4.5 10*3/uL (ref 1.8–8.0)
BASOPHILS: 0 % (ref 0–2)
EOSINOPHILS: 2 % (ref 0–5)
HCT: 40.7 % (ref 35.0–45.0)
HGB: 13.3 g/dL (ref 12.0–16.0)
LYMPHOCYTES: 42 % (ref 21–52)
MCH: 25 PG (ref 24.0–34.0)
MCHC: 32.7 g/dL (ref 31.0–37.0)
MCV: 76.5 FL (ref 74.0–97.0)
MONOCYTES: 7 % (ref 3–10)
NEUTROPHILS: 49 % (ref 40–73)
PLATELET: 217 10*3/uL (ref 135–420)
RBC: 5.32 M/uL — ABNORMAL HIGH (ref 4.20–5.30)
RDW: 16.5 % — ABNORMAL HIGH (ref 11.6–14.5)
WBC: 9.2 10*3/uL (ref 4.6–13.2)

## 2018-05-09 LAB — METABOLIC PANEL, BASIC
Anion gap: 6 mmol/L (ref 3.0–18)
BUN/Creatinine ratio: 16 (ref 12–20)
BUN: 16 MG/DL (ref 7.0–18)
CO2: 26 mmol/L (ref 21–32)
Calcium: 8.6 MG/DL (ref 8.5–10.1)
Chloride: 109 mmol/L (ref 100–111)
Creatinine: 1.02 MG/DL (ref 0.6–1.3)
GFR est AA: >60 mL/min/{1.73_m2} (ref >60)
GFR est non-AA: 55 mL/min/{1.73_m2} — ABNORMAL LOW (ref >60)
Glucose: 83 mg/dL (ref 74–99)
Potassium: 3.6 mmol/L (ref 3.5–5.5)
Sodium: 141 mmol/L (ref 136–145)

## 2018-05-09 MED ORDER — IOPAMIDOL 61 % IV SOLN
300 mg iodine /mL (61 %) | Freq: Once | INTRAVENOUS | Status: AC
Start: 2018-05-09 — End: 2018-05-09
  Administered 2018-05-09: via INTRAVENOUS

## 2018-05-09 MED FILL — ISOVUE-300  61 % INTRAVENOUS SOLUTION: 300 mg iodine /mL (61 %) | INTRAVENOUS | Qty: 100

## 2018-05-09 NOTE — ED Notes (Signed)
Patient alert and orientated,   Complaining of dizziness, described as the room spinning.   Pt also states she has lower back pain

## 2018-05-09 NOTE — ED Provider Notes (Signed)
EMERGENCY DEPARTMENT HISTORY AND PHYSICAL EXAM    Date: 05/09/2018  Patient Name: Yvonne Barton    History of Presenting Illness     Chief Complaint   Patient presents with   ??? Dizziness   ??? Back Pain   ??? Headache         History Provided By: Patient        Additional History (Context): Charleen Madera is a 62 y.o. female with Previous history of essential hypertension, prediabetes, and chronic migraine who presents with with multiple complaints to include moderate to severe, left side headache x2 days consistent with prior migraines.  Patient rates his pain 9/10.  Patient did take ibuprofen for the headache yesterday and did report symptoms resolved.  Patient denies photophobia and nausea.  Patient states she usually takes ibuprofen when she gets a migraine and it usually works well.  However, patient reports the headache returned today.  Patient also complains of right-sided flank pain which is not worse with movement x1 month.  Patient states the pain radiates to the right upper quadrant and is not associated with food.  Patient did see her gastroenterologist for the symptoms and an EGD is planned for June 15, 2018.  Patient denies anorexia, associated symptoms to include nausea and vomiting, hematochezia, hemoptysis, bright red blood per rectum.  Patient states she had a colonoscopy in May of this year.  Last bowel movement was this morning was within normal limits.    PCP: Adair Patter, NP    Current Outpatient Medications   Medication Sig Dispense Refill   ??? methocarbamol (ROBAXIN) 500 mg tablet Take 1 Tab by mouth three (3) times daily as needed (muscle spasm). 20 Tab 0   ??? amLODIPine (NORVASC) 10 mg tablet Take  by mouth daily.     ??? nebivolol (BYSTOLIC) 2.5 mg tablet Take  by mouth daily.     ??? hydroCHLOROthiazide (HYDRODIURIL) 12.5 mg tablet Take 12.5 mg by mouth daily.         Past History     Past Medical History:  Past Medical History:   Diagnosis Date   ??? Hypertension    ??? Obesity         Past Surgical History:  Past Surgical History:   Procedure Laterality Date   ??? LAP GASTRIC BYPASS/ROUX-EN-Y         Family History:  History reviewed. No pertinent family history.    Social History:  Social History     Tobacco Use   ??? Smoking status: Never Smoker   ??? Smokeless tobacco: Never Used   Substance Use Topics   ??? Alcohol use: No   ??? Drug use: Not on file       Allergies:  Allergies   Allergen Reactions   ??? Percocet [Oxycodone-Acetaminophen] Anaphylaxis         Review of Systems   Review of Systems  Review of Systems   Constitutional: Negative for fatigue and fever.   HENT: Negative for congestion.    Respiratory: Negative for cough and shortness of breath.    Cardiovascular: Negative for chest pain.   Gastrointestinal: Positive for right side flank pain which radiates to the right upper quadrant without associated symptoms to include diarrhea, nausea and vomiting.   Genitourinary: Negative for difficulty urinating and dysuria.   Musculoskeletal: Negative joint pain, joint swelling, recent injury.  Skin: Negative for wound.   Neurological: Positive for dizziness and headache X 2 days.   All other systems reviewed and  are negative.       All Other Systems Negative  Physical Exam     Vitals:    05/09/18 1551 05/09/18 2058   BP: (!) 149/92 (!) 155/91   Pulse: 96 76   Resp: 12 18   Temp: 97.9 ??F (36.6 ??C) 97.6 ??F (36.4 ??C)   SpO2: 96% 98%   Weight: 109.8 kg (242 lb)    Height: 5\' 3"  (1.6 m)      Physical Exam     Constitutional: Pt is oriented to person, place, and time. Pt appears well-developed and well-nourished.   HENT:   Head: Normocephalic and atraumatic.   Mouth/Throat: Oropharynx is clear and moist.   Eyes: Pupils are equal, round, and reactive to light.   Neck: Normal range of motion. Neck supple. No tracheal deviation present. No thyromegaly present.   Cardiovascular: Normal rate, regular rhythm and normal heart sounds. No murmur heard.   Pulmonary/Chest: Effort normal and breath sounds normal. No respiratory distress. No wheezes or rales.   Abdominal: Soft.  No distension and no mass. There is mild tenderness in the RUQ without rebound and no guarding.  No CVA tenderness.  Musculoskeletal: Normal range of motion. No edema or deformity.   Neurological: Pt is alert and oriented to person, place, and time. CNII-XII grossly intact.  Skin: Skin is warm and dry.   Psychiatric: Pt has a normal mood and affect;  behavior is normal. Judgment and thought content normal.           Diagnostic Study Results     Labs -     Recent Results (from the past 12 hour(s))   URINALYSIS W/ RFLX MICROSCOPIC    Collection Time: 05/09/18  3:52 PM   Result Value Ref Range    Color YELLOW      Appearance CLOUDY      Specific gravity 1.023 1.005 - 1.030      pH (UA) 5.0 5.0 - 8.0      Protein NEGATIVE  NEG mg/dL    Glucose NEGATIVE  NEG mg/dL    Ketone TRACE (A) NEG mg/dL    Bilirubin NEGATIVE  NEG      Blood NEGATIVE  NEG      Urobilinogen 1.0 0.2 - 1.0 EU/dL    Nitrites NEGATIVE  NEG      Leukocyte Esterase NEGATIVE  NEG     CBC WITH AUTOMATED DIFF    Collection Time: 05/09/18  4:25 PM   Result Value Ref Range    WBC 9.2 4.6 - 13.2 K/uL    RBC 5.32 (H) 4.20 - 5.30 M/uL    HGB 13.3 12.0 - 16.0 g/dL    HCT 40.7 35.0 - 45.0 %    MCV 76.5 74.0 - 97.0 FL    MCH 25.0 24.0 - 34.0 PG    MCHC 32.7 31.0 - 37.0 g/dL    RDW 16.5 (H) 11.6 - 14.5 %    PLATELET 217 135 - 420 K/uL    NEUTROPHILS 49 40 - 73 %    LYMPHOCYTES 42 21 - 52 %    MONOCYTES 7 3 - 10 %    EOSINOPHILS 2 0 - 5 %    BASOPHILS 0 0 - 2 %    ABS. NEUTROPHILS 4.5 1.8 - 8.0 K/UL    ABS. LYMPHOCYTES 3.9 (H) 0.9 - 3.6 K/UL    ABS. MONOCYTES 0.6 0.05 - 1.2 K/UL    ABS. EOSINOPHILS 0.2 0.0 - 0.4 K/UL  ABS. BASOPHILS 0.0 0.0 - 0.1 K/UL    PLATELET COMMENTS LARGE PLATELETS      RBC COMMENTS ANISOCYTOSIS  1+        RBC COMMENTS TARGET CELLS  1+        DF AUTOMATED     EKG, 12 LEAD, INITIAL    Collection Time: 05/09/18  5:13 PM    Result Value Ref Range    Ventricular Rate 87 BPM    Atrial Rate 87 BPM    P-R Interval 142 ms    QRS Duration 82 ms    Q-T Interval 370 ms    QTC Calculation (Bezet) 445 ms    Calculated P Axis 36 degrees    Calculated R Axis -11 degrees    Calculated T Axis 68 degrees    Diagnosis       Normal sinus rhythm  Voltage criteria for left ventricular hypertrophy  Abnormal ECG  No previous ECGs available     METABOLIC PANEL, BASIC    Collection Time: 05/09/18  5:45 PM   Result Value Ref Range    Sodium 141 136 - 145 mmol/L    Potassium 3.6 3.5 - 5.5 mmol/L    Chloride 109 100 - 111 mmol/L    CO2 26 21 - 32 mmol/L    Anion gap 6 3.0 - 18 mmol/L    Glucose 83 74 - 99 mg/dL    BUN 16 7.0 - 18 MG/DL    Creatinine 1.02 0.6 - 1.3 MG/DL    BUN/Creatinine ratio 16 12 - 20      GFR est AA >60 >60 ml/min/1.30m2    GFR est non-AA 55 (L) >60 ml/min/1.8m2    Calcium 8.6 8.5 - 10.1 MG/DL       Radiologic Studies -   CT ABD PELV W CONT   Final Result   IMPRESSION:   1.  Lobulated renal contours bilaterally could reflect some scarring.   -No hydronephrosis or evidence of obstruction.   -2 mm nonobstructing right renal calcification.   -Subcentimeter possible medial left cortical cyst.      2. Hepatic steatosis. Splenic granuloma.      3. 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 No gastric or bowel   distention.      4. Descending and sigmoid diverticulosis without acute diverticulitis.      5. 4.5 cm right ovarian/adnexal cyst as above. No pelvic fluid or regional   inflammation.   -Would consider nonemergent ultrasound for further characterization given   patient's presumed postmenopausal status. Follow-up with ultrasound currently if   there is acute clinical concern or corresponding symptoms.        CT Results  (Last 48 hours)               05/09/18 1835  CT ABD PELV W CONT Final result    Impression:  IMPRESSION:   1.  Lobulated renal contours bilaterally could reflect some scarring.    -No hydronephrosis or evidence of obstruction.   -2 mm nonobstructing right renal calcification.   -Subcentimeter possible medial left cortical cyst.       2. Hepatic steatosis. Splenic granuloma.       3. 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 No gastric or bowel   distention.       4. Descending and sigmoid diverticulosis without acute diverticulitis.       5. 4.5 cm right ovarian/adnexal cyst as above. No pelvic fluid or regional   inflammation.   -  Would consider nonemergent ultrasound for further characterization given   patient's presumed postmenopausal status. Follow-up with ultrasound currently if   there is acute clinical concern or corresponding symptoms.       Narrative:  CT ABDOMEN AND PELVIS ENHANCED       CPT CODE: 29562 and [id number]       INDICATION: Right back and flank pain. Urinary urgency.       TECHNIQUE: 5 mm collimation axial images obtained from the diaphragm to the   level of the pubic symphysis following the uneventful administration of 100 cc's   nonionic intravenous contrast.       All CT scans at this facility are performed using dose optimization technique as   appropriate to this specific exam, to include automated exposure control,   adjustment of the mA and/or KP according to patient size or use of iterative   reconstruction techniques.       COMPARISON: None.       ABDOMEN FINDINGS:    No consolidation or effusion at the included lung bases. Several small   calcifications/granuloma posterior left lung base.        Liver diffusely low attenuation consistent with steatosis. 1.4 cm probable cyst   inferior segment 8.   -No intrahepatic biliary ductal dilation.   Gallbladder not distended. No calcified stones.   Pancreas unremarkable.   Spleen not enlarged. Multiple granuloma throughout        Kidneys symmetric. Mildly lobulated cortical contours bilaterally.   -56mm hypoattenuating lesion in medial left renal cortex on image 31. Possible   cyst but too small to characterize.    -No hydronephrosis.   -Possible 2 mm nonobstructing stone on the right image 22.   -No perinephric inflammatory change.       Postsurgical change from apparent gastric bypass. Mild small bowel distention at   the left abdominal anastomosis. No other bowel distention to suggest   obstruction.       PELVIS FINDINGS:    Mild rectal distention up to 4.3 cm filled with fecal material. Small volume   descending and sigmoid diverticulosis. No regional inflammatory change.    Appendix unremarkable.       Uterus remains, nonenlarged.   4.5 x 3.0 cm oval fluid attenuation lesion in the anterior right adnexa   consistent with a cyst. Appears fairly simple by CT but not optimally evaluated.   No calcifications. Patient should be postmenopausal given age.       Bladder unremarkable. No pelvic free fluid.   Tiny amount of fat protrudes at the umbilicus. No ventral hernia otherwise.   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                   CXR Results  (Last 48 hours)    None            Medical Decision Making   I am the first provider for this patient.    I reviewed the vital signs, available nursing notes, past medical history, past surgical history, family history and social history.    Vital Signs-Reviewed the patient's vital signs.       Comparison:    Records Reviewed: Nursing Notes and Old Medical Records    Procedures:  Procedures    Provider Notes (Medical Decision Making):   CT scan of abdomen pelvis with IV contrast for the patient's subacute right-sided flank pain with radiation of pain to the right upper  quadrant.  Patient understands radiation exposure is a side effect of the study but she does want to proceed forward.  CBC, CMP, EKG, all ordered.  UA is negative for blood or UTI.    I had a conversation with the patient prior to discharge in which we went over her CT results line by line.  She was concerned that no diagnosis  being made for her subacute right flank pain.  Incidental findings with the exception of one more found on the left side.  Right side incidental finding was a cyst on the right ovary which is not in the area of concern for patient.  She states she had an ultrasound with her primary care provider to evaluate this right ovary.  I will have patient follow-up with primary care to review this ultrasound.  She is comfortable with this plan  MED RECONCILIATION:  No current facility-administered medications for this encounter.      Current Outpatient Medications   Medication Sig   ??? methocarbamol (ROBAXIN) 500 mg tablet Take 1 Tab by mouth three (3) times daily as needed (muscle spasm).   ??? amLODIPine (NORVASC) 10 mg tablet Take  by mouth daily.   ??? nebivolol (BYSTOLIC) 2.5 mg tablet Take  by mouth daily.   ??? hydroCHLOROthiazide (HYDRODIURIL) 12.5 mg tablet Take 12.5 mg by mouth daily.       Disposition:  Home    DISCHARGE NOTE:     Pt has been reexamined.  Patient has no new complaints, changes, or physical findings.  Care plan outlined and precautions discussed.  Results of exa, CT and labs were reviewed with the patient. All medications were reviewed with the patient; will d/c home with follow up instructions. All of pt's questions and concerns were addressed. Patient was instructed and agrees to follow up with Dr Idelia Salm, as well as to return to the ED upon further deterioration. Patient is ready to go home.    Follow-up Information     Follow up With Specialties Details Why Contact Info    Adair Patter, NP Nurse Practitioner Schedule an appointment as soon as possible for a visit For outpatient ultrasound of the right ovary and possible ultrasound of left kidney. [address]  [place] [address]  [phone]      [facility] EMERGENCY DEPT Emergency Medicine  If symptoms worsen [place]  [phone]          Discharge Medication List as of 05/09/2018  9:17 PM       START taking these medications    Details   methocarbamol (ROBAXIN) 500 mg tablet Take 1 Tab by mouth three (3) times daily as needed (muscle spasm)., Print, Disp-20 Tab, R-0         CONTINUE these medications which have NOT CHANGED    Details   amLODIPine (NORVASC) 10 mg tablet Take  by mouth daily., Historical Med      nebivolol (BYSTOLIC) 2.5 mg tablet Take  by mouth daily., Historical Med      hydroCHLOROthiazide (HYDRODIURIL) 12.5 mg tablet Take 12.5 mg by mouth daily., Historical Med                 Diagnosis     Clinical Impression:   1. Muscular abdominal pain in right flank

## 2018-05-09 NOTE — ED Notes (Signed)
Discharge instructions reviewed with pt.  Pt voiced understanding.  Ambulated out of er

## 2018-05-09 NOTE — ED Triage Notes (Addendum)
"  I feel dizzy.  It started this morning.  My head has been hurting for the past two days.  I'm also having pain to the right side of my back off and on for about a month."  She is also complaining of urinary urgency.

## 2018-05-09 NOTE — Progress Notes (Signed)
CT waiting on creatinine results prior to scan of a/p

## 2018-05-09 NOTE — ED Notes (Signed)
Bedside shift change report given to Verita Lamb (oncoming nurse) by Deeann Cree., RN (offgoing nurse). Report included the following information SBAR, ED Summary, MAR and Recent Results.

## 2018-05-09 NOTE — ED Notes (Signed)
Bedside shift change report given to Raford Pitcher (oncoming nurse) by Verita Lamb., RN (offgoing nurse). Report included the following information SBAR, ED Summary, MAR and Recent Results.

## 2018-05-09 NOTE — ED Provider Notes (Signed)
ED Provider Notes by Orville Govern, PA at 05/09/18 1722                Author: Orville Govern, PA  Service: Emergency Medicine  Author Type: Physician Assistant       Filed: 05/10/18 0228  Date of Service: 05/09/18 1722  Status: Attested           Editor: Orville Govern, PA (Physician Assistant)  Cosigner: Darrold Span, DO at 05/10/18 1050          Attestation signed by Darrold Span, DO at 05/10/18 1050          I was personally available for consultation in the emergency department. I have reviewed the chart prior to the patient's discharge and agree with  the documentation recorded by the [facility], including the assessment, treatment plan, and disposition.                                    EMERGENCY DEPARTMENT HISTORY AND PHYSICAL EXAM      Date: 05/09/2018   Patient Name: Yvonne Barton        History of Presenting Illness          Chief Complaint       Patient presents with        ?  Dizziness     ?  Back Pain        ?  Headache              History Provided By: Patient            Additional History (Context): Yvonne Barton  is a 62 y.o. female with  Previous history of essential hypertension, prediabetes, and chronic migraine who presents with with multiple complaints to include moderate to severe, left side headache x2 days consistent with prior  migraines.  Patient rates his pain 9/10.  Patient did take ibuprofen for the headache yesterday and did report symptoms resolved.  Patient denies photophobia and nausea.  Patient states she usually takes ibuprofen when she gets a migraine and it usually  works well.  However, patient reports the headache returned today.  Patient also complains of right-sided flank pain which is not worse with movement x1 month.  Patient states the pain radiates to the right upper quadrant and is not associated with food.   Patient did see her gastroenterologist for the symptoms and an EGD is planned for June 15, 2018.  Patient denies anorexia, associated  symptoms to include nausea and vomiting, hematochezia, hemoptysis, bright red blood per rectum.  Patient states she  had a colonoscopy in May of this year.  Last bowel movement was this morning was within normal limits.      PCP: Adair Patter, NP        Current Outpatient Medications          Medication  Sig  Dispense  Refill           ?  methocarbamol (ROBAXIN) 500 mg tablet  Take 1 Tab by mouth three (3) times daily as needed (muscle spasm).  20 Tab  0     ?  amLODIPine (NORVASC) 10 mg tablet  Take  by mouth daily.         ?  nebivolol (BYSTOLIC) 2.5 mg tablet  Take  by mouth daily.               ?  hydroCHLOROthiazide (HYDRODIURIL) 12.5 mg tablet  Take 12.5 mg by mouth daily.                 Past History        Past Medical History:     Past Medical History:        Diagnosis  Date         ?  Hypertension           ?  Obesity             Past Surgical History:     Past Surgical History:         Procedure  Laterality  Date          ?  LAP GASTRIC BYPASS/ROUX-EN-Y               Family History:   History reviewed. No pertinent family history.      Social History:     Social History          Tobacco Use         ?  Smoking status:  Never Smoker     ?  Smokeless tobacco:  Never Used       Substance Use Topics         ?  Alcohol use:  No         ?  Drug use:  Not on file           Allergies:     Allergies        Allergen  Reactions         ?  Percocet [Oxycodone-Acetaminophen]  Anaphylaxis                Review of Systems     Review of Systems   Review of Systems    Constitutional: Negative for fatigue and fever.    HENT: Negative for congestion.     Respiratory: Negative for cough and shortness of breath.     Cardiovascular: Negative for chest pain.    Gastrointestinal: Positive for right side flank pain which radiates to the right upper quadrant without associated symptoms to include diarrhea, nausea and vomiting.    Genitourinary: Negative for difficulty urinating and dysuria.    Musculoskeletal: Negative joint pain,  joint swelling, recent injury.   Skin: Negative for wound.    Neurological: Positive for dizziness and headache X 2 days.    All other systems reviewed and are negative.          All Other Systems Negative     Physical Exam          Vitals:           05/09/18 1551  05/09/18 2058         BP:  (!) 149/92  (!) 155/91     Pulse:  96  76     Resp:  12  18     Temp:  97.9 ??F (36.6 ??C)  97.6 ??F (36.4 ??C)     SpO2:  96%  98%     Weight:  109.8 kg (242 lb)           Height:  5\' 3"  (1.6 m)          Physical Exam       Constitutional: Pt is oriented to person, place, and time. Pt appears well-developed and well-nourished.    HENT:    Head: Normocephalic and atraumatic.    Mouth/Throat: Oropharynx  is clear and moist.    Eyes: Pupils are equal, round, and reactive to light.    Neck: Normal range of motion. Neck supple. No tracheal deviation present. No thyromegaly present.    Cardiovascular: Normal rate, regular rhythm and normal heart sounds. No murmur heard.   Pulmonary/Chest: Effort normal and breath sounds normal. No respiratory distress. No wheezes or rales.    Abdominal: Soft.  No distension and no mass. There is mild tenderness in the RUQ without rebound and no guarding.  No CVA tenderness.  Musculoskeletal: Normal range of motion. No edema or deformity.   Neurological: Pt is alert and oriented to  person, place, and time. CNII-XII grossly intact.   Skin: Skin is warm and dry.   Psychiatric: Pt has a normal mood and affect;  behavior is normal. Judgment and thought content normal.                  Diagnostic Study Results        Labs -         Recent Results (from the past 12 hour(s))     URINALYSIS W/ RFLX MICROSCOPIC          Collection Time: 05/09/18  3:52 PM         Result  Value  Ref Range            Color  YELLOW          Appearance  CLOUDY          Specific gravity  1.023  1.005 - 1.030         pH (UA)  5.0  5.0 - 8.0         Protein  NEGATIVE   NEG mg/dL       Glucose  NEGATIVE   NEG mg/dL       Ketone  TRACE (A)   NEG mg/dL       Bilirubin  NEGATIVE   NEG         Blood  NEGATIVE   NEG         Urobilinogen  1.0  0.2 - 1.0 EU/dL       Nitrites  NEGATIVE   NEG         Leukocyte Esterase  NEGATIVE   NEG         CBC WITH AUTOMATED DIFF          Collection Time: 05/09/18  4:25 PM         Result  Value  Ref Range            WBC  9.2  4.6 - 13.2 K/uL       RBC  5.32 (H)  4.20 - 5.30 M/uL       HGB  13.3  12.0 - 16.0 g/dL       HCT  40.7  35.0 - 45.0 %       MCV  76.5  74.0 - 97.0 FL       MCH  25.0  24.0 - 34.0 PG       MCHC  32.7  31.0 - 37.0 g/dL       RDW  16.5 (H)  11.6 - 14.5 %       PLATELET  217  135 - 420 K/uL       NEUTROPHILS  49  40 - 73 %       LYMPHOCYTES  42  21 - 52 %  MONOCYTES  7  3 - 10 %       EOSINOPHILS  2  0 - 5 %       BASOPHILS  0  0 - 2 %       ABS. NEUTROPHILS  4.5  1.8 - 8.0 K/UL       ABS. LYMPHOCYTES  3.9 (H)  0.9 - 3.6 K/UL       ABS. MONOCYTES  0.6  0.05 - 1.2 K/UL       ABS. EOSINOPHILS  0.2  0.0 - 0.4 K/UL       ABS. BASOPHILS  0.0  0.0 - 0.1 K/UL       PLATELET COMMENTS  LARGE PLATELETS          RBC COMMENTS  ANISOCYTOSIS   1+             RBC COMMENTS  TARGET CELLS   1+             DF  AUTOMATED          EKG, 12 LEAD, INITIAL          Collection Time: 05/09/18  5:13 PM         Result  Value  Ref Range            Ventricular Rate  87  BPM       Atrial Rate  87  BPM       P-R Interval  142  ms       QRS Duration  82  ms       Q-T Interval  370  ms       QTC Calculation (Bezet)  445  ms       Calculated P Axis  36  degrees       Calculated R Axis  -11  degrees       Calculated T Axis  68  degrees       Diagnosis                 Normal sinus rhythm   Voltage criteria for left ventricular hypertrophy   Abnormal ECG   No previous ECGs available          METABOLIC PANEL, BASIC          Collection Time: 05/09/18  5:45 PM         Result  Value  Ref Range            Sodium  141  136 - 145 mmol/L       Potassium  3.6  3.5 - 5.5 mmol/L       Chloride  109  100 - 111 mmol/L       CO2  26  21 - 32 mmol/L        Anion gap  6  3.0 - 18 mmol/L       Glucose  83  74 - 99 mg/dL       BUN  16  7.0 - 18 MG/DL       Creatinine  1.02  0.6 - 1.3 MG/DL       BUN/Creatinine ratio  16  12 - 20         GFR est AA  >60  >60 ml/min/1.31m2       GFR est non-AA  55 (L)  >60 ml/min/1.52m2            Calcium  8.6  8.5 - 10.1  MG/DL           Radiologic Studies -      CT ABD PELV W CONT       Final Result     IMPRESSION:     1.  Lobulated renal contours bilaterally could reflect some scarring.     -No hydronephrosis or evidence of obstruction.     -2 mm nonobstructing right renal calcification.     -Subcentimeter possible medial left cortical cyst.          2. Hepatic steatosis. Splenic granuloma.          3. 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 No gastric or bowel     distention.          4. Descending and sigmoid diverticulosis without acute diverticulitis.          5. 4.5 cm right ovarian/adnexal cyst as above. No pelvic fluid or regional     inflammation.     -Would consider nonemergent ultrasound for further characterization given     patient's presumed postmenopausal status. Follow-up with ultrasound currently if     there is acute clinical concern or corresponding symptoms.                 CT Results   (Last 48 hours)                                    05/09/18 1835    CT ABD PELV W CONT  Final result            Impression:    IMPRESSION:      1.  Lobulated renal contours bilaterally could reflect some scarring.      -No hydronephrosis or evidence of obstruction.      -2 mm nonobstructing right renal calcification.      -Subcentimeter possible medial left cortical cyst.             2. Hepatic steatosis. Splenic granuloma.             3. 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 No gastric or bowel      distention.             4. Descending and sigmoid diverticulosis without acute diverticulitis.             5. 4.5 cm right ovarian/adnexal cyst as above. No pelvic fluid or regional      inflammation.      -Would  consider nonemergent ultrasound for further characterization given      patient's presumed postmenopausal status. Follow-up with ultrasound currently if      there is acute clinical concern or corresponding symptoms.                       Narrative:    CT ABDOMEN AND PELVIS ENHANCED             CPT CODE: [id number] and [id number]             INDICATION: Right back and flank pain. Urinary urgency.             TECHNIQUE: 5 mm collimation axial images obtained from the diaphragm to the      level of the pubic symphysis following the uneventful administration of 100 cc's      nonionic intravenous contrast.  All CT scans at this facility are performed using dose optimization technique as      appropriate to this specific exam, to include automated exposure control,      adjustment of the mA and/or KP according to patient size or use of iterative      reconstruction techniques.             COMPARISON: None.             ABDOMEN FINDINGS:       No consolidation or effusion at the included lung bases. Several small      calcifications/granuloma posterior left lung base.              Liver diffusely low attenuation consistent with steatosis. 1.4 cm probable cyst      inferior segment 8.      -No intrahepatic biliary ductal dilation.      Gallbladder not distended. No calcified stones.      Pancreas unremarkable.      Spleen not enlarged. Multiple granuloma throughout              Kidneys symmetric. Mildly lobulated cortical contours bilaterally.      -82mm hypoattenuating lesion in medial left renal cortex on image 31. Possible      cyst but too small to characterize.      -No hydronephrosis.      -Possible 2 mm nonobstructing stone on the right image 22.      -No perinephric inflammatory change.             Postsurgical change from apparent gastric bypass. Mild small bowel distention at      the left abdominal anastomosis. No other bowel distention to suggest      obstruction.             PELVIS FINDINGS:       Mild rectal  distention up to 4.3 cm filled with fecal material. Small volume      descending and sigmoid diverticulosis. No regional inflammatory change.       Appendix unremarkable.             Uterus remains, nonenlarged.      4.5 x 3.0 cm oval fluid attenuation lesion in the anterior right adnexa      consistent with a cyst. Appears fairly simple by CT but not optimally evaluated.      No calcifications. Patient should be postmenopausal given age.             Bladder unremarkable. No pelvic free fluid.      Tiny amount of fat protrudes at the umbilicus. No ventral hernia otherwise.      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                                        CXR Results   (Last 48 hours)          None                       Medical Decision Making     I am the first provider for this patient.      I reviewed the vital signs, available nursing notes, past medical history, past surgical history, family history and social history.      Vital Signs-Reviewed the patient's vital  signs.          Comparison:      Records Reviewed: Nursing Notes and Old Medical Records      Procedures:   Procedures      Provider Notes (Medical Decision Making):    CT scan of abdomen pelvis with IV contrast for the patient's subacute right-sided flank pain with radiation of pain to the right upper quadrant.  Patient understands radiation exposure is a side effect  of the study but she does want to proceed forward.  CBC, CMP, EKG, all ordered.  UA is negative for blood or UTI.      I had a conversation with the patient prior to discharge in which we went over her CT results line by line.  She was concerned that no diagnosis being made for her subacute right flank pain.  Incidental findings with the exception of one more found on  the left side.  Right side incidental finding was a cyst on the right ovary which is not in the area of concern for patient.  She states she had an ultrasound with her primary care  provider to evaluate this right ovary.  I will have patient follow-up  with primary care to review this ultrasound.  She is comfortable with this plan   MED RECONCILIATION:     No current facility-administered medications for this encounter.           Current Outpatient Medications        Medication  Sig         ?  methocarbamol (ROBAXIN) 500 mg tablet  Take 1 Tab by mouth three (3) times daily as needed (muscle spasm).     ?  amLODIPine (NORVASC) 10 mg tablet  Take  by mouth daily.     ?  nebivolol (BYSTOLIC) 2.5 mg tablet  Take  by mouth daily.         ?  hydroCHLOROthiazide (HYDRODIURIL) 12.5 mg tablet  Take 12.5 mg by mouth daily.           Disposition:   Home      DISCHARGE NOTE:       Pt has been reexamined.  Patient has no new complaints, changes, or physical findings.  Care plan outlined and precautions discussed.  Results of exa, CT and labs were reviewed with the patient. All medications were reviewed with the patient; will d/c  home with follow up instructions. All of pt's questions and concerns were addressed. Patient was instructed and agrees to follow up with Dr Idelia Salm, as well as to return to the ED upon further deterioration. Patient is ready to go home.        Follow-up Information               Follow up With  Specialties  Details  Why  Contact Info              Adair Patter, NP  Nurse Practitioner  Schedule an appointment as soon as possible for a visit  For outpatient ultrasound of the right ovary and possible ultrasound of left kidney.  [address]   [place] [address]   [phone]                 [facility] EMERGENCY DEPT  Emergency Medicine    If symptoms worsen  [place]   [phone]  Discharge Medication List as of 05/09/2018  9:17 PM              START taking these medications          Details        methocarbamol (ROBAXIN) 500 mg tablet  Take 1 Tab by mouth three (3) times daily as needed (muscle spasm)., Print, Disp-20 Tab, R-0                      CONTINUE these medications which have NOT CHANGED          Details        amLODIPine (NORVASC) 10 mg tablet  Take  by mouth daily., Historical Med               nebivolol (BYSTOLIC) 2.5 mg tablet  Take  by mouth daily., Historical Med               hydroCHLOROthiazide (HYDRODIURIL) 12.5 mg tablet  Take 12.5 mg by mouth daily., Historical Med                                 Diagnosis        Clinical Impression:       1.  Muscular abdominal pain in right flank

## 2018-05-09 NOTE — ED Notes (Signed)
"  I feel dizzy.  It started this morning.  My head has been hurting for the past two days.  I'm also having pain to the right side of my back off and on for about a month."  She is also complaining of urinary urgency.

## 2018-05-10 LAB — EKG 12-LEAD
Atrial Rate: 87 {beats}/min
Diagnosis: NORMAL
P Axis: 36 degrees
P-R Interval: 142 ms
Q-T Interval: 370 ms
QRS Duration: 82 ms
QTc Calculation (Bazett): 445 ms
R Axis: -11 degrees
T Axis: 68 degrees
Ventricular Rate: 87 {beats}/min

## 2018-05-10 LAB — EKG, 12 LEAD, INITIAL
Atrial Rate: 87 {beats}/min
Calculated P Axis: 36 degrees
Calculated R Axis: -11 degrees
Calculated T Axis: 68 degrees
Diagnosis: NORMAL
P-R Interval: 142 ms
Q-T Interval: 370 ms
QRS Duration: 82 ms
QTC Calculation (Bezet): 445 ms
Ventricular Rate: 87 {beats}/min

## 2018-05-10 MED ORDER — KETOROLAC TROMETHAMINE 15 MG/ML INJECTION
15 mg/mL | INTRAMUSCULAR | Status: AC
Start: 2018-05-10 — End: 2018-05-09
  Administered 2018-05-10: 03:00:00 via INTRAVENOUS

## 2018-05-10 MED ORDER — METHOCARBAMOL 500 MG TAB
500 mg | Freq: Once | ORAL | Status: AC
Start: 2018-05-10 — End: 2018-05-09
  Administered 2018-05-10: 03:00:00 via ORAL

## 2018-05-10 MED ORDER — METHOCARBAMOL 500 MG TAB
500 mg | ORAL_TABLET | Freq: Three times a day (TID) | ORAL | 0 refills | Status: DC | PRN
Start: 2018-05-10 — End: 2020-08-25

## 2018-05-10 MED FILL — KETOROLAC TROMETHAMINE 15 MG/ML INJECTION: 15 mg/mL | INTRAMUSCULAR | Qty: 1

## 2018-05-10 MED FILL — METHOCARBAMOL 500 MG TAB: 500 mg | ORAL | Qty: 1

## 2018-06-16 ENCOUNTER — Inpatient Hospital Stay: Payer: BLUE CROSS/BLUE SHIELD

## 2018-06-16 LAB — POCT GLUCOSE: POC Glucose: 89 mg/dL (ref 70–110)

## 2018-06-16 LAB — GLUCOSE, POC: Glucose (POC): 89 mg/dL (ref 70–110)

## 2018-06-16 MED ORDER — SODIUM CHLORIDE 0.9 % INJECTION
20 mg/2 mL | Freq: Once | INTRAMUSCULAR | Status: AC
Start: 2018-06-16 — End: 2018-06-16
  Administered 2018-06-16: 17:00:00 via INTRAVENOUS

## 2018-06-16 MED ORDER — SODIUM CHLORIDE 0.9 % IJ SYRG
INTRAMUSCULAR | Status: DC | PRN
Start: 2018-06-16 — End: 2018-06-16

## 2018-06-16 MED ORDER — LIDOCAINE (PF) 20 MG/ML (2 %) IJ SOLN
20 mg/mL (2 %) | INTRAMUSCULAR | Status: DC | PRN
Start: 2018-06-16 — End: 2018-06-16
  Administered 2018-06-16: 17:00:00 via INTRAVENOUS

## 2018-06-16 MED ORDER — LACTATED RINGERS IV
INTRAVENOUS | Status: DC
Start: 2018-06-16 — End: 2018-06-16
  Administered 2018-06-16: 17:00:00 via INTRAVENOUS

## 2018-06-16 MED ORDER — INSULIN LISPRO 100 UNIT/ML INJECTION
100 unit/mL | Freq: Once | SUBCUTANEOUS | Status: DC
Start: 2018-06-16 — End: 2018-06-16

## 2018-06-16 MED ORDER — SODIUM CHLORIDE 0.9 % IJ SYRG
Freq: Three times a day (TID) | INTRAMUSCULAR | Status: DC
Start: 2018-06-16 — End: 2018-06-16

## 2018-06-16 MED ORDER — PROPOFOL 10 MG/ML IV EMUL
10 mg/mL | INTRAVENOUS | Status: DC | PRN
Start: 2018-06-16 — End: 2018-06-16
  Administered 2018-06-16 (×2): via INTRAVENOUS

## 2018-06-16 MED FILL — FAMOTIDINE (PF) 20 MG/2 ML IV: 20 mg/2 mL | INTRAVENOUS | Qty: 2

## 2018-06-16 MED FILL — LACTATED RINGERS IV: INTRAVENOUS | Qty: 1000

## 2018-06-16 MED FILL — BD POSIFLUSH NORMAL SALINE 0.9 % INJECTION SYRINGE: INTRAMUSCULAR | Qty: 40

## 2018-06-16 NOTE — Procedures (Signed)
[email or web address] [address]      Brief Procedure Note    Yvonne Barton  March 02, 1956  [id number]    Date of Procedure: 06/16/2018    Preoperative diagnosis: Abdominal pain:   789.00 - R10.9  GERD:   530.81 - K21.9  Early Satiety:   780.94 - R68.81  Abnormal Feces:   787.7 - R19.5  Obesity, BMI OVER 30.  Instructed to work on weight loss:   278.02 - E66.3  Personal history of colon polyps:  V12.72 - Z86.010    Postoperative diagnosis: Esophageal ring, s/p gastric bypass duodenal bx's r /o  celiac dz, gastrix bx's r/o h pylori. Normal post bypass anotomy    Type of Anesthesia: MAC (Monitored anesthesia care)    Description of findings: same as post op dx    Procedure: Procedure(s):  UPPER ENDOSCOPY w bx's    Operator:  Dr. Haywood Filler, MD    Assistant(s): Endoscopy Technician-1: Phillips Hay  Endoscopy RN-1: Leighton Ruff, RN    MV[facility]:QION    Specimens:   ID Type Source Tests Collected by Time Destination   1 : small bowel bx's Preservative Small Bowel  Haywood Filler, MD 06/16/2018 1215 Pathology   2 : gastric pouch bx's Preservative Gastric  Haywood Filler, MD 06/16/2018 1221 Pathology       Findings: See printed and scanned procedure note    Complications: None    Dr. Haywood Filler, MD  06/16/2018  12:30 PM

## 2018-06-16 NOTE — Anesthesia Pre-Procedure Evaluation (Signed)
Relevant Problems   No relevant active problems       Anesthetic History   No history of anesthetic complications            Review of Systems / Medical History  Patient summary reviewed and pertinent labs reviewed    Pulmonary  Within defined limits                 Neuro/Psych   Within defined limits           Cardiovascular    Hypertension: well controlled                   GI/Hepatic/Renal  Within defined limits              Endo/Other    Diabetes: type 2    Morbid obesity     Other Findings              Physical Exam    Airway  Mallampati: II  TM Distance: 4 - 6 cm  Neck ROM: normal range of motion   Mouth opening: Normal     Cardiovascular               Dental  No notable dental hx       Pulmonary                 Abdominal  GI exam deferred       Other Findings            Anesthetic Plan    ASA: 3  Anesthesia type: MAC            Anesthetic plan and risks discussed with: Patient

## 2018-06-16 NOTE — H&P (Signed)
[email or web address]  [phone]    GASTROENTEROLOGY Pre-Procedure H and P      Impression/Plan:   1. This patient is consented for an EGD for GERD, abd pain, early satiety.      Chief Complaint: GERD, abd pain, early satiety.      HPI:  Yvonne Barton is a 63 y.o. female who presents for an EGD for evaluation of GERD, abd pain, early satiety.      PMH:   Past Medical History:   Diagnosis Date   ??? 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 Hypertension    ??? Kidney cysts    ??? Kidney stones    ??? Obesity    ??? Sarcoidosis unsure    pt states in remission "for a long time"       PSH:   Past Surgical History:   Procedure Laterality Date   ??? HX COLONOSCOPY     ??? HX ENDOSCOPY      "several times"   ??? HX TONSILLECTOMY  age 70   ??? LAP GASTRIC BYPASS/ROUX-EN-Y         Social HX:   Social History     Socioeconomic History   ??? Marital status: MARRIED     Spouse name: Not on file   ??? Number of children: Not on file   ??? Years of education: Not on file   ??? Highest education level: Not on file   Occupational History   ??? Not on file   Social Needs   ??? Financial resource strain: Not on file   ??? Food insecurity:     Worry: Not on file     Inability: Not on file   ??? Transportation needs:     Medical: Not on file     Non-medical: Not on file   Tobacco Use   ??? Smoking status: Never Smoker   ??? Smokeless tobacco: Never Used   Substance and Sexual Activity   ??? Alcohol use: No   ??? Drug use: Not on file   ??? Sexual activity: Not on file   Lifestyle   ??? Physical activity:     Days per week: Not on file     Minutes per session: Not on file   ??? Stress: Not on file   Relationships   ??? Social connections:     Talks on phone: Not on file     Gets together: Not on file     Attends religious service: Not on file     Active member of club or organization: Not on file     Attends meetings of clubs or organizations: Not on file     Relationship status: Not on file   ??? Intimate partner violence:     Fear of current or ex partner: Not on file      Emotionally abused: Not on file     Physically abused: Not on file     Forced sexual activity: Not on file   Other Topics Concern   ??? Not on file   Social History Narrative   ??? Not on file       FHX:   History reviewed. No pertinent family history.    Allergy:   Allergies   Allergen Reactions   ??? Percocet [Oxycodone-Acetaminophen] Anaphylaxis       Home Medications:     Medications Prior to Admission   Medication Sig   ??? spironolactone (ALDACTONE) 25 mg tablet Take 25  mg by mouth daily.   ??? conjugated estrogens (PREMARIN) 0.625 mg/gram vaginal cream Insert 0.5 g into vagina daily.   ??? acetaminophen (TYLENOL ARTHRITIS PAIN) 650 mg TbER Take 650 mg by mouth as needed.   ??? B.infantis-B.ani-B.long-B.bifi (PROBIOTIC 4X) 10-15 mg TbEC Take  by mouth daily. Phillips probiotic, unsure of dose   ??? amLODIPine (NORVASC) 10 mg tablet Take  by mouth daily.   ??? methocarbamol (ROBAXIN) 500 mg tablet Take 1 Tab by mouth three (3) times daily as needed (muscle spasm).   ??? nebivolol (BYSTOLIC) 2.5 mg tablet Take  by mouth daily.       Review of Systems:     A complete 10 point review of systems was performed and pertinents are as per the HPI. Remainder of the review of systems was negative.       Visit Vitals  Ht 5\' 2"  (1.575 m)   Wt 99.8 kg (220 lb)   Breastfeeding No   BMI 40.24 kg/m??       Physical Assessment:     General: alert, cooperative, no acute distress, appears stated age.  HEENT: normocephalic, no scleral icterus, moist mucous membranes, EOMs intact, no neck masses noted.  Respiratory: lungs clear to auscultation bilaterally.  Cardiovascular: regular rate and rhythm, no murmurs, rubs or gallops.  Abdomen: normal bowel sounds, soft, non-tender to palpation.  Extremities: no lower extremity edema, no cyanosis or clubbing.  Neuro: alert and oriented x 3; non-focal exam.  Skin: no rashes.  Psych: normal mood and affect.        Haywood Filler, MD  [facility]  [email or web address]   Phone: [phone]  Pager: [phone]  [email or web address]

## 2018-06-16 NOTE — Anesthesia Post-Procedure Evaluation (Signed)
Procedure(s):  UPPER ENDOSCOPY w bx's.    MAC    Anesthesia Post Evaluation      Multimodal analgesia: multimodal analgesia used between 6 hours prior to anesthesia start to PACU discharge  Patient location during evaluation: bedside  Patient participation: complete - patient participated  Level of consciousness: awake  Pain management: adequate  Airway patency: patent  Anesthetic complications: no  Cardiovascular status: stable  Respiratory status: acceptable  Hydration status: acceptable  Post anesthesia nausea and vomiting:  controlled      Vitals Value Taken Time   BP 113/51 06/16/2018 12:40 PM   Temp 36.8 ??C (98.3 ??F) 06/16/2018 12:30 PM   Pulse 75 06/16/2018 12:43 PM   Resp 17 06/16/2018 12:43 PM   SpO2 97 % 06/16/2018 12:43 PM   Vitals shown include unvalidated device data.

## 2018-06-16 NOTE — Anesthesia Post-Procedure Evaluation (Signed)
Procedure(s):  UPPER ENDOSCOPY w bx's.    MAC    Anesthesia Post Evaluation      Multimodal analgesia: multimodal analgesia used between 6 hours prior to anesthesia start to PACU discharge  Patient location during evaluation: bedside  Patient participation: complete - patient participated  Level of consciousness: awake  Pain management: adequate  Airway patency: patent  Anesthetic complications: no  Cardiovascular status: stable  Respiratory status: acceptable  Hydration status: acceptable  Post anesthesia nausea and vomiting:  controlled      Vitals Value Taken Time   BP 113/51 06/16/2018 12:40 PM   Temp 36.8 ??C (98.3 ??F) 06/16/2018 12:30 PM   Pulse 75 06/16/2018 12:43 PM   Resp 17 06/16/2018 12:43 PM   SpO2 97 % 06/16/2018 12:43 PM   Vitals shown include unvalidated device data.

## 2018-06-16 NOTE — Anesthesia Pre-Procedure Evaluation (Signed)
Relevant Problems   No relevant active problems       Anesthetic History   No history of anesthetic complications            Review of Systems / Medical History  Patient summary reviewed and pertinent labs reviewed    Pulmonary  Within defined limits                 Neuro/Psych   Within defined limits           Cardiovascular    Hypertension: well controlled                   GI/Hepatic/Renal  Within defined limits              Endo/Other    Diabetes: type 2    Morbid obesity     Other Findings              Physical Exam    Airway  Mallampati: II  TM Distance: 4 - 6 cm  Neck ROM: normal range of motion   Mouth opening: Normal     Cardiovascular               Dental  No notable dental hx       Pulmonary                 Abdominal  GI exam deferred       Other Findings            Anesthetic Plan    ASA: 3  Anesthesia type: MAC            Anesthetic plan and risks discussed with: Patient

## 2018-06-16 NOTE — H&P (Signed)
H&P by Haywood Filler,  MD at 06/16/18 1202                Author: Haywood Filler, MD  Service: Gastroenterology  Author Type: Physician       Filed: 06/16/18 1203  Date of Service: 06/16/18 1202  Status: Signed          Editor: Haywood Filler, MD (Physician)                    [email or web address]   [phone]      GASTROENTEROLOGY Pre-Procedure H and P           Impression/Plan:     1. This patient is consented for an EGD for GERD, abd pain, early satiety.         Chief Complaint: GERD, abd pain, early satiety.         HPI:   Yvonne Barton is a 63 y.o.  female who presents for an EGD for evaluation of GERD, abd pain, early satiety.         PMH:      Past Medical History:        Diagnosis  Date         ?  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  Hypertension       ?  Kidney cysts       ?  Kidney stones       ?  Obesity       ?  Sarcoidosis  unsure          pt states in remission "for a long time"           PSH:      Past Surgical History:         Procedure  Laterality  Date          ?  HX COLONOSCOPY         ?  HX ENDOSCOPY              "several times"          ?  HX TONSILLECTOMY    age 89          ?  LAP GASTRIC BYPASS/ROUX-EN-Y               Social HX:      Social History          Socioeconomic History         ?  Marital status:  MARRIED              Spouse name:  Not on file         ?  Number of children:  Not on file     ?  Years of education:  Not on file     ?  Highest education level:  Not on file       Occupational History        ?  Not on file       Social Needs         ?  Financial resource strain:  Not on file        ?  Food insecurity:              Worry:  Not on file         Inability:  Not on file        ?  Transportation needs:              Medical:  Not on file         Non-medical:  Not on file       Tobacco Use         ?  Smoking status:  Never Smoker     ?  Smokeless tobacco:  Never Used       Substance and Sexual Activity         ?  Alcohol use:  No     ?  Drug  use:  Not on file     ?  Sexual activity:  Not on file       Lifestyle        ?  Physical activity:              Days per week:  Not on file         Minutes per session:  Not on file         ?  Stress:  Not on file       Relationships        ?  Social connections:              Talks on phone:  Not on file         Gets together:  Not on file         Attends religious service:  Not on file         Active member of club or organization:  Not on file         Attends meetings of clubs or organizations:  Not on file         Relationship status:  Not on file        ?  Intimate partner violence:              Fear of current or ex partner:  Not on file         Emotionally abused:  Not on file         Physically abused:  Not on file         Forced sexual activity:  Not on file        Other Topics  Concern        ?  Not on file       Social History Narrative        ?  Not on file           FHX:    History reviewed. No pertinent family history.      Allergy:      Allergies        Allergen  Reactions         ?  Percocet [Oxycodone-Acetaminophen]  Anaphylaxis             Home Medications:          Medications Prior to Admission        Medication  Sig         ?  spironolactone (ALDACTONE) 25 mg tablet  Take 25 mg by mouth daily.     ?  conjugated estrogens (PREMARIN) 0.625 mg/gram vaginal cream  Insert 0.5 g into vagina daily.     ?  acetaminophen (TYLENOL ARTHRITIS PAIN) 650 mg TbER  Take 650 mg by mouth as needed.     ?  B.infantis-B.ani-B.long-B.bifi (PROBIOTIC 4X) 10-15 mg TbEC  Take  by mouth daily. Phillips probiotic, unsure  of dose     ?  amLODIPine (NORVASC) 10 mg tablet  Take  by mouth daily.     ?  methocarbamol (ROBAXIN) 500 mg tablet  Take 1 Tab by mouth three (3) times daily as needed (muscle spasm).         ?  nebivolol (BYSTOLIC) 2.5 mg tablet  Take  by mouth daily.             Review of Systems:        A complete 10 point review of systems was performed and pertinents are as per the HPI. Remainder of the review of  systems was negative.          Visit Vitals      Ht  5\' 2"  (1.575 m)     Wt  99.8 kg (220 lb)     Breastfeeding  No        BMI  40.24 kg/m??             Physical Assessment:        General: alert, cooperative, no acute distress, appears stated age.   HEENT: normocephalic, no scleral icterus, moist mucous membranes, EOMs intact, no neck masses noted.   Respiratory: lungs clear to auscultation bilaterally.   Cardiovascular: regular rate and rhythm, no murmurs, rubs or gallops.   Abdomen: normal bowel sounds, soft, non-tender to palpation.   Extremities: no lower extremity edema, no cyanosis or clubbing.   Neuro: alert and oriented x 3; non-focal exam.   Skin: no rashes.   Psych: normal mood and affect.            Haywood Filler, MD   [facility]   [email or web address]   Phone: [phone]   Pager: [phone]   [email or web address]

## 2018-06-16 NOTE — Procedures (Signed)
Procedures  by Haywood Filler, MD at 06/16/18 1229                Author: Haywood Filler, MD  Service: Gastroenterology  Author Type: Physician       Filed: 06/16/18 1230  Date of Service: 06/16/18 1229  Status: Signed          Editor: Haywood Filler, MD (Physician)            Pre-procedure Diagnoses        1. Abdominal pain, unspecified abdominal location [R10.9]        2. Early satiety [R68.81]                           Procedures        1. EGD [ZOX0960 (Custom)]                                            [email or web address] [address]         Brief Procedure Note      Yvonne Barton   1956/01/23   [id number]      Date of Procedure: 06/16/2018      Preoperative diagnosis: Abdominal pain:   789.00 - R10.9   GERD:   530.81 - K21.9   Early Satiety:   780.94 - R68.81   Abnormal Feces:   787.7 - R19.5   Obesity, BMI OVER 30.  Instructed to work on weight loss:   278.02 - E66.3   Personal history of colon polyps:  V12.72 - Z86.010      Postoperative diagnosis: Esophageal ring, s/p gastric bypass duodenal bx's r /o  celiac dz, gastrix bx's r/o h pylori. Normal post bypass anotomy      Type of Anesthesia: MAC (Monitored anesthesia care)      Description of findings: same as post op dx      Procedure: Procedure(s):   UPPER ENDOSCOPY w bx's      Operator:  Dr. Haywood Filler, MD      Assistant(s): Endoscopy Technician-1: Phillips Hay   Endoscopy RN-1: Leighton Ruff, RN      [email or web address]      Specimens:            ID  Type  Source  Tests  Collected by  Time  Destination             1 : small bowel bx's  Preservative  Small Bowel    Haywood Filler, MD  06/16/2018 1215  Pathology             2 : gastric pouch bx's  Preservative  Gastric    Haywood Filler, MD  06/16/2018 1221  Pathology           Findings: See printed and scanned procedure note      Complications: None      Dr. Haywood Filler, MD   06/16/2018  12:30 PM

## 2019-07-01 ENCOUNTER — Ambulatory Visit: Attending: Nurse Practitioner | Primary: Student in an Organized Health Care Education/Training Program

## 2019-07-01 ENCOUNTER — Encounter
Payer: PRIVATE HEALTH INSURANCE | Attending: Nurse Practitioner | Primary: Student in an Organized Health Care Education/Training Program

## 2019-07-01 ENCOUNTER — Ambulatory Visit
Admit: 2019-07-01 | Discharge: 2019-07-01 | Attending: Nurse Practitioner | Primary: Student in an Organized Health Care Education/Training Program

## 2019-07-01 DIAGNOSIS — N200 Calculus of kidney: Secondary | ICD-10-CM

## 2019-07-01 LAB — AMB POC URINALYSIS DIP STICK AUTO W/O MICRO
Bilirubin (UA POC): NEGATIVE
Bilirubin, Urine, POC: NEGATIVE
Glucose (UA POC): NEGATIVE
Glucose, Urine, POC: NEGATIVE
Ketones (UA POC): NEGATIVE
Ketones, Urine, POC: NEGATIVE
Nitrite, Urine, POC: NEGATIVE
Nitrites (UA POC): NEGATIVE
Protein (UA POC): NEGATIVE
Protein, Urine, POC: NEGATIVE
Specific Gravity, Urine, POC: 1.025 NA (ref 1.001–1.035)
Specific gravity (UA POC): 1.025 (ref 1.001–1.035)
Urobilinogen (UA POC): 0.2 (ref 0.2–1)
Urobilinogen, POC: 0.2 (ref 0.2–1)
pH (UA POC): 5 (ref 4.6–8.0)
pH, Urine, POC: 5 NA (ref 4.6–8.0)

## 2019-07-01 NOTE — Progress Notes (Signed)
ASSESSMENT:     1. Kidney Stone    Prior Surgery: None   Stone Analysis: None   Most Recent Imaging: RUS 03/09/2019-3 mm nonobstructing right renal calculus, superior pole.     Medical Therapy: None   Metabolic Workup: 24 hour urine- low urine output: hypocitruria     2. Hx of UTIs   No urine cx on file   On premarin cream   Taking probiotic             PLAN:    ?? Discussed and reviewed RUS. Stable 8mm non-obstructing right renal calculus. Will continue to monitor  ?? Discussed most recent 24 hour urine. Discussed increasing fluids and citric in diet. Recommend 2L of water with 1/2 cup of lemon juice.   ?? Discussed UTI prophylaxis including:  Cranberry supplements, probiotics or yogurt daily , D-mannose, Miralax daily for soft stools, drinking plenty of fluids, voiding every 2 to 3 hours, and estrogen cream 3x a week  ?? Send urine for culture. Will treat if positive for dysuria   ?? RTC in 1 year                Chief Complaint   Patient presents with   ??? Kidney Stone       HISTORY OF PRESENT ILLNESS:  Yvonne Barton is a 64 y.o. female who presents today for consultation and has been referred by NP Landry Mellow for kidney stone. The patient reports hx of frequent UTIs.  She underwent a RUS on 03/09/2019 that revealed a 31mm non-obstructing right superior pole stone.     Denies gross hematuria   Reports dysuria at times  Reports intermittent frequency and urgency   Nocturia x1  Denies STD  Denies f,c,n,v  Last UTI last month. Treated with Keflex     Married 37 years.       On premarin cream per Gyn   Hx of gout.       IMAGING and LABS:    RUS 03/09/2019    IMPRESSION:  1. 3 mm nonobstructing right renal calculus, superior pole.   2. Simple right midpole cyst.   3. Left kidney and bladder are normal in appearance.   FINDINGS:   Right Kidney: 9.0 x 4.1 x 4.3 cm in size.   Echogenicity: Normal   Parenchyma: Normal thickness and contour   No collecting system dilation or hydronephrosis.   Masses: No solid mass    Cysts: ??Simple midpole cyst measuring 0.8 x 0.7 x 0.9 cm. ??   Calculi: There is a small 3 mm nonobstructing superior pole calculus.     Left Kidney: 9.2 x 4.8 x 4.0 cm in size.   Echogenicity: Normal   Parenchyma: Normal thickness and contour   No collecting system dilation or hydronephrosis.   Masses: No solid mass   Cysts: ??No simple or complex cysts ??   Calculi: None present     Urinary Bladder: No wall thickening or intraluminal debris. Bilateral ureteral jets are present. Post void residual 5 ml.      CT abd/pelvis 05/09/2018  IMPRESSION:  1.  Lobulated renal contours bilaterally could reflect some scarring.  -No hydronephrosis or evidence of obstruction.  -2 mm nonobstructing right renal calcification.  -Subcentimeter possible medial left cortical cyst.  ??  2. Hepatic steatosis. Splenic granuloma.  ??  3. 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 No gastric or bowel  distention.  ??  4. Descending and sigmoid diverticulosis without acute diverticulitis.  ??  5.  4.5 cm right ovarian/adnexal cyst as above. No pelvic fluid or regional  inflammation.  -Would consider nonemergent ultrasound for further characterization given  patient's presumed postmenopausal status. Follow-up with ultrasound currently if  there is acute clinical concern or corresponding symptoms.  ??       Past Medical History:   Diagnosis Date   ??? 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 Hypercholesteremia    ??? Hypertension    ??? Kidney cysts    ??? Kidney stones    ??? Obesity    ??? Sarcoidosis unsure    pt states in remission "for a long time"       Past Surgical History:   Procedure Laterality Date   ??? HX COLONOSCOPY     ??? HX ENDOSCOPY      "several times"   ??? HX TONSILLECTOMY  age 14   ??? PR LAP GASTRIC BYPASS/ROUX-EN-Y         Social History     Tobacco Use   ??? Smoking status: Never Smoker   ??? Smokeless tobacco: Never Used   Substance Use Topics   ??? Alcohol use: No   ??? Drug use: Not on file       Allergies   Allergen Reactions    ??? Percocet [Oxycodone-Acetaminophen] Anaphylaxis       Family History   Problem Relation Age of Onset   ??? Cancer Mother    ??? Dementia Mother    ??? Stroke Father        Current Outpatient Medications   Medication Sig Dispense Refill   ??? losartan-hydroCHLOROthiazide (HYZAAR) 100-25 mg per tablet Take 1 Tab by mouth daily.     ??? allopurinoL (ZYLOPRIM) 100 mg tablet Take  by mouth daily.     ??? cloNIDine HCL (CATAPRES) 0.1 mg tablet Take  by mouth two (2) times a day.     ??? conjugated estrogens (PREMARIN) 0.625 mg/gram vaginal cream Insert 0.5 g into vagina daily.     ??? acetaminophen (TYLENOL ARTHRITIS PAIN) 650 mg TbER Take 650 mg by mouth as needed.     ??? B.infantis-B.ani-B.long-B.bifi (PROBIOTIC 4X) 10-15 mg TbEC Take  by mouth daily. Phillips probiotic, unsure of dose     ??? spironolactone (ALDACTONE) 25 mg tablet Take 25 mg by mouth daily.     ??? methocarbamol (ROBAXIN) 500 mg tablet Take 1 Tab by mouth three (3) times daily as needed (muscle spasm). 20 Tab 0   ??? amLODIPine (NORVASC) 10 mg tablet Take  by mouth daily.     ??? nebivolol (BYSTOLIC) 2.5 mg tablet Take  by mouth daily.             Review of Systems  Constitutional: Fever: No  Skin: Rash: No  HEENT: Hearing difficulty: No  Eyes: Blurred vision: No  Cardiovascular: Chest pain: No  Respiratory: Shortness of breath: No  Gastrointestinal: Nausea/vomiting: No  Musculoskeletal: Back pain: No  Neurological: Weakness: No  Psychological: Memory loss: No  Comments/additional findings:           PHYSICAL EXAMINATION:     Visit Vitals  Ht 5' 3.5" (1.613 m)   Wt 232 lb (105.2 kg)   BMI 40.45 kg/m??     Constitutional: Well developed, well-nourished female in no acute distress.   CV:  No peripheral swelling noted  Respiratory: No respiratory distress or difficulties  Abdomen:  Soft and nontender. No masses. No hepatosplenomegaly.   GU Female:  No CVA  tenderness.     Skin:  Normal color. No evidence of jaundice.      Neuro/Psych:  Patient with appropriate affect.  Alert and oriented.    Lymphatic:   No enlargement of supraclavicular lymph nodes.      REVIEW OF LABS AND IMAGING:      Results for orders placed or performed in visit on 07/01/19   AMB POC URINALYSIS DIP STICK AUTO W/O MICRO   Result Value Ref Range    Color (UA POC) Yellow     Clarity (UA POC) Clear     Glucose (UA POC) Negative Negative    Bilirubin (UA POC) Negative Negative    Ketones (UA POC) Negative Negative    Specific gravity (UA POC) 1.025 1.001 - 1.035    Blood (UA POC) Trace Negative    pH (UA POC) 5.0 4.6 - 8.0    Protein (UA POC) Negative Negative    Urobilinogen (UA POC) 0.2 mg/dL 0.2 - 1    Nitrites (UA POC) Negative Negative    Leukocyte esterase (UA POC) Trace Negative         A copy of today's office visit with all pertinent imaging results and labs were sent to the referring physician, NP Aundra Millet, NP-C  [facility] [place] [address]   Phone: [phone]    Fax: [phone]    [address] [place], [place]  P: [phone]   F: [phone]         ICD-10-CM ICD-9-CM    1. Stone in kidney  N20.0 592.0 AMB POC URINALYSIS DIP STICK AUTO W/O MICRO      URINE C&S

## 2019-07-01 NOTE — Progress Notes (Signed)
Progress Notes by Marlyn Corporal, NP at 07/01/19 0900                Author: Marlyn Corporal, NP  Service: --  Author Type: Nurse Practitioner       Filed: 07/01/19 1103  Encounter Date: 07/01/2019  Status: Signed          Editor: Marlyn Corporal, NP (Nurse Practitioner)                                       ASSESSMENT:       1. Kidney Stone     Prior Surgery: None    Stone Analysis: None    Most Recent Imaging: RUS 03/09/2019-3 mm nonobstructing right renal calculus, superior pole.      Medical Therapy: None    Metabolic Workup: 24 hour urine- low urine output: hypocitruria       2. Hx of UTIs    No urine cx on file    On premarin cream    Taking probiotic                   PLAN:     ??  Discussed and reviewed RUS. Stable 71mm non-obstructing right renal calculus. Will continue to monitor   ??  Discussed most recent 24 hour urine. Discussed increasing fluids and citric in diet. Recommend 2L of water with 1/2 cup of lemon juice.    ??  Discussed UTI prophylaxis including:  Cranberry supplements, probiotics or yogurt daily , D-mannose, Miralax daily for soft stools, drinking plenty of fluids, voiding every 2 to 3 hours, and estrogen cream 3x a week   ??  Send urine for culture. Will treat if positive for dysuria    ??  RTC in 1 year                          Chief Complaint       Patient presents with        ?  Kidney Stone           HISTORY OF PRESENT ILLNESS:  Yvonne Barton  is a 64 y.o. female who presents  today for consultation and has been referred by NP Landry Mellow for kidney stone. The patient reports hx of frequent UTIs.  She underwent a RUS on 03/09/2019 that revealed a 69mm non-obstructing right superior pole stone.       Denies gross hematuria    Reports dysuria at times   Reports intermittent frequency and urgency    Nocturia x1   Denies STD   Denies f,c,n,v   Last UTI last month. Treated with Keflex       Married 37 years.          On premarin cream per Gyn    Hx of gout.          IMAGING and  LABS:      RUS 03/09/2019      IMPRESSION:  1. 3 mm nonobstructing right renal calculus, superior pole.   2. Simple right midpole cyst.   3. Left kidney and bladder are normal in appearance.    FINDINGS:   Right Kidney: 9.0 x 4.1 x 4.3 cm in size.   Echogenicity: Normal   Parenchyma: Normal thickness and contour   No collecting system dilation or hydronephrosis.   Masses: No solid  mass   Cysts: ??Simple midpole cyst  measuring 0.8 x 0.7 x 0.9 cm. ??   Calculi: There is a small 3 mm nonobstructing superior pole calculus.     Left Kidney: 9.2 x 4.8 x 4.0 cm in size.   Echogenicity: Normal   Parenchyma: Normal thickness and contour   No collecting  system dilation or hydronephrosis.   Masses: No solid mass   Cysts: ??No simple or complex cysts ??   Calculi: None present     Urinary Bladder: No wall thickening or intraluminal debris. Bilateral ureteral jets are present. Post  void residual 5 ml.        CT abd/pelvis 05/09/2018   IMPRESSION:   1.  Lobulated renal contours bilaterally could reflect some scarring.   -No hydronephrosis or evidence of obstruction.   -2 mm nonobstructing right renal calcification.   -Subcentimeter possible medial left cortical cyst.   ??   2. Hepatic steatosis. Splenic granuloma.   ??   3. 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 No gastric or bowel   distention.   ??   4. Descending and sigmoid diverticulosis without acute diverticulitis.   ??   5. 4.5 cm right ovarian/adnexal cyst as above. No pelvic fluid or regional   inflammation.   -Would consider nonemergent ultrasound for further characterization given   patient's presumed postmenopausal status. Follow-up with ultrasound currently if   there is acute clinical concern or corresponding symptoms.  ??            Past Medical History:        Diagnosis  Date         ?  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  Hypercholesteremia       ?  Hypertension       ?  Kidney cysts       ?  Kidney stones       ?  Obesity        ?  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  HX COLONOSCOPY         ?  HX ENDOSCOPY              "several times"          ?  HX TONSILLECTOMY    age 33          ?  PR LAP GASTRIC BYPASS/ROUX-EN-Y                 Social History          Tobacco Use         ?  Smoking status:  Never Smoker     ?  Smokeless tobacco:  Never Used       Substance Use Topics         ?  Alcohol use:  No         ?  Drug use:  Not on file             Allergies        Allergen  Reactions         ?  Percocet [Oxycodone-Acetaminophen]  Anaphylaxis             Family History         Problem  Relation  Age of Onset          ?  Cancer  Mother       ?  Dementia  Mother            ?  Stroke  Father               Current Outpatient Medications          Medication  Sig  Dispense  Refill           ?  losartan-hydroCHLOROthiazide (HYZAAR) 100-25 mg per tablet  Take 1 Tab by mouth daily.         ?  allopurinoL (ZYLOPRIM) 100 mg tablet  Take  by mouth daily.         ?  cloNIDine HCL (CATAPRES) 0.1 mg tablet  Take  by mouth two (2) times a day.         ?  conjugated estrogens (PREMARIN) 0.625 mg/gram vaginal cream  Insert 0.5 g into vagina daily.         ?  acetaminophen (TYLENOL ARTHRITIS PAIN) 650 mg TbER  Take 650 mg by mouth as needed.         ?  B.infantis-B.ani-B.long-B.bifi (PROBIOTIC 4X) 10-15 mg TbEC  Take  by mouth daily. Phillips probiotic, unsure of dose         ?  spironolactone (ALDACTONE) 25 mg tablet  Take 25 mg by mouth daily.         ?  methocarbamol (ROBAXIN) 500 mg tablet  Take 1 Tab by mouth three (3) times daily as needed (muscle spasm).  20 Tab  0     ?  amLODIPine (NORVASC) 10 mg tablet  Take  by mouth daily.               ?  nebivolol (BYSTOLIC) 2.5 mg tablet  Take  by mouth daily.                     Review of Systems   Constitutional: Fever: No   Skin: Rash: No   HEENT: Hearing difficulty: No   Eyes: Blurred vision:  No   Cardiovascular: Chest pain: No   Respiratory: Shortness of breath: No   Gastrointestinal: Nausea/vomiting: No   Musculoskeletal: Back pain: No   Neurological: Weakness: No   Psychological: Memory loss: No   Comments/additional findings:                PHYSICAL EXAMINATION:       Visit Vitals      Ht  5' 3.5" (1.613 m)     Wt  232 lb (105.2 kg)        BMI  40.45 kg/m??        Constitutional: Well developed, well-nourished female in no acute distress.    CV:  No peripheral swelling noted   Respiratory: No respiratory distress or difficulties   Abdomen:  Soft and nontender. No masses. No hepatosplenomegaly.    GU Female:  No CVA tenderness.       Skin:  Normal color. No evidence of jaundice.      Neuro/Psych:  Patient with appropriate affect.  Alert and oriented.     Lymphatic:   No enlargement of supraclavicular lymph nodes.  REVIEW OF LABS AND IMAGING:          Results for orders placed or performed in visit on 07/01/19     AMB POC URINALYSIS DIP STICK AUTO W/O MICRO         Result  Value  Ref Range            Color (UA POC)  Yellow         Clarity (UA POC)  Clear         Glucose (UA POC)  Negative  Negative       Bilirubin (UA POC)  Negative  Negative       Ketones (UA POC)  Negative  Negative       Specific gravity (UA POC)  1.025  1.001 - 1.035       Blood (UA POC)  Trace  Negative       pH (UA POC)  5.0  4.6 - 8.0       Protein (UA POC)  Negative  Negative       Urobilinogen (UA POC)  0.2 mg/dL  0.2 - 1       Nitrites (UA POC)  Negative  Negative            Leukocyte esterase (UA POC)  Trace  Negative              A copy of today's office visit with all pertinent imaging results and labs were sent to the referring physician, NP Aundra Millet, NP-C   [facility] [place] [address]    Phone: [phone]     Fax: [phone]      [address] [place], [place]   P: [phone]    F: [phone]                     ICD-10-CM  ICD-9-CM             1.  Stone in kidney   N20.0  592.0  AMB POC URINALYSIS DIP STICK AUTO W/O MICRO                URINE C&S

## 2019-07-03 LAB — URINE C&S

## 2019-07-03 MED ORDER — NITROFURANTOIN (25% MACROCRYSTAL FORM) 100 MG CAP
100 mg | ORAL_CAPSULE | Freq: Two times a day (BID) | ORAL | 0 refills | Status: DC
Start: 2019-07-03 — End: 2020-01-11

## 2019-09-28 ENCOUNTER — Encounter

## 2019-10-02 ENCOUNTER — Encounter

## 2019-10-02 ENCOUNTER — Inpatient Hospital Stay
Admit: 2019-10-02 | Payer: MEDICARE | Attending: Obstetrics & Gynecology | Primary: Student in an Organized Health Care Education/Training Program

## 2019-10-02 DIAGNOSIS — N950 Postmenopausal bleeding: Secondary | ICD-10-CM

## 2019-12-07 ENCOUNTER — Inpatient Hospital Stay: Admit: 2019-12-07 | Payer: MEDICARE | Primary: Student in an Organized Health Care Education/Training Program

## 2019-12-07 ENCOUNTER — Encounter

## 2019-12-07 DIAGNOSIS — N200 Calculus of kidney: Secondary | ICD-10-CM

## 2019-12-07 LAB — URINALYSIS W/ RFLX MICROSCOPIC
Bilirubin, Urine: NEGATIVE
Bilirubin: NEGATIVE
Blood, Urine: NEGATIVE
Blood: NEGATIVE
Glucose, Ur: NEGATIVE mg/dL
Glucose: NEGATIVE mg/dL
Ketone: NEGATIVE mg/dL
Ketones, Urine: NEGATIVE mg/dL
Leukocyte Esterase, Urine: NEGATIVE
Leukocyte Esterase: NEGATIVE
Nitrite, Urine: NEGATIVE
Nitrites: NEGATIVE
Protein, UA: NEGATIVE mg/dL
Protein: NEGATIVE mg/dL
Specific Gravity, UA: 1.025 (ref 1.003–1.040)
Specific gravity: 1.025 (ref 1.003–1.040)
Urobilinogen, UA, POCT: 0.2 EU/dL
Urobilinogen: 0.2 EU/dL
pH (UA): 5.5
pH, UA: 5.5

## 2019-12-07 LAB — RENAL FUNCTION PANEL
Albumin: 3.6 g/dL (ref 3.4–5.0)
Albumin: 3.6 g/dL (ref 3.4–5.0)
Anion Gap: 4 mmol/L (ref 3.0–18)
Anion gap: 4 mmol/L (ref 3.0–18)
BUN/Creatinine ratio: 19 (ref 12–20)
BUN: 23 MG/DL — ABNORMAL HIGH (ref 7.0–18)
BUN: 23 MG/DL — ABNORMAL HIGH (ref 7.0–18)
Bun/Cre Ratio: 19 (ref 12–20)
CO2: 29 mmol/L (ref 21–32)
CO2: 29 mmol/L (ref 21–32)
Calcium: 9 MG/DL (ref 8.5–10.1)
Calcium: 9 MG/DL (ref 8.5–10.1)
Chloride: 107 mmol/L (ref 100–111)
Chloride: 107 mmol/L (ref 100–111)
Creatinine: 1.24 MG/DL (ref 0.6–1.3)
Creatinine: 1.24 MG/DL (ref 0.6–1.3)
EGFR IF NonAfrican American: 43 mL/min/{1.73_m2} — ABNORMAL LOW (ref >60)
GFR African American: 53 mL/min/{1.73_m2} — ABNORMAL LOW (ref >60)
GFR est AA: 53 mL/min/{1.73_m2} — ABNORMAL LOW (ref >60)
GFR est non-AA: 43 mL/min/{1.73_m2} — ABNORMAL LOW (ref >60)
Glucose: 97 mg/dL (ref 74–99)
Glucose: 97 mg/dL (ref 74–99)
Phosphorus: 3.7 MG/DL (ref 2.5–4.9)
Phosphorus: 3.7 MG/DL (ref 2.5–4.9)
Potassium: 3.9 mmol/L (ref 3.5–5.5)
Potassium: 3.9 mmol/L (ref 3.5–5.5)
Sodium: 140 mmol/L (ref 136–145)
Sodium: 140 mmol/L (ref 136–145)

## 2019-12-09 LAB — CULTURE, URINE
Colonies Counted: 30000
Colony Count: 30000

## 2019-12-24 ENCOUNTER — Inpatient Hospital Stay
Admit: 2019-12-24 | Payer: MEDICARE | Attending: Obstetrics & Gynecology | Primary: Student in an Organized Health Care Education/Training Program

## 2019-12-24 ENCOUNTER — Encounter

## 2019-12-24 ENCOUNTER — Inpatient Hospital Stay: Admit: 2019-12-24 | Payer: MEDICARE | Primary: Student in an Organized Health Care Education/Training Program

## 2019-12-24 DIAGNOSIS — Z01811 Encounter for preprocedural respiratory examination: Secondary | ICD-10-CM

## 2019-12-24 DIAGNOSIS — Z01818 Encounter for other preprocedural examination: Secondary | ICD-10-CM

## 2019-12-24 LAB — CBC WITH AUTO DIFFERENTIAL
Basophils %: 0.5 % (ref 0–3)
Eosinophils %: 3 % (ref 0–5)
Hematocrit: 39.5 % (ref 37.0–50.0)
Hemoglobin: 12.2 gm/dl — ABNORMAL LOW (ref 13.0–17.2)
Immature Granulocytes: 0.3 % (ref 0.0–3.0)
Lymphocytes %: 35.6 % (ref 28–48)
MCH: 23.8 pg — ABNORMAL LOW (ref 25.4–34.6)
MCHC: 30.9 gm/dl (ref 30.0–36.0)
MCV: 77.1 fL — ABNORMAL LOW (ref 80.0–98.0)
MPV: 12.8 fL — ABNORMAL HIGH (ref 6.0–10.0)
Monocytes %: 6.8 % (ref 1–13)
Neutrophils %: 53.8 % (ref 34–64)
Nucleated RBCs: 0 (ref 0–0)
Platelets: 248 10*3/uL (ref 140–450)
RBC: 5.12 M/uL (ref 3.60–5.20)
RDW-SD: 42 (ref 36.4–46.3)
WBC: 7.7 10*3/uL (ref 4.0–11.0)

## 2019-12-24 LAB — BASIC METABOLIC PANEL
Anion Gap: 4 mmol/L — ABNORMAL LOW (ref 5–15)
BUN: 20 mg/dl (ref 7–25)
CO2: 26 mEq/L (ref 21–32)
Calcium: 8.7 mg/dl (ref 8.5–10.1)
Chloride: 108 mEq/L — ABNORMAL HIGH (ref 98–107)
Creatinine: 1.4 mg/dl — ABNORMAL HIGH (ref 0.6–1.3)
EGFR IF NonAfrican American: 40
GFR African American: 49
Glucose: 88 mg/dl (ref 74–106)
Potassium: 3.6 mEq/L (ref 3.5–5.1)
Sodium: 138 mEq/L (ref 136–145)

## 2019-12-24 LAB — CBC WITH AUTOMATED DIFF
BASOPHILS: 0.5 % (ref 0–3)
EOSINOPHILS: 3 % (ref 0–5)
HCT: 39.5 % (ref 37.0–50.0)
HGB: 12.2 gm/dl — ABNORMAL LOW (ref 13.0–17.2)
IMMATURE GRANULOCYTES: 0.3 % (ref 0.0–3.0)
LYMPHOCYTES: 35.6 % (ref 28–48)
MCH: 23.8 pg — ABNORMAL LOW (ref 25.4–34.6)
MCHC: 30.9 gm/dl (ref 30.0–36.0)
MCV: 77.1 fL — ABNORMAL LOW (ref 80.0–98.0)
MONOCYTES: 6.8 % (ref 1–13)
MPV: 12.8 fL — ABNORMAL HIGH (ref 6.0–10.0)
NEUTROPHILS: 53.8 % (ref 34–64)
NRBC: 0 (ref 0–0)
PLATELET: 248 10*3/uL (ref 140–450)
RBC: 5.12 M/uL (ref 3.60–5.20)
RDW-SD: 42 (ref 36.4–46.3)
WBC: 7.7 10*3/uL (ref 4.0–11.0)

## 2019-12-24 LAB — METABOLIC PANEL, BASIC
Anion gap: 4 mmol/L — ABNORMAL LOW (ref 5–15)
BUN: 20 mg/dl (ref 7–25)
CO2: 26 mEq/L (ref 21–32)
Calcium: 8.7 mg/dl (ref 8.5–10.1)
Chloride: 108 mEq/L — ABNORMAL HIGH (ref 98–107)
Creatinine: 1.4 mg/dl — ABNORMAL HIGH (ref 0.6–1.3)
GFR est AA: 49
GFR est non-AA: 40
Glucose: 88 mg/dl (ref 74–106)
Potassium: 3.6 mEq/L (ref 3.5–5.1)
Sodium: 138 mEq/L (ref 136–145)

## 2019-12-28 LAB — EKG 12-LEAD
Atrial Rate: 71 {beats}/min
Diagnosis: NORMAL
P Axis: 39 degrees
P-R Interval: 146 ms
Q-T Interval: 414 ms
QRS Duration: 76 ms
QTc Calculation (Bazett): 449 ms
R Axis: -14 degrees
T Axis: 52 degrees
Ventricular Rate: 71 {beats}/min

## 2019-12-28 LAB — EKG, 12 LEAD, INITIAL
Atrial Rate: 71 {beats}/min
Calculated P Axis: 39 degrees
Calculated R Axis: -14 degrees
Calculated T Axis: 52 degrees
Diagnosis: NORMAL
P-R Interval: 146 ms
Q-T Interval: 414 ms
QRS Duration: 76 ms
QTC Calculation (Bezet): 449 ms
Ventricular Rate: 71 {beats}/min

## 2019-12-31 ENCOUNTER — Inpatient Hospital Stay: Admit: 2019-12-31 | Payer: MEDICARE | Primary: Student in an Organized Health Care Education/Training Program

## 2019-12-31 DIAGNOSIS — Z01812 Encounter for preprocedural laboratory examination: Secondary | ICD-10-CM

## 2019-12-31 LAB — BLOOD TYPE, (ABO+RH)
ABO/Rh(D): O POS
ABO/Rh: O POS

## 2019-12-31 LAB — ANTIBODY SCREEN
Antibody Screen: NEGATIVE
Antibody screen: NEGATIVE

## 2019-12-31 NOTE — Interval H&P Note (Signed)
This nurse received an order for crossmathc for patient, however there was not a number of units on order.   Call placed to Dr. Orpah Cobb office and spoke to [place] to inquire how many units Dr. Idelia Salm wanted fo rpatient.  Olivia Mackie stated that Dr. Idelia Salm was in a room with a patient and that she would ask the doctor and call me back.

## 2020-01-11 NOTE — Interval H&P Note (Signed)
Confirmed with Dr Idelia Salm office no type and cross matched needed/

## 2020-01-11 NOTE — Interval H&P Note (Signed)
PREOPERATIVE INSTRUCTIONS  Please Read Carefully    '[x]'$  Your procedure is scheduled on: 01/12/2020    '[x]'$  The day before your surgery, call the surgeon's office to check on the surgery time and the time you should arrive.     '[x]'$  On the day of your surgery, arrive at the time given to you by your surgeon and check in at the first floor registration desk.    '[x]'$  DO NOT eat or drink anything after midnight before your surgery. This includes gum, mints or hard candy.    '[x]'$  You may brush your teeth the morning of surgery, however DO NOT swallow any water.    '[]'$  No smoking after midnight.  Smoking should be reduced a few days prior to surgery.    '[x]'$  If you are having an outpatient procedure, you must have a responsible adult bring you to the hospital.  They must remain in the surgical waiting area for the duration of your procedure and provide you with transportation home.  You may not drive yourself home.  We also recommend that you arrange for a responsible person to stay with you for 24 hours following your procedure.  Failure to comply with these instructions may result in cancellation of the procedure.    '[]'$  A parent or legal guardian must accompany minors to the hospital.  LEGAL GUARDIANS MUST bring custody papers with them the day of surgery.    '[]'$  If the lab gives you a blue blood ID band, do not throw it away.  Bring it with you on the day of surgery.      '[]'$  Please return to preop surgical testing no more than 14 days before surgery date to have your type and screen done prior to surgery.    '[]'$  If you have been diagnosed with Sleep Apnea and are using a CPAP, BiPAP, and/or Bi-Flex machine, please bring it with you the day of surgery.    '[]'$  Endoscopy patients, please follow the instructions provided by the doctors office.    '[]'$  If crutches are ordered, you must get them and be instructed on proper use before the day of surgery.  Please bring them with you the day of surgery.      PREPARING THE SKIN  BEFORE SURGERY Can Reduce the Risk of Infection  '[]'$  You have been given a Chlorhexidine skin preparation packet with instructions to bathe the night before surgery and the morning of surgery prior to arrival.    '[x]'$  If allergic to Chlorhexidine, use Dial (antibacterial) soap to bathe your skin the night before surgery and the morning of surgery.    '[x]'$  Do not shave your face, underarms, legs or any part of your body at least 48 hours before surgery.  Any clipping needed for surgery will be done at the hospital.      Yvonne Barton  '[x]'$  Wear casual, loose fitting comfortable clothes the day of surgery.    '[x]'$  Remove ALL jewelry, including body piercings.  Leave these and all valuables at home.  Leave suitcases and/or overnight bag at home or in the car for a family member to bring when you get a room.    '[x]'$  DO NOT wear any makeup, nail polish, deodorant, body lotion, aftershave or contact lenses the day of surgery.  Please bring your glasses and glasses case with you the day of surgery.    '[x]'$  Bring any additional paperwork, such as doctors orders, consent form, POA (  power of attorney), and/or advanced directives with you the day of surgery.    '[x]'$  Please notify your surgeon of any changes in your condition such as fever, sore throat or rash as soon as possible before your surgery date.  After office hours, call your surgeon's answering service.    '[x]'$ Bring picture identification and insurance cards with you the day of surgery.      MEDICATIONS:  '[x]'$  Stop taking aspirin and aspirin products/NSAIDs (non-steroidal anti-inflammatory drugs such as Motrin, Aleve, Advil, ibuprofen and BC Powder), vitamins and herbal pills 2 weeks before surgery OR as instructed by your doctor.  However, if you take a daily aspirin, please contact your primary care provider (PCP), for instructions prior to surgery.    '[]'$  Contact your doctor regarding any blood thinner medications you take (such as Coumadin, Plavix, etc.)  for instructions about what to do prior to surgery.    '[]'$ If you have diabetes, hold oral diabetic medications the night before surgery and the morning of surgery.  Hold insulin the morning of surgery unless told otherwise by your doctor.    '[]'$  If you have asthma or use inhalers please bring them the day of surgery.    '[x]'$ Take the following medicine with a sip of water the day of surgery: 01/12/2020    '[x]'$ Take the following home medications:  Medication Documentation Review Audit     Reviewed by Melburn Hake, RN (Registered Nurse) on 01/11/20 at [phone]    Medication Sig Documenting Provider Last Dose Status Taking?   acetaminophen (TYLENOL ARTHRITIS PAIN) 650 mg TbER Take 650 mg by mouth as needed. Provider, Historical  Active    allopurinoL (ZYLOPRIM) 100 mg tablet Take  by mouth daily. am Provider, Historical  Active yes           B.infantis-B.ani-B.long-B.bifi (PROBIOTIC 4X) 10-15 mg TbEC Take  by mouth daily. Phillips probiotic, unsure of dose Provider, Historical  Active    cloNIDine HCL (CATAPRES) 0.1 mg tablet Take  by mouth two (2) times a day. Provider, Historical  Active yes   conjugated estrogens (PREMARIN) 0.625 mg/gram vaginal cream Insert 0.5 g into vagina daily.   Patient not taking: Reported on 01/11/2020    Provider, Historical Not Taking Unknown time Active    ibuprofen (MOTRIN) 200 mg tablet Take  by mouth every six (6) hours as needed for Pain. Provider, Historical  Active    losartan-hydroCHLOROthiazide (HYZAAR) 100-25 mg per tablet Take 1 Tab by mouth daily. Provider, Historical  Active    methocarbamol (ROBAXIN) 500 mg tablet Take 1 Tab by mouth three (3) times daily as needed (muscle spasm).   Patient not taking: Reported on 01/11/2020    Orville Govern, PA Not Taking Unknown time Active No                 *Visitor Policy-One visitor per patient, must be 22 years of age or older, must wear a mask. Upon check in, the patient must arrive with their post procedure transportation home. The  post-procedure transporting party must remain within the surgical waiting room for the duration of the procedure. Failure may result in the cancellation of the procedure*      We want you to have a positive experience at [facility].  If any of these these instructions are not met, it is possible that your surgery will be canceled.  If you have any questions regarding your surgery, please call [facility] at [phone] or your surgeon's office  for further assistance.

## 2020-01-12 ENCOUNTER — Inpatient Hospital Stay: Payer: MEDICARE

## 2020-01-12 MED ORDER — BUPIVACAINE (PF) 0.5 % (5 MG/ML) IJ SOLN
0.5 % (5 mg/mL) | INTRAMUSCULAR | Status: AC
Start: 2020-01-12 — End: ?

## 2020-01-12 MED ORDER — SILVER NITRATE APPLICATORS 75 %-25 % TOPICAL STICK
75-25 % | CUTANEOUS | Status: AC
Start: 2020-01-12 — End: ?

## 2020-01-12 MED ORDER — IPRATROPIUM-ALBUTEROL 2.5 MG-0.5 MG/3 ML NEB SOLUTION
2.5 mg-0.5 mg/3 ml | Freq: Once | RESPIRATORY_TRACT | Status: DC | PRN
Start: 2020-01-12 — End: 2020-01-13

## 2020-01-12 MED ORDER — IBUPROFEN 800 MG TAB
800 mg | ORAL_TABLET | Freq: Three times a day (TID) | ORAL | 0 refills | Status: DC | PRN
Start: 2020-01-12 — End: 2021-04-21

## 2020-01-12 MED ORDER — PROMETHAZINE IN NS 6.25 MG/50 ML IV PIGGY BAG
6.25 mg/50 ml | INTRAVENOUS | Status: DC | PRN
Start: 2020-01-12 — End: 2020-01-13

## 2020-01-12 MED ORDER — NALOXONE 0.4 MG/ML INJECTION
0.4 mg/mL | INTRAMUSCULAR | Status: DC | PRN
Start: 2020-01-12 — End: 2020-01-12

## 2020-01-12 MED ORDER — ONDANSETRON (PF) 4 MG/2 ML INJECTION
4 mg/2 mL | INTRAMUSCULAR | Status: DC | PRN
Start: 2020-01-12 — End: 2020-01-12
  Administered 2020-01-12: 14:00:00 via INTRAVENOUS

## 2020-01-12 MED ORDER — ACETAMINOPHEN 325 MG TABLET
325 mg | ORAL | Status: AC
Start: 2020-01-12 — End: 2020-01-12
  Administered 2020-01-12: 22:00:00 via ORAL

## 2020-01-12 MED ORDER — CELECOXIB 200 MG CAP
200 mg | Freq: Once | ORAL | Status: AC
Start: 2020-01-12 — End: 2020-01-12
  Administered 2020-01-12: 12:00:00 via ORAL

## 2020-01-12 MED ORDER — FLUMAZENIL 0.1 MG/ML IV SOLN
0.1 mg/mL | INTRAVENOUS | Status: DC | PRN
Start: 2020-01-12 — End: 2020-01-13

## 2020-01-12 MED ORDER — IBUPROFEN 400 MG TAB
400 mg | Freq: Once | ORAL | Status: AC | PRN
Start: 2020-01-12 — End: 2020-01-12
  Administered 2020-01-12: 15:00:00 via ORAL

## 2020-01-12 MED ORDER — FENTANYL CITRATE (PF) 50 MCG/ML IJ SOLN
50 mcg/mL | INTRAMUSCULAR | Status: AC
Start: 2020-01-12 — End: ?

## 2020-01-12 MED ORDER — HYDROMORPHONE 1 MG/ML INJECTION SOLUTION
1 mg/mL | INTRAMUSCULAR | Status: DC | PRN
Start: 2020-01-12 — End: 2020-01-13
  Administered 2020-01-12 (×2): via INTRAVENOUS

## 2020-01-12 MED ORDER — DEXAMETHASONE SODIUM PHOSPHATE 10 MG/ML IJ SOLN
10 mg/mL | Freq: Once | INTRAMUSCULAR | Status: AC
Start: 2020-01-12 — End: 2020-01-12
  Administered 2020-01-12: 12:00:00 via INTRAVENOUS

## 2020-01-12 MED ORDER — ONDANSETRON (PF) 4 MG/2 ML INJECTION
4 mg/2 mL | Freq: Once | INTRAMUSCULAR | Status: DC | PRN
Start: 2020-01-12 — End: 2020-01-13

## 2020-01-12 MED ORDER — FLUMAZENIL 0.1 MG/ML IV SOLN
0.1 mg/mL | INTRAVENOUS | Status: DC | PRN
Start: 2020-01-12 — End: 2020-01-12

## 2020-01-12 MED ORDER — SILVER NITRATE APPLICATORS 75 %-25 % TOPICAL STICK
75-25 % | CUTANEOUS | Status: DC | PRN
Start: 2020-01-12 — End: 2020-01-12
  Administered 2020-01-12: 13:00:00 via TOPICAL

## 2020-01-12 MED ORDER — ACETAMINOPHEN 1,000 MG/100 ML (10 MG/ML) IV
1000 mg/100 mL (10 mg/mL) | Freq: Once | INTRAVENOUS | Status: DC | PRN
Start: 2020-01-12 — End: 2020-01-13

## 2020-01-12 MED ORDER — PROPOFOL 10 MG/ML IV EMUL
10 mg/mL | INTRAVENOUS | Status: DC | PRN
Start: 2020-01-12 — End: 2020-01-12
  Administered 2020-01-12 (×2): via INTRAVENOUS

## 2020-01-12 MED ORDER — DIPHENHYDRAMINE HCL 50 MG/ML IJ SOLN
50 mg/mL | Freq: Once | INTRAMUSCULAR | Status: DC | PRN
Start: 2020-01-12 — End: 2020-01-13

## 2020-01-12 MED ORDER — ACETAMINOPHEN 500 MG TAB
500 mg | Freq: Once | ORAL | Status: AC
Start: 2020-01-12 — End: 2020-01-12
  Administered 2020-01-12: 12:00:00 via ORAL

## 2020-01-12 MED ORDER — LACTATED RINGERS IV
INTRAVENOUS | Status: DC
Start: 2020-01-12 — End: 2020-01-12

## 2020-01-12 MED ORDER — MEPERIDINE (PF) 25 MG/ML INJ SOLUTION
25 mg/ml | INTRAMUSCULAR | Status: DC | PRN
Start: 2020-01-12 — End: 2020-01-13

## 2020-01-12 MED ORDER — FENTANYL CITRATE (PF) 50 MCG/ML IJ SOLN
50 mcg/mL | INTRAMUSCULAR | Status: DC | PRN
Start: 2020-01-12 — End: 2020-01-12
  Administered 2020-01-12 (×2): via INTRAVENOUS

## 2020-01-12 MED ORDER — GLYCOPYRROLATE 0.2 MG/ML IJ SOLN
0.2 mg/mL | Freq: Once | INTRAMUSCULAR | Status: AC
Start: 2020-01-12 — End: 2020-01-12
  Administered 2020-01-12: 12:00:00 via INTRAVENOUS

## 2020-01-12 MED ORDER — LABETALOL 5 MG/ML IV SOLN
5 mg/mL | INTRAVENOUS | Status: DC | PRN
Start: 2020-01-12 — End: 2020-01-13

## 2020-01-12 MED ORDER — LIDOCAINE (PF) 20 MG/ML (2 %) IJ SOLN
20 mg/mL (2 %) | INTRAMUSCULAR | Status: DC | PRN
Start: 2020-01-12 — End: 2020-01-12
  Administered 2020-01-12: 13:00:00 via INTRAVENOUS

## 2020-01-12 MED ORDER — ONDANSETRON (PF) 4 MG/2 ML INJECTION
4 mg/2 mL | Freq: Four times a day (QID) | INTRAMUSCULAR | Status: DC | PRN
Start: 2020-01-12 — End: 2020-01-13

## 2020-01-12 MED ORDER — WHITE PETROLATUM-MINERAL OIL 56.8 %-42.5 % EYE OINTMENT
OPHTHALMIC | Status: DC | PRN
Start: 2020-01-12 — End: 2020-01-13

## 2020-01-12 MED ORDER — LACTATED RINGERS IV
INTRAVENOUS | Status: DC
Start: 2020-01-12 — End: 2020-01-12
  Administered 2020-01-12: 12:00:00 via INTRAVENOUS

## 2020-01-12 MED ORDER — NALOXONE 0.4 MG/ML INJECTION
0.4 mg/mL | INTRAMUSCULAR | Status: DC | PRN
Start: 2020-01-12 — End: 2020-01-13

## 2020-01-12 MED FILL — CELECOXIB 200 MG CAP: 200 mg | ORAL | Qty: 2

## 2020-01-12 MED FILL — ACETAMINOPHEN 500 MG TAB: 500 mg | ORAL | Qty: 2

## 2020-01-12 MED FILL — DEXAMETHASONE SODIUM PHOSPHATE 10 MG/ML IJ SOLN: 10 mg/mL | INTRAMUSCULAR | Qty: 1

## 2020-01-12 MED FILL — TYLENOL 325 MG TABLET: 325 mg | ORAL | Qty: 2

## 2020-01-12 MED FILL — IBUPROFEN 400 MG TAB: 400 mg | ORAL | Qty: 2

## 2020-01-12 MED FILL — LACTATED RINGERS IV: INTRAVENOUS | Qty: 1000

## 2020-01-12 MED FILL — BUPIVACAINE (PF) 0.5 % (5 MG/ML) IJ SOLN: 0.5 % (5 mg/mL) | INTRAMUSCULAR | Qty: 30

## 2020-01-12 MED FILL — FENTANYL CITRATE (PF) 50 MCG/ML IJ SOLN: 50 mcg/mL | INTRAMUSCULAR | Qty: 2

## 2020-01-12 MED FILL — SILVER NITRATE APPLICATORS 75 %-25 % TOPICAL STICK: 75-25 % | CUTANEOUS | Qty: 4

## 2020-01-12 MED FILL — HYDROMORPHONE (PF) 1 MG/ML IJ SOLN: 1 mg/mL | INTRAMUSCULAR | Qty: 1

## 2020-01-12 MED FILL — GLYCOPYRROLATE 0.2 MG/ML IJ SOLN: 0.2 mg/mL | INTRAMUSCULAR | Qty: 1

## 2020-01-12 NOTE — Op Note (Signed)
[facility]  Operation Report  NAME:  Yvonne Barton, Yvonne Barton  SEX:   F  DATE: 01/12/2020  DOB: [birth year]  MR#    [id number]  ROOM:  PL  ACCT#  [id number]    cc: Clyda Hurdle MD      PREOPERATIVE DIAGNOSES:    1.  Postmenopausal bleeding.  2.  Thickened endometrium on ultrasound.     POSTOPERATIVE DIAGNOSES:    Same.     PROCEDURE:    Hysteroscopy, dilatation and curettage.     SURGEON:    Dr. Idelia Salm.     ANESTHESIA:    General.     ESTIMATED BLOOD LOSS:    Minimal.     COMPLICATIONS:    None.     DESCRIPTION OF PROCEDURE:    The patient was taken to the operating room, placed in the supine position and then given a laryngeal mask airway for anesthesia.  The patient was placed in the dorsal lithotomy position, prepped and sterilely draped.  The bladder was emptied with a latex-free catheter.  A weighted speculum was placed posteriorly and a right-angle retractor anteriorly.  A single-tooth tenaculum was used to grasp the cervix.  The cervix was dilated with Hanks dilators.  Hysteroscopy was performed using normal saline as the distending medium.  Upon entry into the endometrial cavity, there was a moderate amount of endometrial tissue seen.  Both tubal ostia were visualized.  There were no polyps or fibroids seen.  The endocervical canal was normal appearing.  A D&C was performed.  A sharp curette was placed into the endometrial cavity which was curettaged with a small amount of endometrial tissue obtained.  The instruments were removed from the vagina.  There was oozing noted at the cervix from the tenaculum site which was controlled with silver nitrate.  The patient tolerated the procedure well and there were no complications.  All sponge counts were correct at the end of the procedure. The fluid intake and outtake were appropriate at the end of the case.  The patient was transferred to recovery in stable condition.      ___________________  Clyda Hurdle MD   Dictated [id number] Sitlaly Gudiel, MD  SA  D:  01/12/2020 16:31:55  T: 01/12/2020 17:01:07  [id number]

## 2020-01-12 NOTE — Anesthesia Pre-Procedure Evaluation (Signed)
Relevant Problems   No relevant active problems       Anesthetic History   No history of anesthetic complications            Review of Systems / Medical History  Patient summary reviewed, nursing notes reviewed and pertinent labs reviewed    Pulmonary  Within defined limits        Undiagnosed apnea         Neuro/Psych   Within defined limits           Cardiovascular    Hypertension: well controlled              Exercise tolerance: >4 METS     GI/Hepatic/Renal  Within defined limits              Endo/Other    Diabetes (Pre-diabetes, diet controlled): type 2    Morbid obesity    Comments: Gout Other Findings            Physical Exam    Airway  Mallampati: II  TM Distance: 4 - 6 cm  Neck ROM: normal range of motion   Mouth opening: Normal     Cardiovascular  Regular rate and rhythm,  S1 and S2 normal,  no murmur, click, rub, or gallop             Dental  No notable dental hx       Pulmonary  Breath sounds clear to auscultation               Abdominal  GI exam deferred       Other Findings            Anesthetic Plan    ASA: 3  Anesthesia type: general          Induction: Intravenous  Anesthetic plan and risks discussed with: Patient

## 2020-01-12 NOTE — Progress Notes (Signed)
01/12/20 1100   Family Communication   Family Update Message Other (Comment)  (Attempted to contact patients husband, Marland Kitchen. No answer on phone. Not in waiting room. Asked reception to notifiy phase 2 when he gets here)   Contact Person Relationship to Patient Spouse   Family/Significant Other Update Called

## 2020-01-12 NOTE — Interval H&P Note (Signed)
Multiple attempts to contact husband. He is not responding to phone calls.  Patient using her personal cell phone and he is not responding to her calls either.  Patient will call her friend.

## 2020-01-12 NOTE — Progress Notes (Signed)
01/12/20 2100   Discharge Checklist   Ride and Caregiver Arranged Yes   Ride Caregiver Provider: Estrella Deeds husband   Post-op discharge instructions given and explained to responsible party Yes    Responsible party will drive the patient home Yes    Departure Mode With family   Discharged with Documented Belongings Yes   Mobility at [facility];Wheelchair

## 2020-01-12 NOTE — Interval H&P Note (Signed)
Pt continues to wait for husband and is unable to get in touch with him for ride home and to have someone at home with her after surgery and has no other ride or anyone else that can be with her at home until late tonight.  Pt currently sitting on edge of stretcher eating dinner tray. Complains of no pain. Has voided twice now.

## 2020-01-12 NOTE — Anesthesia Post-Procedure Evaluation (Signed)
Procedure(s):  HYSTEROSCOPY; DILATION & CURETTAGE.    general    Anesthesia Post Evaluation      Multimodal analgesia: multimodal analgesia used between 6 hours prior to anesthesia start to PACU discharge  Patient location during evaluation: bedside  Patient participation: complete - patient participated  Level of consciousness: responsive to verbal stimuli  Pain management: satisfactory to patient  Airway patency: patent  Anesthetic complications: no  Cardiovascular status: acceptable  Respiratory status: acceptable  Hydration status: acceptable  Post anesthesia nausea and vomiting:  none      INITIAL Post-op Vital signs:   Vitals Value Taken Time   BP 136/78 01/12/20 1056   Temp 37.5 ??C (99.5 ??F) 01/12/20 0958   Pulse 70 01/12/20 1059   Resp 17 01/12/20 1059   SpO2 91 % 01/12/20 1059

## 2020-01-12 NOTE — Op Note (Signed)
Brief Postoperative Note    Patient: Yvonne Barton  Date of Birth: 06-Feb-1956  MRN: [id number]    Date of Procedure: 01/12/2020     Pre-Op Diagnosis: (N95.0) POSTMENOPAUSAL BLEEDING; (R93.89) THICKENED ENDOMETRIUM ON [place]    Post-Op Diagnosis: SAME    Procedure(s):  HYSTEROSCOPY; DILATION & CURETTAGE    Surgeon(s):  Clyda Hurdle, MD    Surgical Assistant: Surg Asst-1: Ardis Hughs A    Anesthesia: General     Estimated Blood Loss (mL): Minimal    Complications: None    Specimens:   ID Type Source Tests Collected by Time Destination   1 : Endometrial Curetting Curetting Endometrial AP HISTOLOGY Clyda Hurdle, MD 01/12/2020 0941         Implants: * No implants in log *    Drains: * No LDAs found *      Electronically Signed by Clyda Hurdle, MD on 01/12/2020 at 9:59 AM

## 2020-04-21 ENCOUNTER — Encounter (INDEPENDENT_AMBULATORY_CARE_PROVIDER_SITE_OTHER): Payer: Self-pay

## 2020-05-12 ENCOUNTER — Encounter (INDEPENDENT_AMBULATORY_CARE_PROVIDER_SITE_OTHER): Payer: Self-pay

## 2020-06-30 ENCOUNTER — Ambulatory Visit: Attending: Nurse Practitioner | Primary: Student in an Organized Health Care Education/Training Program

## 2020-06-30 ENCOUNTER — Ambulatory Visit
Admit: 2020-06-30 | Discharge: 2020-06-30 | Attending: Nurse Practitioner | Primary: Student in an Organized Health Care Education/Training Program

## 2020-06-30 DIAGNOSIS — N200 Calculus of kidney: Secondary | ICD-10-CM

## 2020-06-30 LAB — AMB POC URINALYSIS DIP STICK AUTO W/O MICRO
Bilirubin (UA POC): NEGATIVE
Bilirubin, Urine, POC: NEGATIVE
Glucose (UA POC): NEGATIVE
Glucose, Urine, POC: NEGATIVE
Ketones (UA POC): NEGATIVE
Ketones, Urine, POC: NEGATIVE
Leukocyte Esterase, Urine, POC: NEGATIVE
Leukocyte esterase (UA POC): NEGATIVE
Nitrite, Urine, POC: NEGATIVE
Nitrites (UA POC): NEGATIVE
Protein (UA POC): NEGATIVE
Protein, Urine, POC: NEGATIVE
Specific Gravity, Urine, POC: 1.025 NA (ref 1.001–1.035)
Specific gravity (UA POC): 1.025 (ref 1.001–1.035)
Urobilinogen (UA POC): 0.2 (ref 0.2–1)
Urobilinogen, POC: 0.2 (ref 0.2–1)
pH (UA POC): 5.5 (ref 4.6–8.0)
pH, Urine, POC: 5.5 NA (ref 4.6–8.0)

## 2020-06-30 NOTE — Progress Notes (Signed)
Yvonne Barton has order for [place]      To be done at [facility]    Needed by: NOW    Patient has a follow-up appointment:  Yes    12/28/20  If MRI, does patient have a pacemaker:   No    Order has been placed in connect care:  Yes    Is this a STAT order:  No      Yvonne Barton

## 2020-06-30 NOTE — Progress Notes (Signed)
Sch 07/12/2020 1:00 PM US KIDNEYS RETROPERITONEAL 30 min [facility] Ultrasound [facility] [place] RM 3

## 2020-06-30 NOTE — Progress Notes (Signed)
Progress Notes by Marlyn Corporal, NP at 06/30/20 0920                Author: Marlyn Corporal, NP  Service: --  Author Type: Nurse Practitioner       Filed: 06/30/20 0948  Encounter Date: 06/30/2020  Status: Signed          Editor: Marlyn Corporal, NP (Nurse Practitioner)                                       ASSESSMENT:       1. Kidney Stone     Prior Surgery: None    Stone Analysis: None    Most Recent Imaging: CT abd/pelvis wo 04/17/20- no stones visualized      Medical Therapy: None    Metabolic Workup: 24 hour urine- low urine output: hypocitruria       2. Hx of UTIs    No urine cx on file    Taking probiotic                      PLAN:     ??  Reviewed CT scan. No evidence of kidney stones    ??  Continue stone diet. Increase fluids. Reviewed she needs to void 2-2.5 L a day. Discussed increasing citric in her diet.    ??  Recheck Litholink in 6 weeks after making dietary changes.    ??  RUS in 6 months    ??  RTC to discuss                   Chief Complaint       Patient presents with        ?  Kidney Stone     ?  Recurrent UTI             hx           HISTORY OF PRESENT ILLNESS:  Yvonne Barton  is a 65 y.o. female who presents  today for consultation and has been referred by NP Landry Mellow for kidney stone. The patient reports hx of frequent UTIs.  She underwent a RUS on 03/09/2019 that revealed a 55mm non-obstructing right superior pole stone.       Interval Hx   The patient reports she was admitted to [facility] general on 04/18/2020 for sepsis r/t UTI. The patient reports she had right flank pain and presented to the ER. She was dx with a UTI and sent home. She was noted to have positive BC and called to  return. She underwent a CT scan that was negative for kidney stones. She was admitted and treated with IV antibiotics.       The patient reports she has not had a UTI since her admission. She reports she is no longer using tap water for bathing.    She also was told she could have passed an  infected stone prior to CT scan.           Denies f,c,n,v   Denies gross hematuria    Denies dysuria    Denies flank pain    Reports intermittent frequency and urgency    Nocturia x1   Denies f,c,n,v      Married 37 years.          Hx of gout.  IMAGING and LABS:      CT abd/pelvis wo 04/17/2020   IMPRESSION   1. No evidence of obstructive nephropathy or renal stone.   2. ??Cystic structure just anterior to the uterus is minimally changed when compared to 2017 study and likely a benign  simple cyst.   3. Stable right hepatic cyst.   4. Stable left renal cyst.    Findings:    Adrenal glands: Negative.     Kidneys: 1 cm hyperattenuating focus adjacent to the pelvis of left kidney (series 2 axial 31) that was present on 2017 study and likely a proteinaceous or hemorrhagic cyst given stability. No hydronephrosis. No renal  calculi.             RUS 03/09/2019      IMPRESSION:  1. 3 mm nonobstructing right renal calculus, superior pole.   2. Simple right midpole cyst.   3. Left kidney and bladder are normal in appearance.    FINDINGS:   Right Kidney: 9.0 x 4.1 x 4.3 cm in size.   Echogenicity: Normal   Parenchyma: Normal thickness and contour   No collecting system dilation or hydronephrosis.   Masses: No solid mass   Cysts: ??Simple midpole cyst  measuring 0.8 x 0.7 x 0.9 cm. ??   Calculi: There is a small 3 mm nonobstructing superior pole calculus.     Left Kidney: 9.2 x 4.8 x 4.0 cm in size.   Echogenicity: Normal   Parenchyma: Normal thickness and contour   No collecting  system dilation or hydronephrosis.   Masses: No solid mass   Cysts: ??No simple or complex cysts ??   Calculi: None present     Urinary Bladder: No wall thickening or intraluminal debris. Bilateral ureteral jets are present. Post  void residual 5 ml.        CT abd/pelvis 05/09/2018   IMPRESSION:   1.  Lobulated renal contours bilaterally could reflect some scarring.   -No hydronephrosis or evidence of obstruction.   -2 mm nonobstructing right renal  calcification.   -Subcentimeter possible medial left cortical cyst.   ??   2. Hepatic steatosis. Splenic granuloma.   ??   3. 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 No gastric or bowel   distention.   ??   4. Descending and sigmoid diverticulosis without acute diverticulitis.   ??   5. 4.5 cm right ovarian/adnexal cyst as above. No pelvic fluid or regional   inflammation.   -Would consider nonemergent ultrasound for further characterization given   patient's presumed postmenopausal status. Follow-up with ultrasound currently if   there is acute clinical concern or corresponding symptoms.  ??            Past Medical History:        Diagnosis  Date         ?  Anemia            many years ago         ?  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  Gout       ?  Hypercholesteremia       ?  Hypertension       ?  Kidney cysts       ?  Kidney stones       ?  Obesity       ?  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  HX COLONOSCOPY         ?  HX DILATION AND CURETTAGE         ?  HX ENDOSCOPY              "several times"          ?  HX TONSILLECTOMY    age 61          ?  PR LAP GASTRIC BYPASS/ROUX-EN-Y                 Social History          Tobacco Use         ?  Smoking status:  Former Smoker     ?  Smokeless tobacco:  Never Used        ?  Tobacco comment: 30-40 years       Substance Use Topics         ?  Alcohol use:  Yes             Comment: wine during the holidays         ?  Drug use:  Never             Allergies        Allergen  Reactions         ?  Percocet [Oxycodone-Acetaminophen]  Anaphylaxis         ?  Lactose  Diarrhea             Family History         Problem  Relation  Age of Onset          ?  Cancer  Mother       ?  Dementia  Mother            ?  Stroke  Father               Current Outpatient Medications          Medication  Sig  Dispense  Refill           ?   cholecalciferol, vitamin D3, 50 mcg (2,000 unit) tab  Take 1 Tablet by mouth daily.         ?  hydroCHLOROthiazide (HYDRODIURIL) 25 mg tablet  Take 25 mg by mouth daily.         ?  losartan (COZAAR) 100 mg tablet  Take 100 mg by mouth daily.         ?  sodium bicarbonate 650 mg tablet  Take 650 mg by mouth two (2) times a day.         ?  ciprofloxacin HCl (CIPRO) 500 mg tablet           ?  megestroL (MEGACE) 40 mg tablet  Take 160 mg by mouth daily.         ?  spironolactone (ALDACTONE) 25 mg tablet  1 tablet         ?  traMADoL (ULTRAM) 50 mg tablet  1 tablet as needed         ?  ibuprofen (MOTRIN) 800 mg tablet  Take 1 Tablet by mouth every eight (8) hours as needed for Pain. Indications: pain  30 Tablet  0           ?  losartan-hydroCHLOROthiazide (HYZAAR) 100-25 mg per tablet  Take 1 Tab by mouth daily.               ?  allopurinoL (ZYLOPRIM) 100 mg tablet  Take  by mouth daily. am         ?  cloNIDine HCL (CATAPRES) 0.1 mg tablet  Take  by mouth two (2) times a day.         ?  acetaminophen (TYLENOL ARTHRITIS PAIN) 650 mg TbER  Take 650 mg by mouth as needed.         ?  B.infantis-B.ani-B.long-B.bifi (PROBIOTIC 4X) 10-15 mg TbEC  Take  by mouth daily. Phillips probiotic, unsure of dose               ?  methocarbamol (ROBAXIN) 500 mg tablet  Take 1 Tab by mouth three (3) times daily as needed (muscle spasm). (Patient not taking: Reported on 01/11/2020)  20 Tab  0                 Review of Systems   Constitutional: Fever: No   Skin: Rash: No   HEENT: Hearing difficulty: No   Eyes: Blurred vision: No   Cardiovascular: Chest pain: No   Respiratory: Shortness of breath: No   Gastrointestinal: Nausea/vomiting: No   Musculoskeletal: Back pain: No   Neurological: Weakness: No   Psychological: Memory loss: No   Comments/additional findings:                PHYSICAL EXAMINATION:       Visit Vitals      Ht  5\' 3"  (1.6 m)     Wt  231 lb (104.8 kg)        BMI  40.92 kg/m??        Constitutional: Well developed, well-nourished  female in no acute distress.    CV:  No peripheral swelling noted   Respiratory: No respiratory distress or difficulties   Abdomen:  Soft and nontender. No masses. No hepatosplenomegaly.    GU Female:  No CVA tenderness.       Skin:  Normal color. No evidence of jaundice.      Neuro/Psych:  Patient with appropriate affect.  Alert and oriented.     Lymphatic:   No enlargement of supraclavicular lymph nodes.         REVIEW OF LABS AND IMAGING:          Results for orders placed or performed during the hospital encounter of 12/31/19     BLOOD TYPE, (ABO+RH)         Result  Value  Ref Range            ABO/Rh(D)  O Rh Positive          ANTIBODY SCREEN         Result  Value  Ref Range            Antibody screen  NEG                 A copy of today's office visit with all pertinent imaging results and labs were sent to the referring physician, NP Aundra Millet, NP-C   [facility]  [place], [place] [address]    Phone: [phone]     Fax: [phone]      [address] [place], [address]   [place], [place] [address]   P: [phone]    F: [phone]                    ICD-10-CM  ICD-9-CM             1.  Stone in kidney   N20.0  592.0  AMB POC URINALYSIS DIP STICK AUTO W/O MICRO

## 2020-06-30 NOTE — Progress Notes (Signed)
Faxed Imaging to [place], [place]; [place]; NOW

## 2020-06-30 NOTE — Progress Notes (Signed)
Called patient and he agreed to be transferred to [place]

## 2020-07-17 ENCOUNTER — Encounter

## 2020-07-20 ENCOUNTER — Encounter

## 2020-07-20 NOTE — Progress Notes (Signed)
Reviewed ultrasound.   Will need to follow-up with gynecology or Dr Pauline Aus for further evaluation of the adnexal cyst.   Kidneys are stable

## 2020-07-27 ENCOUNTER — Encounter

## 2020-08-02 ENCOUNTER — Encounter

## 2020-08-02 ENCOUNTER — Inpatient Hospital Stay
Admit: 2020-08-02 | Payer: MEDICARE | Attending: Obstetrics & Gynecology | Primary: Student in an Organized Health Care Education/Training Program

## 2020-08-02 DIAGNOSIS — N83201 Unspecified ovarian cyst, right side: Secondary | ICD-10-CM

## 2020-08-18 ENCOUNTER — Inpatient Hospital Stay: Payer: MEDICARE | Attending: Obstetrics & Gynecology | Primary: Family Medicine

## 2020-08-23 ENCOUNTER — Emergency Department: Admit: 2020-08-23 | Payer: MEDICARE | Primary: Family Medicine

## 2020-08-23 ENCOUNTER — Inpatient Hospital Stay
Admit: 2020-08-23 | Discharge: 2020-08-24 | Disposition: A | Discharge status: Home / Self Care | Payer: MEDICARE | Attending: Emergency Medicine

## 2020-08-23 DIAGNOSIS — R079 Chest pain, unspecified: Secondary | ICD-10-CM

## 2020-08-23 LAB — COMPREHENSIVE METABOLIC PANEL
ALT: 21 U/L (ref 13–56)
AST: 17 U/L (ref 10–38)
Albumin/Globulin Ratio: 0.7 — ABNORMAL LOW (ref 0.8–1.7)
Albumin: 3.5 g/dL (ref 3.4–5.0)
Alkaline Phosphatase: 128 U/L — ABNORMAL HIGH (ref 45–117)
Anion Gap: 6 mmol/L (ref 3.0–18)
BUN: 23 MG/DL — ABNORMAL HIGH (ref 7.0–18)
Bun/Cre Ratio: 15 (ref 12–20)
CO2: 28 mmol/L (ref 21–32)
Calcium: 9.5 MG/DL (ref 8.5–10.1)
Chloride: 104 mmol/L (ref 100–111)
Creatinine: 1.55 MG/DL — ABNORMAL HIGH (ref 0.6–1.3)
EGFR IF NonAfrican American: 34 mL/min/{1.73_m2} — ABNORMAL LOW (ref >60)
GFR African American: 41 mL/min/{1.73_m2} — ABNORMAL LOW (ref >60)
Globulin: 5 g/dL — ABNORMAL HIGH (ref 2.0–4.0)
Glucose: 114 mg/dL — ABNORMAL HIGH (ref 74–99)
Potassium: 3.4 mmol/L — ABNORMAL LOW (ref 3.5–5.5)
Sodium: 138 mmol/L (ref 136–145)
Total Bilirubin: 0.4 MG/DL (ref 0.2–1.0)
Total Protein: 8.5 g/dL — ABNORMAL HIGH (ref 6.4–8.2)

## 2020-08-23 LAB — CBC WITH AUTO DIFFERENTIAL
Basophils %: 0 % (ref 0–2)
Basophils Absolute: 0 10*3/uL (ref 0.0–0.1)
Eosinophils %: 2 % (ref 0–5)
Eosinophils Absolute: 0.1 10*3/uL (ref 0.0–0.4)
Granulocyte Absolute Count: 0 10*3/uL (ref 0.00–0.04)
Hematocrit: 43.1 % (ref 35.0–45.0)
Hemoglobin: 13.3 g/dL (ref 12.0–16.0)
Immature Granulocytes: 0 % (ref 0.0–0.5)
Lymphocytes %: 34 % (ref 21–52)
Lymphocytes Absolute: 2.8 10*3/uL (ref 0.9–3.6)
MCH: 24 PG (ref 24.0–34.0)
MCHC: 30.9 g/dL — ABNORMAL LOW (ref 31.0–37.0)
MCV: 77.8 FL — ABNORMAL LOW (ref 78.0–100.0)
MPV: 13.1 FL — ABNORMAL HIGH (ref 9.2–11.8)
Monocytes %: 8 % (ref 3–10)
Monocytes Absolute: 0.6 10*3/uL (ref 0.05–1.2)
NRBC Absolute: 0 10*3/uL (ref 0.00–0.01)
Neutrophils %: 56 % (ref 40–73)
Neutrophils Absolute: 4.7 10*3/uL (ref 1.8–8.0)
Nucleated RBCs: 0 PER 100 WBC
Platelets: 267 10*3/uL (ref 135–420)
RBC: 5.54 M/uL — ABNORMAL HIGH (ref 4.20–5.30)
RDW: 15.1 % — ABNORMAL HIGH (ref 11.6–14.5)
WBC: 8.3 10*3/uL (ref 4.6–13.2)

## 2020-08-23 LAB — TROPONIN, HIGH SENSITIVITY: Troponin, High Sensitivity: 7 ng/L (ref 0–54)

## 2020-08-23 LAB — PROBNP, N-TERMINAL: BNP: 26 PG/ML (ref 0–900)

## 2020-08-23 LAB — MAGNESIUM
Magnesium: 2.5 mg/dL (ref 1.6–2.6)
Magnesium: 2.5 mg/dL (ref 1.6–2.6)

## 2020-08-23 LAB — TSH 3RD GENERATION
TSH: 1.29 u[IU]/mL (ref 0.36–3.74)
TSH: 1.29 u[IU]/mL (ref 0.36–3.74)

## 2020-08-23 LAB — CBC WITH AUTOMATED DIFF
ABS. BASOPHILS: 0 10*3/uL (ref 0.0–0.1)
ABS. EOSINOPHILS: 0.1 10*3/uL (ref 0.0–0.4)
ABS. IMM. GRANS.: 0 10*3/uL (ref 0.00–0.04)
ABS. LYMPHOCYTES: 2.8 10*3/uL (ref 0.9–3.6)
ABS. MONOCYTES: 0.6 10*3/uL (ref 0.05–1.2)
ABS. NEUTROPHILS: 4.7 10*3/uL (ref 1.8–8.0)
ABSOLUTE NRBC: 0 10*3/uL (ref 0.00–0.01)
BASOPHILS: 0 % (ref 0–2)
EOSINOPHILS: 2 % (ref 0–5)
HCT: 43.1 % (ref 35.0–45.0)
HGB: 13.3 g/dL (ref 12.0–16.0)
IMMATURE GRANULOCYTES: 0 % (ref 0.0–0.5)
LYMPHOCYTES: 34 % (ref 21–52)
MCH: 24 PG (ref 24.0–34.0)
MCHC: 30.9 g/dL — ABNORMAL LOW (ref 31.0–37.0)
MCV: 77.8 FL — ABNORMAL LOW (ref 78.0–100.0)
MONOCYTES: 8 % (ref 3–10)
MPV: 13.1 FL — ABNORMAL HIGH (ref 9.2–11.8)
NEUTROPHILS: 56 % (ref 40–73)
NRBC: 0 PER 100 WBC
PLATELET: 267 10*3/uL (ref 135–420)
RBC: 5.54 M/uL — ABNORMAL HIGH (ref 4.20–5.30)
RDW: 15.1 % — ABNORMAL HIGH (ref 11.6–14.5)
WBC: 8.3 10*3/uL (ref 4.6–13.2)

## 2020-08-23 LAB — METABOLIC PANEL, COMPREHENSIVE
A-G Ratio: 0.7 — ABNORMAL LOW (ref 0.8–1.7)
ALT (SGPT): 21 U/L (ref 13–56)
AST (SGOT): 17 U/L (ref 10–38)
Albumin: 3.5 g/dL (ref 3.4–5.0)
Alk. phosphatase: 128 U/L — ABNORMAL HIGH (ref 45–117)
Anion gap: 6 mmol/L (ref 3.0–18)
BUN/Creatinine ratio: 15 (ref 12–20)
BUN: 23 MG/DL — ABNORMAL HIGH (ref 7.0–18)
Bilirubin, total: 0.4 MG/DL (ref 0.2–1.0)
CO2: 28 mmol/L (ref 21–32)
Calcium: 9.5 MG/DL (ref 8.5–10.1)
Chloride: 104 mmol/L (ref 100–111)
Creatinine: 1.55 MG/DL — ABNORMAL HIGH (ref 0.6–1.3)
GFR est AA: 41 mL/min/{1.73_m2} — ABNORMAL LOW (ref >60)
GFR est non-AA: 34 mL/min/{1.73_m2} — ABNORMAL LOW (ref >60)
Globulin: 5 g/dL — ABNORMAL HIGH (ref 2.0–4.0)
Glucose: 114 mg/dL — ABNORMAL HIGH (ref 74–99)
Potassium: 3.4 mmol/L — ABNORMAL LOW (ref 3.5–5.5)
Protein, total: 8.5 g/dL — ABNORMAL HIGH (ref 6.4–8.2)
Sodium: 138 mmol/L (ref 136–145)

## 2020-08-23 LAB — NT-PRO BNP: NT pro-BNP: 26 PG/ML (ref 0–900)

## 2020-08-23 LAB — TROPONIN-HIGH SENSITIVITY: Troponin-High Sensitivity: 7 ng/L (ref 0–54)

## 2020-08-23 MED ORDER — LACTATED RINGERS BOLUS IV
Freq: Once | INTRAVENOUS | Status: AC
Start: 2020-08-23 — End: 2020-08-23
  Administered 2020-08-24: via INTRAVENOUS

## 2020-08-23 MED FILL — LACTATED RINGERS IV: INTRAVENOUS | Qty: 1000

## 2020-08-23 NOTE — ED Notes (Signed)
 Pt arrived via wheelchair with nursing sup as a rapid response in the lobby.  Pt reports chest pain times 1 week and states she saw her PCP today and was told there was some EKG changes.

## 2020-08-23 NOTE — ED Notes (Signed)
Pt provided verbal and written discharge instructions. IV DC'd. Pt ambulatory from department wit belongings.

## 2020-08-23 NOTE — ED Provider Notes (Signed)
ED Provider Notes by Wayland Salinas, MD at 08/23/20 [place]                Author: Wayland Salinas, MD  Service: Emergency Medicine  Author Type: Resident       Filed: 08/23/20 2228  Date of Service: 08/23/20 1550  Status: Attested           Editor: Wayland Salinas, MD (Resident)  Cosigner: Retta Diones, DO at 08/24/20 1307          Attestation signed by Retta Diones, DO at 08/24/20 1307          I personally saw and examined the patient. I have reviewed and agree with the residents findings, including all diagnostic interpretations, and plans  as written. I was present during the key portions of separately billed procedures.      MDM:   65 year old female presenting with intermittent chest pain which she describes as a "thump to her chest lasting less than a few seconds.  She has been experiencing this for the past 10 days.  Overall stable vital signs and stable physical exam.  Her blood  work was largely unremarkable.  EKG did demonstrate some new T wave inversions in 1 and aVL which were not present on her initial EKG on ED arrival.  However patient was asymptomatic this time.  Case discussed with cardiology recommending outpatient evaluation  and stress testing.  She will be started on metoprolol here.  Patient was given instructions to follow-up with cardiology and ED return precautions.  She verbalized good understanding agreement with plan.  Discharged in stable condition without chest  pain.      Retta Diones, DO                                    EMERGENCY DEPARTMENT HISTORY AND PHYSICAL EXAM         Date: 08/23/2020   Patient Name: Yvonne Barton        History of Presenting Illness          Chief Complaint       Patient presents with        ?  Chest Pain           History Provided By: Patient      HPI: Yvonne Barton,  65 y.o. female presents to the ED with cc of chest pain. She states that for the past 10  days or the pain occurs like a "thump" or a punch" usually a few times in  her central chest when she stands up or if walks. It occurs about 10 times per day. She has associated dizziness/lightheadedness feeling and a cold sweat. She also reports mild  shortness of breath suring this episode. She immediately will sit down and rest and her symptoms improve. No syncope. She states that the pain only occurs with the "thump" and lasts a second at a time with the "thump." Pain is 6/10 in severity and occasionally  has pain in her left arm. She reports remote history of tobacco use 30 years ago. No medication changes recently. She drinks caffeine/coffee every other day but no changes or increases. No alcohol. No drugs. No thyroid disease.       She reports a history of "palpitations" which she described as a fluttering sensation in her chest. She had a monitor on for that in the past. She has  not had symptoms in years.          PCP: [name], MD        No current facility-administered medications on file prior to encounter.          Current Outpatient Medications on File Prior to Encounter          Medication  Sig  Dispense  Refill           ?  cholecalciferol, vitamin D3, 50 mcg (2,000 unit) tab  Take 1 Tablet by mouth daily.         ?  ciprofloxacin HCl (CIPRO) 500 mg tablet           ?  hydroCHLOROthiazide (HYDRODIURIL) 25 mg tablet  Take 25 mg by mouth daily. (Patient not taking: Reported on 06/30/2020)         ?  losartan (COZAAR) 100 mg tablet  Take 100 mg by mouth daily. (Patient not taking: Reported on 06/30/2020)         ?  megestroL (MEGACE) 40 mg tablet  Take 160 mg by mouth daily. (Patient not taking: Reported on 06/30/2020)         ?  sodium bicarbonate 650 mg tablet  Take 650 mg by mouth two (2) times a day.         ?  spironolactone (ALDACTONE) 25 mg tablet  1 tablet (Patient not taking: Reported on 06/30/2020)         ?  traMADoL (ULTRAM) 50 mg tablet  1 tablet as needed (Patient not taking: Reported on 06/30/2020)         ?  ibuprofen (MOTRIN) 800 mg tablet  Take 1 Tablet by  mouth every eight (8) hours as needed for Pain. Indications: pain  30 Tablet  0     ?  losartan-hydroCHLOROthiazide (HYZAAR) 100-25 mg per tablet  Take 1 Tab by mouth daily.         ?  allopurinoL (ZYLOPRIM) 100 mg tablet  Take  by mouth daily. am         ?  cloNIDine HCL (CATAPRES) 0.1 mg tablet  Take  by mouth two (2) times a day.         ?  acetaminophen (TYLENOL ARTHRITIS PAIN) 650 mg TbER  Take 650 mg by mouth as needed.         ?  B.infantis-B.ani-B.long-B.bifi (PROBIOTIC 4X) 10-15 mg TbEC  Take  by mouth daily. Phillips probiotic, unsure of dose               ?  methocarbamol (ROBAXIN) 500 mg tablet  Take 1 Tab by mouth three (3) times daily as needed (muscle spasm). (Patient not taking: Reported on 01/11/2020)  20 Tab  0     This is NOT an updated list of her medications. She states she cannot recall them all but reports she takes clonidine, losartan, and HCTZ.         Past History        Past Medical History:     Past Medical History:        Diagnosis  Date         ?  Anemia            many years ago         ?  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  Gout       ?  Hypercholesteremia       ?  Hypertension       ?  Kidney cysts       ?  Kidney stones       ?  Obesity       ?  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  HX COLONOSCOPY         ?  HX DILATION AND CURETTAGE         ?  HX ENDOSCOPY              "several times"          ?  HX TONSILLECTOMY    age 33          ?  PR LAP GASTRIC BYPASS/ROUX-EN-Y               Family History:     Family History         Problem  Relation  Age of Onset          ?  Cancer  Mother       ?  Dementia  Mother            ?  Stroke  Father             Social History:     Social History          Tobacco Use         ?  Smoking status:  Former Smoker     ?  Smokeless tobacco:  Never Used        ?  Tobacco comment: 30-40  years       Substance Use Topics         ?  Alcohol use:  Yes             Comment: wine during the holidays         ?  Drug use:  Never           Allergies:     Allergies        Allergen  Reactions         ?  Percocet [Oxycodone-Acetaminophen]  Anaphylaxis         ?  Lactose  Diarrhea                Review of Systems     Review of Systems    Constitutional: Negative for fever and unexpected weight change.    HENT: Negative for sore throat and trouble swallowing.     Eyes: Negative for pain and visual disturbance.    Respiratory: Positive for shortness of breath. Negative for cough.     Cardiovascular: Positive for chest pain. Negative for leg swelling.    Gastrointestinal: Negative for abdominal pain, blood in stool and vomiting.    Endocrine: Negative for polydipsia and polyuria.    Genitourinary: Negative for dysuria and hematuria.    Musculoskeletal: Negative for neck pain and neck stiffness.    Skin: Negative for rash and wound.    Neurological: Positive for dizziness. Negative for syncope and headaches.  Physical Exam     Physical Exam   Constitutional :        General: She is not in acute distress.     Appearance: She is well-developed.    HENT:       Head: Normocephalic and atraumatic.   Eyes :       Extraocular Movements: Extraocular movements intact.      Pupils: Pupils are equal, round, and reactive to light.    Neck:       Vascular: No JVD.   Cardiovascular :       Rate and Rhythm: Normal rate and regular rhythm.      Pulses:            Radial pulses are 2+ on the right side and 2+  on the left side.         Dorsalis pedis pulses are 2+ on the right side and 2+  on the left side.      Heart sounds: Normal heart sounds.    Pulmonary:       Effort: Pulmonary effort is normal. No tachypnea or respiratory distress.      Breath sounds: Normal breath sounds.    Abdominal:      Palpations: Abdomen is soft.      Tenderness: There is no abdominal tenderness. There is no rebound.     Musculoskeletal:       Right lower leg: No tenderness. No edema.      Left lower leg: No tenderness. No edema.    Skin:      General: Skin is warm and dry.      Capillary Refill: Capillary refill takes less than 2 seconds.    Neurological:       General: No focal deficit present.      Mental Status: She is alert and oriented to person, place, and time.    Psychiatric:         Mood and Affect: Mood normal.         Behavior: Behavior normal.               Diagnostic Study Results        Labs -         Recent Results (from the past 24 hour(s))     CBC WITH AUTOMATED DIFF          Collection Time: 08/23/20  4:00 PM         Result  Value  Ref Range            WBC  8.3  4.6 - 13.2 K/uL       RBC  5.54 (H)  4.20 - 5.30 M/uL       HGB  13.3  12.0 - 16.0 g/dL       HCT  43.1  35.0 - 45.0 %       MCV  77.8 (L)  78.0 - 100.0 FL       MCH  24.0  24.0 - 34.0 PG       MCHC  30.9 (L)  31.0 - 37.0 g/dL       RDW  15.1 (H)  11.6 - 14.5 %       PLATELET  267  135 - 420 K/uL       MPV  13.1 (H)  9.2 - 11.8 FL       NRBC  0.0  0 PER 100 WBC       ABSOLUTE  NRBC  0.00  0.00 - 0.01 K/uL       NEUTROPHILS  56  40 - 73 %       LYMPHOCYTES  34  21 - 52 %       MONOCYTES  8  3 - 10 %       EOSINOPHILS  2  0 - 5 %       BASOPHILS  0  0 - 2 %       IMMATURE GRANULOCYTES  0  0.0 - 0.5 %       ABS. NEUTROPHILS  4.7  1.8 - 8.0 K/UL       ABS. LYMPHOCYTES  2.8  0.9 - 3.6 K/UL       ABS. MONOCYTES  0.6  0.05 - 1.2 K/UL       ABS. EOSINOPHILS  0.1  0.0 - 0.4 K/UL       ABS. BASOPHILS  0.0  0.0 - 0.1 K/UL       ABS. IMM. GRANS.  0.0  0.00 - 0.04 K/UL       DF  AUTOMATED          METABOLIC PANEL, COMPREHENSIVE          Collection Time: 08/23/20  4:00 PM         Result  Value  Ref Range            Sodium  138  136 - 145 mmol/L       Potassium  3.4 (L)  3.5 - 5.5 mmol/L       Chloride  104  100 - 111 mmol/L       CO2  28  21 - 32 mmol/L       Anion gap  6  3.0 - 18 mmol/L       Glucose  114 (H)  74 - 99 mg/dL       BUN  23 (H)  7.0 - 18 MG/DL       Creatinine  1.55 (H)  0.6 -  1.3 MG/DL       BUN/Creatinine ratio  15  12 - 20         GFR est AA  41 (L)  >60 ml/min/1.17m       GFR est non-AA  34 (L)  >60 ml/min/1.798m      Calcium  9.5  8.5 - 10.1 MG/DL       Bilirubin, total  0.4  0.2 - 1.0 MG/DL       ALT (SGPT)  21  13 - 56 U/L       AST (SGOT)  17  10 - 38 U/L       Alk. phosphatase  128 (H)  45 - 117 U/L       Protein, total  8.5 (H)  6.4 - 8.2 g/dL       Albumin  3.5  3.4 - 5.0 g/dL       Globulin  5.0 (H)  2.0 - 4.0 g/dL       A-G Ratio  0.7 (L)  0.8 - 1.7         NT-PRO BNP          Collection Time: 08/23/20  4:00 PM         Result  Value  Ref Range            NT pro-BNP  26  0 - 900 PG/ML  TROPONIN-HIGH SENSITIVITY          Collection Time: 08/23/20  4:00 PM         Result  Value  Ref Range            Troponin-High Sensitivity  7  0 - 54 ng/L       MAGNESIUM          Collection Time: 08/23/20  4:00 PM         Result  Value  Ref Range            Magnesium  2.5  1.6 - 2.6 mg/dL       TSH 3RD GENERATION          Collection Time: 08/23/20  4:00 PM         Result  Value  Ref Range            TSH  1.29  0.36 - 3.74 uIU/mL       TROPONIN-HIGH SENSITIVITY          Collection Time: 08/23/20  7:20 PM         Result  Value  Ref Range            Troponin-High Sensitivity  7  0 - 54 ng/L       EKG, 12 LEAD, INITIAL          Collection Time: 08/23/20  9:11 PM         Result  Value  Ref Range            Ventricular Rate  79  BPM       Atrial Rate  79  BPM       P-R Interval  150  ms       QRS Duration  72  ms       Q-T Interval  380  ms       QTC Calculation (Bezet)  435  ms       Calculated P Axis  51  degrees       Calculated R Axis  -11  degrees       Calculated T Axis  78  degrees       Diagnosis                 Normal sinus rhythm   Minimal voltage criteria for LVH, may be normal variant ( R in aVL )   Nonspecific T wave abnormality   Abnormal ECG   When compared with ECG of 09-May-2018 17:13,   No significant change was found              Radiologic Studies -      XR CHEST PORT        Final Result          Mild perihilar interstitial prominence, likely simulated by low lung volumes,     but may represent mild perihilar infiltrate/edema.                 CT Results   (Last 48 hours)          None                 CXR Results   (Last 48 hours)                                    08/23/20 1647  XR CHEST PORT  Final result            Impression:           Mild perihilar interstitial prominence, likely simulated by low lung volumes,      but may represent mild perihilar infiltrate/edema.                       Narrative:    EXAMINATION: Chest single view             INDICATION: Chest pain             COMPARISON: None             FINDINGS: Single frontal view the chest obtained. Low lung volumes and      underpenetration limits evaluation. Mediastinal silhouette normal in size. Mild      perihilar interstitial prominence. Aortic arch calcifications. No definite      pneumothorax. No acute osseous findings.                                    Medical Decision Making     I am the first provider for this patient.      I reviewed the vital signs, available nursing notes, past medical history, past surgical history, family history and social history.      Vital Signs-Reviewed the patient's vital signs.   Patient Vitals for the past 24 hrs:           Pulse  Resp  BP  SpO2           08/23/20 1830  81  22  103/68  98 %           08/23/20 1700  87  --  107/65  100 %              Records Reviewed: Nursing Notes, Old Medical Records, Previous electrocardiograms  and Previous Laboratory Studies      Provider Notes (Medical Decision Making)/ED course:    65 year old woman with history of HTN, sarcoidosis in remission, s/p gastric bypass presents with 10 days of what sounds like palpitations with associated chest pain, lightheadedness with standing/change  in position.        1550 -  Initial assessment performed. The patients presenting problems have been discussed, and they are in agreement with the care plan  formulated and outlined with them.  I have encouraged them to ask questions as they arise throughout their visit.      Differential includes ACS, PACs/PVCs, arrhythmia, dehydration, CHF, cardiac tamponade, PTX, pleural effusion,       EKG, labs, and imaging personally interpreted by myself and Dr. Agapito Games as:      EKG interpretation: (Preliminary)   Rhythm: normal sinus rhythm; and regular . Rate (approx.): 95; Axis: left axis deviation; PR interval: normal; QRS interval: normal ; ST/T wave: T wave inverted in leads I and aVL; Other findings:  new T-wave inversions from prior.      Labs: Trop 7. mild increase in Cr from prior, at 1.55 from 1.4 and Bun 23 from 20. Normal Hb, platelets and WBC. K 3.4.        Will give 1 L fluids.       Imaging: CXR - no PTX, no pleural effusion.       2045 - repeat troponin stable at 7.  2111 - repeat ECG unchanged from ealrier, rate 79, sinus rhythm, Left axis deviation, t wave inversions I and aVL.       2120 - Discussed case with Lemar Lofty, NP, cardiology who recommends outpatient evaluation with monitor, echocardiogram, and stress testing. Will provide contact information to the patient. She recommends stopping the hydrochlorothiazide and starting  metoprolol 25 mg twice a day.      Discussed recommendation with patient. Recommended she continue home clonidine given risk for rebound hypertension if she acutely stops. Recommended stopping HCTZ. Recommended starting 12.5 mg metoprolol twice a day. Discussed that she should stop if  she has worsened dizziness or lightheadedness with the medication. Contact information for follow-up provided.       Daegen Berrocal Ronnie Derby, MD         Disposition:   Home   DC- Adult Discharges: All of the diagnostic tests were reviewed and questions answered. Diagnosis, care plan and treatment options were discussed.  The patient understands the instructions and will follow up as directed. The patients results have been  reviewed with them.  They  have been counseled regarding their diagnosis.  The patient verbally convey understanding and agreement of the signs, symptoms, diagnosis, treatment and prognosis and additionally agrees to follow up as recommended with the cardiologist  and she will cal and schedule an appointment.  They also agree with the care-plan and convey that all of their questions have been answered.  I have also put together some discharge instructions for them that include: 1) educational information regarding  their diagnosis, 2) how to care for their diagnosis at home, as well a 3) list of reasons why they would want to return to the ED prior to their follow-up appointment, should their condition change.      DISCHARGE PLAN:   1.      Current Discharge Medication List              START taking these medications          Details        metoprolol tartrate (LOPRESSOR) 25 mg tablet  Take 0.5 Tablets by mouth two (2) times a day.   Qty: 60 Tablet, Refills:  0   Start date: 08/23/2020                      2.      Follow-up Information               Follow up With  Specialties  Details  Why  Contact Info              Daymond, Sanjuana Letters, MD  Family Medicine  Schedule an appointment as soon as possible for a visit in 3 days    [place] [id number]   [phone]                 [facility] EMERGENCY DEPT  Emergency Medicine    As needed, If symptoms worsen  [place]              Tona Sensing, MD  Cardiology, Internal Medicine  Schedule an appointment as soon as possible for a visit in 1 day  I spoke with Lemar Lofty, NP who works with Dr. Posey Pronto at [facility]. Please call and schedule an appointment. She  recommends heart monitoring, echocardiogram,  and stress testing. Please see medication recommendations below.  [address]   [address]   [facility]   [place] [address]   [phone]                3.  Return to ED if worse         Diagnosis         Clinical Impression:       1.  Chest pain, unspecified type      2.  Palpitations      3.  Chronic kidney disease, unspecified CKD stage         4.  Dizziness            Attestations:      Wayland Salinas, MD      Please note that this dictation was completed with Dragon, the computer voice recognition software.  Quite often unanticipated grammatical, syntax, homophones, and other interpretive errors are  inadvertently transcribed by the computer software.  Please disregard these errors.  Please excuse any errors that have escaped final proofreading.  Thank you.

## 2020-08-24 LAB — EKG 12-LEAD
Atrial Rate: 95 {beats}/min
Diagnosis: NORMAL
P Axis: 32 degrees
P-R Interval: 140 ms
Q-T Interval: 356 ms
QRS Duration: 70 ms
QTc Calculation (Bazett): 447 ms
R Axis: -21 degrees
T Axis: 94 degrees
Ventricular Rate: 95 {beats}/min

## 2020-08-24 LAB — TROPONIN, HIGH SENSITIVITY: Troponin, High Sensitivity: 7 ng/L (ref 0–54)

## 2020-08-24 LAB — EKG, 12 LEAD, INITIAL
Atrial Rate: 95 {beats}/min
Calculated P Axis: 32 degrees
Calculated R Axis: -21 degrees
Calculated T Axis: 94 degrees
Diagnosis: NORMAL
P-R Interval: 140 ms
Q-T Interval: 356 ms
QRS Duration: 70 ms
QTC Calculation (Bezet): 447 ms
Ventricular Rate: 95 {beats}/min

## 2020-08-24 LAB — TROPONIN-HIGH SENSITIVITY: Troponin-High Sensitivity: 7 ng/L (ref 0–54)

## 2020-08-24 MED ORDER — METOPROLOL TARTRATE 25 MG TAB
25 mg | ORAL_TABLET | Freq: Two times a day (BID) | ORAL | 0 refills | Status: AC
Start: 2020-08-24 — End: 2021-04-21

## 2020-08-25 ENCOUNTER — Ambulatory Visit: Attending: Specialist | Primary: Family Medicine

## 2020-08-25 ENCOUNTER — Ambulatory Visit: Admit: 2020-08-25 | Discharge: 2020-08-25 | Payer: MEDICARE | Attending: Specialist | Primary: Family Medicine

## 2020-08-25 DIAGNOSIS — R079 Chest pain, unspecified: Secondary | ICD-10-CM

## 2020-08-25 NOTE — Progress Notes (Signed)
HISTORY OF PRESENT ILLNESS  Yvonne Barton is a 65 y.o. female.    08/25/2020  Patient seen today for new patient evaluation.  She is here for evaluation of chest pain.  She was in emergency room recently with complaint of chest tightness going on for about 10 days.  She has been having episode of chest tightness.  Also has been feeling palpitation.  These are progressive.  She is short of breath and only heavy exertion.  No edema.  No orthopnea she does notice some breathing disturbance during sleep according to her spouse.      Review of Systems   Constitutional: Negative for chills and fever.   HENT: Negative for nosebleeds.    Eyes: Negative for blurred vision and double vision.   Respiratory: Negative for cough, hemoptysis, sputum production, shortness of breath and wheezing.    Cardiovascular: Positive for chest pain and palpitations. Negative for orthopnea, claudication, leg swelling and PND.   Gastrointestinal: Negative for abdominal pain, heartburn, nausea and vomiting.   Musculoskeletal: Negative for myalgias.   Skin: Negative for rash.   Neurological: Negative for dizziness, weakness and headaches.   Endo/Heme/Allergies: Does not bruise/bleed easily.     Family History   Problem Relation Age of Onset   ??? Cancer Mother    ??? Dementia Mother    ??? Heart Failure Mother    ??? Stroke Father        Past Medical History:   Diagnosis Date   ??? Anemia     many years ago   ??? 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 Gout    ??? Hypercholesteremia    ??? Hypertension    ??? Kidney cysts    ??? Kidney stones    ??? Obesity    ??? Sarcoidosis unsure    pt states in remission "for a long time"       Past Surgical History:   Procedure Laterality Date   ??? HX COLONOSCOPY     ??? HX DILATION AND CURETTAGE     ??? HX ENDOSCOPY      "several times"   ??? HX TONSILLECTOMY  age 82   ??? PR LAP GASTRIC BYPASS/ROUX-EN-Y         Social History     Tobacco Use   ??? Smoking status: Former Smoker   ??? Smokeless tobacco: Never Used   ??? Tobacco  comment: 30-40 years   Substance Use Topics   ??? Alcohol use: Yes     Comment: wine during the holidays       Allergies   Allergen Reactions   ??? Percocet [Oxycodone-Acetaminophen] Anaphylaxis   ??? Lactose Diarrhea       Prior to Admission medications    Medication Sig Start Date End Date Taking? Authorizing Provider   metoprolol tartrate (LOPRESSOR) 25 mg tablet Take 0.5 Tablets by mouth two (2) times a day. 08/23/20  Yes Wayland Salinas, MD   cholecalciferol, vitamin D3, 50 mcg (2,000 unit) tab Take 1 Tablet by mouth daily. 05/25/20  Yes Provider, Historical   losartan (COZAAR) 100 mg tablet Take 100 mg by mouth daily. 06/14/20 06/14/21 Yes Provider, Historical   sodium bicarbonate 650 mg tablet Take 650 mg by mouth two (2) times a day. 05/25/20  Yes Provider, Historical   ibuprofen (MOTRIN) 800 mg tablet Take 1 Tablet by mouth every eight (8) hours as needed for Pain. Indications: pain 01/12/20  Yes Clyda Hurdle, MD  allopurinoL (ZYLOPRIM) 100 mg tablet Take  by mouth as needed. am   Yes Provider, Historical   cloNIDine HCL (CATAPRES) 0.1 mg tablet Take 0.1 mg by mouth two (2) times a day.   Yes Provider, Historical   acetaminophen (TYLENOL ARTHRITIS PAIN) 650 mg TbER Take 650 mg by mouth as needed.   Yes Provider, Historical   B.infantis-B.ani-B.long-B.bifi (PROBIOTIC 4X) 10-15 mg TbEC Take  by mouth daily. Phillips probiotic, unsure of dose   Yes Provider, Historical         Visit Vitals  BP 134/76   Pulse 93   Ht 5\' 3"  (1.6 m)   Wt 111 kg (244 lb 12.8 oz)   SpO2 95%   BMI 43.36 kg/m??       Physical Exam  Constitutional:       Appearance: She is well-developed.   HENT:      Head: Normocephalic and atraumatic.   Eyes:      Conjunctiva/sclera: Conjunctivae normal.   Neck:      Thyroid: No thyromegaly.      Vascular: No JVD.      Trachea: No tracheal deviation.   Cardiovascular:      Rate and Rhythm: Normal rate and regular rhythm.      Chest Wall: PMI is not displaced.      Pulses: No decreased pulses.      Heart  sounds: No murmur heard.  No gallop. No S3 sounds.    Pulmonary:      Effort: No respiratory distress.      Breath sounds: No wheezing or rales.   Chest:      Chest wall: No tenderness.   Abdominal:      Palpations: Abdomen is soft.      Tenderness: There is no abdominal tenderness.   Musculoskeletal:      Cervical back: Neck supple.   Skin:     General: Skin is warm.   Neurological:      Mental Status: She is alert and oriented to person, place, and time.         Ms. Quinonez has a reminder for a "due or due soon" health maintenance. I have asked that she contact her primary care provider for follow-up on this health maintenance.    No flowsheet data found.  I have personally reviewed patient's records available from hospital and other providers and incorporated findings in patient care.  Provider notes, lab, EKG, chest x-ray  I have personally reviewed patients ekg done at other facility.  Sinus rhythm, LVH, nonspecific ST-T changes    Assessment         ICD-10-CM ICD-9-CM    1. Chest pain, unspecified type  R07.9 786.50 ECHO ADULT COMPLETE      NUCLEAR CARDIAC STRESS TEST      CT HEART W/O CONT WITH CALCIUM      CARDIAC EVENT MONITOR      LIPID PANEL    Atypical.  Possible angina, chest wall, GERD   2. Primary hypertension  I10 401.9 ECHO ADULT COMPLETE      NUCLEAR CARDIAC STRESS TEST      CT HEART W/O CONT WITH CALCIUM      CARDIAC EVENT MONITOR      LIPID PANEL    Stable monitor continue treatment.   3. Abnormal EKG  R94.31 794.31 ECHO ADULT COMPLETE      NUCLEAR CARDIAC STRESS TEST      CARDIAC EVENT MONITOR    Possible LVH, ischemia  4. Lung granuloma ([place])  J84.10 515 ECHO ADULT COMPLETE      NUCLEAR CARDIAC STRESS TEST      CT HEART W/O CONT WITH CALCIUM      CARDIAC EVENT MONITOR    Lung and spleen granuloma noted on past CT scans..  History of sarcoidosis.  Currently in remission   5. Palpitation  R00.2 785.1 ECHO ADULT COMPLETE      NUCLEAR CARDIAC STRESS TEST      CT HEART W/O CONT WITH CALCIUM       CARDIAC EVENT MONITOR    Rule out arrhythmia recent worsening     08/2020  Seen for atypical chest pain and palpitation.  Has history of sarcoidosis in past.  Follow-up after testing.  If needed consider sleep apnea evaluation  Medications Discontinued During This Encounter   Medication Reason   ??? ciprofloxacin HCl (CIPRO) 500 mg tablet Therapy Completed   ??? hydroCHLOROthiazide (HYDRODIURIL) 25 mg tablet Not A Current Medication   ??? megestroL (MEGACE) 40 mg tablet Not A Current Medication   ??? spironolactone (ALDACTONE) 25 mg tablet Not A Current Medication   ??? traMADoL (ULTRAM) 50 mg tablet Not A Current Medication   ??? methocarbamol (ROBAXIN) 500 mg tablet Not A Current Medication   ??? losartan-hydroCHLOROthiazide (HYZAAR) 100-25 mg per tablet Not A Current Medication       Orders Placed This Encounter   ??? CT HEART W/O CONT WITH CALCIUM     Standing Status:   Future     Standing Expiration Date:   09/24/2021   ??? LIPID PANEL     Standing Status:   Future     Standing Expiration Date:   02/23/2021   ??? ECHO ADULT COMPLETE     Standing Status:   Future     Standing Expiration Date:   08/25/2021     Order Specific Question:   Contrast Enhancement (Bubble Study, Definity, Optison) may be used if criteria listed in established evidence-based protocol has been identified.     Answer:   Yes       Follow-up and Dispositions    ?? Return for F/u after tests.

## 2020-09-08 ENCOUNTER — Inpatient Hospital Stay: Admit: 2020-09-08 | Payer: Self-pay | Attending: Specialist | Primary: Family Medicine

## 2020-09-08 DIAGNOSIS — I10 Essential (primary) hypertension: Secondary | ICD-10-CM

## 2020-09-08 NOTE — Progress Notes (Signed)
Moderate coronary calcification.  Start atorvastatin 40 mg a day.  Aspirin a day.  Elevated LDL.  Lipid LFT 4 weeks.  We will await the result of stress test and follow-up to discuss further.  Also has fatty liver deposit.  Follow-up with PCP further

## 2020-09-15 ENCOUNTER — Telehealth

## 2020-09-15 MED ORDER — ATORVASTATIN 40 MG TAB
40 mg | ORAL_TABLET | Freq: Every day | ORAL | 3 refills | Status: DC
Start: 2020-09-15 — End: 2020-12-12

## 2020-09-15 NOTE — Telephone Encounter (Signed)
-----   Message from Tona Sensing, MD sent at 09/15/2020  7:47 AM EDT -----  Moderate coronary calcification.  Start atorvastatin 40 mg a day.  Aspirin a day.  Elevated LDL.  Lipid LFT 4 weeks.  We will await the result of stress test and follow-up to discuss further.  Also has fatty liver deposit.  Follow-up with PCP further

## 2020-09-15 NOTE — Telephone Encounter (Signed)
Requested Prescriptions     Pending Prescriptions Disp Refills   . atorvastatin (LIPITOR) 40 mg tablet 30 Tablet 3     Sig: Take 1 Tablet by mouth daily.

## 2020-09-15 NOTE — Telephone Encounter (Signed)
Spoke with patient per Dr. Posey Pronto regarding lab/test. Test reviewed. Moderate coronary calcification. Start Atorvastatin 40 mg a day. Aspirin a day. Elevated LDL. Lipid/lft in 4 weeks.we will await the result of stress test and follow up to discuss further.  Also has fatty liver deposit. follow up with PCP further. She voices understanding and acceptance of this advice and will call back if any further questions or concerns.

## 2020-09-23 ENCOUNTER — Ambulatory Visit

## 2020-09-23 ENCOUNTER — Encounter: Primary: Family Medicine

## 2020-09-23 ENCOUNTER — Ambulatory Visit: Admit: 2020-09-23 | Payer: MEDICARE | Primary: Family Medicine

## 2020-09-23 DIAGNOSIS — R079 Chest pain, unspecified: Secondary | ICD-10-CM

## 2020-09-23 LAB — TRANSTHORACIC ECHOCARDIOGRAM (TTE) COMPLETE (CONTRAST/BUBBLE/3D PRN)
Ao Root Index: 1.52 cm/m2
Aortic Root: 3.2 cm
Fractional Shortening 2D: 10 % (ref 28–44)
IVSd: 1.2 cm — AB (ref 0.6–0.9)
LA Volume 2C: 40 mL (ref 22–52)
LA Volume 4C: 38 mL (ref 22–52)
LA Volume A/L: 43 mL
LA Volume Index 2C: 19 mL/m2 (ref 16–34)
LA Volume Index 4C: 18 mL/m2 (ref 16–34)
LA Volume Index A/L: 20 mL/m2 (ref 16–34)
LV Mass 2D Index: 50.9 g/m2 (ref 43–95)
LV Mass 2D: 106.8 g (ref 67–162)
LV RWT Ratio: 0.71
LVIDd Index: 1.48 cm/m2
LVIDd: 3.1 cm — AB (ref 3.9–5.3)
LVIDs Index: 1.33 cm/m2
LVIDs: 2.8 cm
LVOT Area: 3.1 cm2
LVOT Diameter: 2 cm
LVOT Mean Gradient: 1 mmHg
LVOT Peak Gradient: 2 mmHg
LVOT Peak Velocity: 0.7 m/s
LVOT SV: 53.1 ml
LVOT Stroke Volume Index: 25.3 mL/m2
LVOT VTI: 16.9 cm
LVPWd: 1.1 cm — AB (ref 0.6–0.9)
Left Ventricular Ejection Fraction: 58
MV A Velocity: 0.81 m/s
MV Area by PHT: 4.1 cm2
MV E Velocity: 0.62 m/s
MV E Wave Deceleration Time: 187 ms
MV E/A: 0.77
MV PHT: 54.2 ms

## 2020-09-23 LAB — ECHO ADULT COMPLETE
Ao Root Index: 1.52 cm/m2
Aortic Root: 3.2 cm
Fractional Shortening 2D: 10 % (ref 28–44)
IVSd: 1.2 cm — AB (ref 0.6–0.9)
LA Volume 2C: 40 mL (ref 22–52)
LA Volume 4C: 38 mL (ref 22–52)
LA Volume A/L: 43 mL
LA Volume Index 2C: 19 mL/m2 (ref 16–34)
LA Volume Index 4C: 18 mL/m2 (ref 16–34)
LA Volume Index A/L: 20 mL/m2 (ref 16–34)
LV Mass 2D Index: 50.9 g/m2 (ref 43–95)
LV Mass 2D: 106.8 g (ref 67–162)
LV RWT Ratio: 0.71
LVIDd Index: 1.48 cm/m2
LVIDd: 3.1 cm — AB (ref 3.9–5.3)
LVIDs Index: 1.33 cm/m2
LVIDs: 2.8 cm
LVOT Area: 3.1 cm2
LVOT Diameter: 2 cm
LVOT Mean Gradient: 1 mmHg
LVOT Peak Gradient: 2 mmHg
LVOT Peak Velocity: 0.7 m/s
LVOT SV: 53.1 ml
LVOT Stroke Volume Index: 25.3 mL/m2
LVOT VTI: 16.9 cm
LVPWd: 1.1 cm — AB (ref 0.6–0.9)
MV A Velocity: 0.81 m/s
MV Area by PHT: 4.1 cm2
MV E Velocity: 0.62 m/s
MV E Wave Deceleration Time: 187 ms
MV E/A: 0.77
MV PHT: 54.2 ms

## 2020-09-30 ENCOUNTER — Ambulatory Visit

## 2020-09-30 ENCOUNTER — Ambulatory Visit: Admit: 2020-09-30 | Payer: MEDICARE | Primary: Family Medicine

## 2020-09-30 DIAGNOSIS — R079 Chest pain, unspecified: Secondary | ICD-10-CM

## 2020-09-30 LAB — NM STRESS TEST WITH MYOCARDIAL PERFUSION
Angina Index: 0
Baseline Diastolic BP: 98 mmHg
Baseline HR: 77 {beats}/min
Baseline ST Depression: 0 mm
Baseline Systolic BP: 162 mmHg
Duke Treadmill Score: 3
Exercise Duration Time: 2 min
Exercuse Duration Seconds: 57 s
Left Ventricular Ejection Fraction: 66
Nuc Stress Diastolic Volume Index: 60 mL/m2
Nuc Stress EF: 66 %
Nuc Stress Systolic Volume Index: 20 mL/m2
Recovery Stage 1 HR: 120 {beats}/min
Recovery Stage 2 HR: 88 {beats}/min
Stress Diastolic BP: 102 mmHg
Stress Estimated Workload: 4.6 METS
Stress Peak HR: 144 {beats}/min
Stress Percent HR Achieved: 94 %
Stress Rate Pressure Product: 24480 BPM*mmHg
Stress ST Depression: 0 mm
Stress Stage 1 HR: 141 {beats}/min
Stress Systolic BP: 170 mmHg
Stress Target HR: 154 {beats}/min
TID: 1.09

## 2020-09-30 LAB — NUCLEAR CARDIAC STRESS TEST
Angina Index: 0
Baseline Diastolic BP: 98 mmHg
Baseline HR: 77 {beats}/min
Baseline ST Depression: 0 mm
Baseline Systolic BP: 162 mmHg
Duke Treadmill Score: 3
Exercise Duration Time: 2 min
Exercuse Duration Seconds: 57 s
Nuc Stress Diastolic Volume Index: 60 mL/m2
Nuc Stress EF: 66 %
Nuc Stress Systolic Volume Index: 20 mL/m2
Recovery Stage 1 HR: 120 {beats}/min
Recovery Stage 2 HR: 88 {beats}/min
Stress Diastolic BP: 102 mmHg
Stress Estimated Workload: 4.6 METS
Stress Peak HR: 144 {beats}/min
Stress Percent HR Achieved: 94 %
Stress Rate Pressure Product: 24480 BPM*mmHg
Stress ST Depression: 0 mm
Stress Stage 1 HR: 141 {beats}/min
Stress Systolic BP: 170 mmHg
Stress Target HR: 154 {beats}/min
TID: 1.09

## 2020-09-30 MED ORDER — TECHNETIUM TC-99M TETROFOSMIN 1.38 MG IV SOLUTION KIT
1.38 mg | Freq: Once | INTRAVENOUS | Status: AC
Start: 2020-09-30 — End: 2020-09-30
  Administered 2020-09-30: 13:00:00 via INTRAVENOUS

## 2020-09-30 MED ORDER — TECHNETIUM TC-99M TETROFOSMIN 1.38 MG IV SOLUTION KIT
1.38 mg | Freq: Once | INTRAVENOUS | Status: AC
Start: 2020-09-30 — End: 2020-09-30
  Administered 2020-09-30: 15:00:00 via INTRAVENOUS

## 2020-10-05 ENCOUNTER — Encounter

## 2020-10-07 ENCOUNTER — Ambulatory Visit: Attending: Specialist | Primary: Family Medicine

## 2020-10-07 ENCOUNTER — Ambulatory Visit: Admit: 2020-10-07 | Discharge: 2020-10-07 | Payer: MEDICARE | Attending: Specialist | Primary: Family Medicine

## 2020-10-07 DIAGNOSIS — I2510 Atherosclerotic heart disease of native coronary artery without angina pectoris: Secondary | ICD-10-CM

## 2020-10-07 NOTE — Progress Notes (Signed)
1. Have you been to the ER, urgent care clinic since your last visit?  Hospitalized since your last visit?     no  2. Have you seen or consulted any other health care providers outside of the [place] since your last visit?  Include any pap smears or colon screening.      No     3.  Since your last visit, have you had any of the following symptoms?      palpitations, shortness of breath and dizziness.

## 2020-10-07 NOTE — Progress Notes (Signed)
HISTORY OF PRESENT ILLNESS  Yvonne Barton is a 65 y.o. female.    08/25/2020  Patient seen today for new patient evaluation.  She is here for evaluation of chest pain.  She was in emergency room recently with complaint of chest tightness going on for about 10 days.  She has been having episode of chest tightness.  Also has been feeling palpitation.  These are progressive.  She is short of breath and only heavy exertion.  No edema.  No orthopnea she does notice some breathing disturbance during sleep according to her spouse.      Review of Systems   Constitutional: Negative for chills and fever.   HENT: Negative for nosebleeds.    Eyes: Negative for blurred vision and double vision.   Respiratory: Negative for cough, hemoptysis, sputum production, shortness of breath and wheezing.    Cardiovascular: Positive for chest pain and palpitations. Negative for orthopnea, claudication, leg swelling and PND.   Gastrointestinal: Negative for abdominal pain, heartburn, nausea and vomiting.   Musculoskeletal: Negative for myalgias.   Skin: Negative for rash.   Neurological: Negative for dizziness, weakness and headaches.   Endo/Heme/Allergies: Does not bruise/bleed easily.   I saw and  Family History   Problem Relation Age of Onset   ??? Cancer Mother    ??? Dementia Mother    ??? Heart Failure Mother    ??? Stroke Father        Past Medical History:   Diagnosis Date   ??? Anemia     many years ago   ??? 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 Gout    ??? Hypercholesteremia    ??? Hypertension    ??? Kidney cysts    ??? Kidney stones    ??? Obesity    ??? Sarcoidosis unsure    pt states in remission "for a long time"       Past Surgical History:   Procedure Laterality Date   ??? HX COLONOSCOPY     ??? HX DILATION AND CURETTAGE     ??? HX ENDOSCOPY      "several times"   ??? HX TONSILLECTOMY  age 19   ??? PR LAP GASTRIC BYPASS/ROUX-EN-Y         Social History     Tobacco Use   ??? Smoking status: Former Smoker   ??? Smokeless tobacco: Never Used   ???  Tobacco comment: 30-40 years   Substance Use Topics   ??? Alcohol use: Yes     Comment: wine during the holidays       Allergies   Allergen Reactions   ??? Percocet [Oxycodone-Acetaminophen] Anaphylaxis   ??? Lactose Diarrhea       Prior to Admission medications    Medication Sig Start Date End Date Taking? Authorizing Provider   aspirin delayed-release 81 mg tablet Take 81 mg by mouth daily.   Yes Provider, Historical   atorvastatin (LIPITOR) 40 mg tablet Take 1 Tablet by mouth daily. 09/15/20  Yes Tona Sensing, MD   metoprolol tartrate (LOPRESSOR) 25 mg tablet Take 0.5 Tablets by mouth two (2) times a day. 08/23/20  Yes Wayland Salinas, MD   cholecalciferol, vitamin D3, 50 mcg (2,000 unit) tab Take 1 Tablet by mouth daily. 05/25/20  Yes Provider, Historical   losartan (COZAAR) 100 mg tablet Take 100 mg by mouth daily. 06/14/20 06/14/21 Yes Provider, Historical   sodium bicarbonate 650 mg tablet Take 650 mg by mouth  two (2) times a day. 05/25/20  Yes Provider, Historical   ibuprofen (MOTRIN) 800 mg tablet Take 1 Tablet by mouth every eight (8) hours as needed for Pain. Indications: pain 01/12/20  Yes Clyda Hurdle, MD   allopurinoL (ZYLOPRIM) 100 mg tablet Take  by mouth as needed. am   Yes Provider, Historical   cloNIDine HCL (CATAPRES) 0.1 mg tablet Take 0.1 mg by mouth two (2) times a day.   Yes Provider, Historical   acetaminophen (TYLENOL ARTHRITIS PAIN) 650 mg TbER Take 650 mg by mouth as needed.   Yes Provider, Historical   B.infantis-B.ani-B.long-B.bifi (PROBIOTIC 4X) 10-15 mg TbEC Take  by mouth daily. Phillips probiotic, unsure of dose   Yes Provider, Historical         Visit Vitals  BP (!) 144/78   Pulse 75   Ht 5\' 3"  (1.6 m)   Wt 112.5 kg (248 lb)   SpO2 95%   BMI 43.93 kg/m??       Physical Exam  Constitutional:       Appearance: She is well-developed.   HENT:      Head: Normocephalic and atraumatic.   Eyes:      Conjunctiva/sclera: Conjunctivae normal.   Neck:      Thyroid: No thyromegaly.      Vascular: No  JVD.      Trachea: No tracheal deviation.   Cardiovascular:      Rate and Rhythm: Normal rate and regular rhythm.      Chest Wall: PMI is not displaced.      Pulses: No decreased pulses.      Heart sounds: No murmur heard.  No gallop. No S3 sounds.    Pulmonary:      Effort: No respiratory distress.      Breath sounds: No wheezing or rales.   Chest:      Chest wall: No tenderness.   Abdominal:      Palpations: Abdomen is soft.      Tenderness: There is no abdominal tenderness.   Musculoskeletal:      Cervical back: Neck supple.   Skin:     General: Skin is warm.   Neurological:      Mental Status: She is alert and oriented to person, place, and time.         Yvonne Barton has a reminder for a "due or due soon" health maintenance. I have asked that she contact her primary care provider for follow-up on this health maintenance.    No flowsheet data found.  I have personally reviewed patient's records available from hospital and other providers and incorporated findings in patient care.  Provider notes, lab, EKG, chest x-ray  I have personally reviewed patients ekg done at other facility.  Sinus rhythm, LVH, nonspecific ST-T changes  IMPRESSION.  Chest CT for coronary calcium for 2022     Coronary calcium score 159.  IMPRESSION CT 08/2020     Diffuse hepatic steatosis.  Healed granulomatous disease.  Benign noncalcified small node left lower lobe. No imaging follow-up needed.  Coronary and cardiac findings will be described under a separate report  completed by cardiology.     Interpretation Summary 09/2020       ???  Left Ventricle: Normal left ventricular systolic function with a visually estimated EF of 55 - 60%. Left ventricle size is normal. Increased wall thickness. Findings consistent with mild concentric hypertrophy. Normal wall motion. Diastolic dysfunction present with increased LAP with normal LV EF.  Carlyle Dolly, MD ECG Reader, SPECT Reader, Test Supervisor 09/30/2020       Interpretation Summary  09/2020       ???  Nuclear Findings: Normal left ventricular systolic function post-stress. LVEF measures 66%.  ???  Nuclear Findings: Normal treadmill myocardial perfusion study at 94 %% PHR. Findings suggest a low risk of myocardial ischemia. LVEF measures 66%.  ???  Nuclear Findings: LVEF measures 66%. LV perfusion is normal.  ???  ECG: Resting ECG demonstrates normal sinus rhythm.  ???  ECG: Stress ECG was negative for ischemia.  ???  Stress Test: A Bruce protocol stress test was performed. Overall, the patient's exercise capacity was poor for their age. The patient exercised for 2 min and 57 sec. Hemodynamics are adequate for diagnosis. Blood pressure demonstrated a normal response and heart rate demonstrated a normal response to stress.     09/2020???event monitor  Rare PAC and PVC.  Occasional symptom with PVC  Assessment         ICD-10-CM ICD-9-CM    1. Coronary artery calcification  I25.10 414.00     I25.84 414.4     Moderate calcification.  Symptoms stable.  Continue current medical management   2. Chest pain, unspecified type  R07.9 786.50     Stable.  Negative stress test monitor   3. Primary hypertension  I10 401.9     Mildly elevated systolic pressure monitor usually normal   4. 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  Asymptomatic   5. PVC (premature ventricular contraction)  I49.3 427.69     Continue beta-blocker.  Occasionally associated with palpitation   6. Fatty liver  K76.0 571.8     LFT normal.  Follow-up with PCP.     08/2020  Seen for atypical chest pain and palpitation.  Has history of sarcoidosis in past.  Follow-up after testing.  If needed consider sleep apnea evaluation    09/2020  Work-up reviewed.  Medical management.  Follow-up lab after statin treatment continue aspirin      There are no discontinued medications.    No orders of the defined types were placed in this encounter.      Follow-up and Dispositions    ?? Return in about 6 months (around 04/09/2021).

## 2020-12-08 NOTE — Anesthesia Pre-Procedure Evaluation (Signed)
Relevant Problems   NEUROLOGY   (+) Chronic migraine      CARDIOVASCULAR   (+) Essential hypertension    ,  - allopurinol 100 mg every day      Anesthetic History   No history of anesthetic complications       Comments: Anesthesia consult note done  [facility]      PCP: Franco Nones, MD  Medical clearance scheduled for 12/15/2020    Cardiology: Tona Sensing, MD  Office note in Epic 10/07/2020    Nephrology: Hazeline Junker, MD        COVID vaccine: Has had both vaccines      Review of Systems / Medical History  Patient summary reviewed, nursing notes reviewed and pertinent labs reviewed    Pulmonary          Undiagnosed apnea and smoker ( smoked < 1 PPD - 30 years ago - ETOH rarely)      Comments: 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  Coronary calcium score 159  Comments: EKG ordered by surgeon 12/12/2020    EKG 08/23/2020:  Normal sinus rhythm   Voltage criteria for left ventricular hypertrophy   T wave abnormality, consider lateral ischemia   Abnormal ECG   When compared with ECG of 09-May-2018 17:13,   No significant change was found    ECHO 09/23/2020:  ???  Left??Ventricle: Normal left ventricular systolic function with a visually estimated EF of 55 - 60%.   Left ventricle size is normal. Increased wall thickness. Findings consistent with mild concentric hypertrophy. Normal wall motion.   Diastolic dysfunction present with increased LAP with normal LV EF    Nuclear Stress Test 09/30/2020  ???  Nuclear Findings: Normal left ventricular systolic function post-stress. LVEF measures 66%.  ???  Nuclear Findings: Normal treadmill myocardial  perfusion study at 94 %% PHR. Findings suggest a low risk of myocardial ischemia. LVEF measures 66%.  ???  Nuclear Findings: LVEF measures 66%. LV perfusion is normal.  ???  ECG: Resting ECG demonstrates normal sinus rhythm.  ???  ECG: Stress ECG was negative for ischemia.  ???  Stress??Test: A Bruce protocol stress test was performed. Overall, the patient's exercise capacity was poor for their age. The patient exercised for 2 min and 57 sec. Hemodynamics are adequate for diagnosis. Blood pressure demonstrated a normal response and heart rate demonstrated a normal response to stress.  ??     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  ): type 2    Morbid obesity ( BMI 41.2), arthritis ( Gout - allopurinol 300 mg qd - prn) and anemia ( No iron at this time)    Comments: Vit D3 every day    Sodium Bicarb 650 mg BID per nephrology Other  Findings   Comments: Chest x-ray 08/23/2020:  IMPRESSION  ??-- Mild perihilar interstitial prominence, likely simulated by low lung volumes, but may represent mild perihilar infiltrate/edema         Physical Exam    Airway  Mallampati: II  TM Distance: 4 - 6 cm  Neck ROM: normal range of motion   Mouth opening: Normal     Cardiovascular  Regular rate and rhythm,  S1 and S2 normal,  no murmur, click, rub, or gallop  Rhythm: regular  Rate: normal         Dental  No notable dental hx       Pulmonary  Breath sounds clear to auscultation               Abdominal  GI exam deferred       Other Findings            Anesthetic Plan    ASA: 3  Anesthesia type: general and regional - TAP block          Induction: Intravenous  Anesthetic plan and risks discussed with: Patient      Plan for Postoperative TAP block only if surgeon proceeds with open approach. Pt aware/consented.

## 2020-12-11 ENCOUNTER — Encounter

## 2020-12-12 ENCOUNTER — Inpatient Hospital Stay: Admit: 2020-12-12 | Payer: MEDICARE | Primary: Family Medicine

## 2020-12-12 DIAGNOSIS — Z01810 Encounter for preprocedural cardiovascular examination: Secondary | ICD-10-CM

## 2020-12-12 LAB — EKG 12-LEAD
Atrial Rate: 85 {beats}/min
Diagnosis: NORMAL
P Axis: 39 degrees
P-R Interval: 146 ms
Q-T Interval: 372 ms
QRS Duration: 78 ms
QTc Calculation (Bazett): 442 ms
R Axis: -13 degrees
T Axis: 64 degrees
Ventricular Rate: 85 {beats}/min

## 2020-12-12 LAB — CBC WITH AUTO DIFFERENTIAL
Basophils %: 0.5 % (ref 0–3)
Eosinophils %: 2.1 % (ref 0–5)
Hematocrit: 40.2 % (ref 37.0–50.0)
Hemoglobin: 12.1 gm/dl — ABNORMAL LOW (ref 13.0–17.2)
Immature Granulocytes: 0.1 % (ref 0.0–3.0)
Lymphocytes %: 33.3 % (ref 28–48)
MCH: 23.5 pg — ABNORMAL LOW (ref 25.4–34.6)
MCHC: 30.1 gm/dl (ref 30.0–36.0)
MCV: 78.1 fL — ABNORMAL LOW (ref 80.0–98.0)
MPV: 12.6 fL — ABNORMAL HIGH (ref 6.0–10.0)
Monocytes %: 6.9 % (ref 1–13)
Neutrophils %: 57.1 % (ref 34–64)
Nucleated RBCs: 0 (ref 0–0)
Platelets: 283 10*3/uL (ref 140–450)
RBC: 5.15 M/uL (ref 3.60–5.20)
RDW-SD: 47.7 — ABNORMAL HIGH (ref 36.4–46.3)
WBC: 7.5 10*3/uL (ref 4.0–11.0)

## 2020-12-12 LAB — BASIC METABOLIC PANEL
Anion Gap: 10 mmol/L (ref 5–15)
BUN: 13 mg/dl (ref 9–23)
CO2: 24 mEq/L (ref 20–31)
Calcium: 9.3 mg/dl (ref 8.7–10.4)
Chloride: 108 mEq/L — ABNORMAL HIGH (ref 98–107)
Creatinine: 1.25 mg/dl — ABNORMAL HIGH (ref 0.55–1.02)
EGFR IF NonAfrican American: 46
GFR African American: 55
Glucose: 81 mg/dl (ref 74–106)
Potassium: 3.7 mEq/L (ref 3.4–4.5)
Sodium: 142 mEq/L (ref 136–145)

## 2020-12-12 LAB — BLOOD TYPE, (ABO+RH)
ABO/Rh(D): O POS
ABO/Rh: O POS

## 2020-12-12 LAB — ANTIBODY SCREEN
Antibody Screen: NEGATIVE
Antibody screen: NEGATIVE

## 2020-12-12 LAB — CBC WITH AUTOMATED DIFF
BASOPHILS: 0.5 % (ref 0–3)
EOSINOPHILS: 2.1 % (ref 0–5)
HCT: 40.2 % (ref 37.0–50.0)
HGB: 12.1 gm/dl — ABNORMAL LOW (ref 13.0–17.2)
IMMATURE GRANULOCYTES: 0.1 % (ref 0.0–3.0)
LYMPHOCYTES: 33.3 % (ref 28–48)
MCH: 23.5 pg — ABNORMAL LOW (ref 25.4–34.6)
MCHC: 30.1 gm/dl (ref 30.0–36.0)
MCV: 78.1 fL — ABNORMAL LOW (ref 80.0–98.0)
MONOCYTES: 6.9 % (ref 1–13)
MPV: 12.6 fL — ABNORMAL HIGH (ref 6.0–10.0)
NEUTROPHILS: 57.1 % (ref 34–64)
NRBC: 0 (ref 0–0)
PLATELET: 283 10*3/uL (ref 140–450)
RBC: 5.15 M/uL (ref 3.60–5.20)
RDW-SD: 47.7 — ABNORMAL HIGH (ref 36.4–46.3)
WBC: 7.5 10*3/uL (ref 4.0–11.0)

## 2020-12-12 LAB — EKG, 12 LEAD, INITIAL
Atrial Rate: 85 {beats}/min
Calculated P Axis: 39 degrees
Calculated R Axis: -13 degrees
Calculated T Axis: 64 degrees
Diagnosis: NORMAL
P-R Interval: 146 ms
Q-T Interval: 372 ms
QRS Duration: 78 ms
QTC Calculation (Bezet): 442 ms
Ventricular Rate: 85 {beats}/min

## 2020-12-12 LAB — METABOLIC PANEL, BASIC
Anion gap: 10 mmol/L (ref 5–15)
BUN: 13 mg/dl (ref 9–23)
CO2: 24 mEq/L (ref 20–31)
Calcium: 9.3 mg/dl (ref 8.7–10.4)
Chloride: 108 mEq/L — ABNORMAL HIGH (ref 98–107)
Creatinine: 1.25 mg/dl — ABNORMAL HIGH (ref 0.55–1.02)
GFR est AA: 55
GFR est non-AA: 46
Glucose: 81 mg/dl (ref 74–106)
Potassium: 3.7 mEq/L (ref 3.4–4.5)
Sodium: 142 mEq/L (ref 136–145)

## 2020-12-12 MED ORDER — ATORVASTATIN 40 MG TAB
40 mg | ORAL_TABLET | ORAL | 1 refills | Status: DC
Start: 2020-12-12 — End: 2021-06-09

## 2020-12-12 NOTE — Interval H&P Note (Signed)
 [facility]  Notes by Jessica Cherene LABOR, RN at 12/12/20 1300                Author: Jessica Cherene LABOR, RN  Service: NURSING  Author Type: Registered Nurse       Filed: 12/12/20 1207  Date of Service: 12/12/20 1300  Status: Signed          Editor: Jessica Cherene LABOR, RN (Registered Nurse)                          PREOPERATIVE INSTRUCTIONS   Please Read Carefully      [x]  Your procedure is scheduled on: 12/20/20      [x]  The day before your surgery, call the surgeon's  office to check on the surgery time and the time you should arrive.       [x]  On the day of your surgery, arrive at the time  given to you by your surgeon and check in at the first floor registration desk.      [x]  DO NOT eat or drink anything after midnight before  your surgery. This includes gum, mints or hard candy.      [x]  You may brush your teeth the morning of surgery,  however DO NOT swallow any water.      []  No smoking after midnight.  Smoking should be reduced  a few days prior to surgery.      []  If you are having an outpatient procedure, you  must have a responsible adult bring you to the hospital.  They must remain in the surgical waiting area for the duration of your procedure and provide you with transportation home.  You may not drive yourself home.  We also recommend that you arrange  for a responsible person to stay with you for 24 hours following your procedure.  Failure to comply with these instructions may result in cancellation of the procedure.      []  A parent or legal guardian must accompany minors  to the hospital.  LEGAL GUARDIANS MUST bring custody papers with them the day of surgery.      [x]  If the lab gives you a blue blood ID band, do not  throw it away.  Bring it with you on the day of surgery.        []  Please return to preop surgical testing no more  than 14 days before surgery date to have your type and screen done prior to surgery.     []  If you have been diagnosed with Sleep Apnea  and are using a CPAP, BiPAP, and/or Bi-Flex  machine, please bring it with you the day of surgery.     []  Endoscopy patients, please follow the instructions  provided by the doctors office.     []  If crutches are ordered, you must get them  and be instructed on proper use before the day of surgery.  Please bring them with you the day of surgery.         PREPARING THE SKIN BEFORE SURGERY Can Reduce the Risk of Infection   [x]  You have been given a Chlorhexidine skin preparation  packet with instructions to bathe the night before surgery and the morning of surgery prior to arrival.     []  If allergic to Chlorhexidine, use Dial (antibacterial)  soap to bathe your skin the night before surgery and the morning of surgery.     [x]  Do not shave  your face, underarms, legs or  any part of your body at least 48 hours before surgery.  Any clipping needed for surgery will be done at the hospital.         PREPARING TO COME TO THE HOSPITAL   [x]  Wear casual, loose fitting comfortable clothes  the day of surgery.      [x]  Remove ALL jewelry, including body piercings.   Leave these and all valuables at home.  Leave suitcases and/or overnight bag at home or in the car for a family member to bring when you get a room.    [x]   DO NOT wear any makeup, nail polish, deodorant, body lotion, aftershave or contact lenses the day of surgery.  Please bring your glasses and glasses case with you the day of surgery.    [x]  Bring any additional paperwork, such as doctors orders, consent form, POA (power of attorney), and/or advanced directives with you the day of surgery.    [x]  Please notify your surgeon of any changes in your condition such as fever, sore throat or rash as soon as possible before your surgery date.  After office hours, call your surgeon's  answering service.      [x] Bring picture identification and insurance cards  with you the day of surgery.       MEDICATIONS:   [x]  Stop taking aspirin and aspirin products/NSAIDs  (non-steroidal anti-inflammatory drugs such as Motrin,  Aleve, Advil, ibuprofen and BC Powder), vitamins and herbal pills 2 weeks before surgery OR as instructed by your doctor.  However, if you take a daily aspirin, please contact your primary care provider  (PCP), for instructions prior to surgery.    []   Contact your doctor regarding any blood thinner medications you take (such as Coumadin, Plavix, etc.) for instructions about what to do prior to surgery.      [] If you have diabetes, hold oral diabetic medications  the night before surgery and the morning of surgery.  Hold insulin the morning of surgery unless told otherwise by your doctor.    []   If you have asthma or use inhalers please bring them the day of surgery.      [x] Take the following   bolded medicine with a sip of water the day of surgery: clonidine and metoprolol      Medication Documentation Review Audit              Reviewed by Jessica Cherene LABOR, RN (Registered Nurse) on 12/12/20 at 1203             Medication  Sig  Documenting Provider  Last Dose  Status  Taking?      acetaminophen (TYLENOL ARTHRITIS PAIN) 650 mg TbER  Take 650 mg by mouth as needed.  Provider, Historical    Active        allopurinoL (ZYLOPRIM) 100 mg tablet  Take  by mouth as needed.   Provider, Historical    Active  If needed      aspirin delayed-release 81 mg tablet  Take 81 mg by mouth daily.  Provider, Historical  12/10/20  Active        atorvastatin (LIPITOR) 40 mg tablet  TAKE 1 TABLET BY MOUTH EVERY DAY  Patel, Apurva M, MD    Active        B.infantis-B.ani-B.long-B.bifi (PROBIOTIC 4X) 10-15 mg TbEC  Take  by mouth daily. Phillips probiotic, unsure of dose  Provider, Historical    Active  cholecalciferol, vitamin D3, 50 mcg (2,000 unit) tab  Take 1 Tablet by mouth daily.  Provider, Historical    Active        cloNIDine HCL (CATAPRES) 0.1 mg tablet  Take 0.1 mg by mouth two (2) times a day.  Provider, Historical    Active  yes      ibuprofen (MOTRIN) 800 mg tablet  Take 1 Tablet by mouth every eight (8) hours as needed for  Pain. Indications: pain  Meyer Slough, MD    Active        losartan (COZAAR) 100 mg tablet  Take 100 mg by mouth daily.  Provider, Historical    Active        metoprolol  tartrate (LOPRESSOR ) 25 mg tablet  Take 0.5 Tablets by mouth two (2) times a day.  Tanda Ada HERO, MD    Active  yes      sodium bicarbonate 650 mg tablet  Take 650 mg by mouth two (2) times a day.  Provider, Historical    Active                                             *Visitor Policy-One visitor per patient, must be 72 years of age or older, must wear a mask. Upon check in, the patient must arrive with their  post procedure transportation home. The post-procedure transporting party must remain within the surgical waiting room for the duration of the procedure. Failure may result in the cancellation of  the procedure*         We want you to have a positive experience at [facility].  If any of these these instructions are not met, it is possible that your surgery will be canceled.   If you have any questions regarding your surgery, please call [facility] at [phone] or your surgeon's office for further assistance.         [facility]      [facility]esthesia       There are many ways to perform anesthesia for surgery.  The 2 techniques are generally classified as General Anesthesia and Regional Anesthesia.  While both techniques are very safe, they are distinctly different.  As with any anesthesia, there are risks,  which may be increased if you already have heart disease, chronic lung conditions, or other serious medical problems.    General anesthesia puts you to sleep for your surgery.  It acts on your brain and nerves, and affects your entire body.  It  may be administered by an injection, or through inhaling medication.  After you are asleep, a breathing tube may be placed in your windpipe to help you breathe during surgery.  General anesthesia may be more appropriate for longer or more involved surgery,   especially if the position you will be in surgery is uncomfortable.  With general anesthesia you may experience a sore throat and hoarse voice for a few days, headache, nausea/vomiting, drowsiness, or blood pressure and breathing problems.    Regional  anesthesia involves blocking the nerves to a specific area of the body with local anesthesia medication (numbing medicine).  It is usually given in conjunction with varying degrees of twilight sedation.  When at all possible, the recommended type of  anesthesia for both total knee replacement (TKA) and total hip replacement (THA) is a regional anesthesia technique.  This can be achieved utilizing a spinal block technique, or third placement of an epidural catheter.  In addition, your anesthesiologist  may recommend that you have a peripheral nerve block placed to help with pain after the procedure.      -Spinal block-In a spinal block, local anesthetic (i.e. numbing medicine)  is injected into the fluid that baths the spinal cord in the lower part of your back.  This produces a rapid numbing effect that wears off in a few hours.  After meeting the anesthesiologist and discussing her medical conditions and history, the anesthesiologist  may decide to place a long-acting pain medicine as well.  There are some medical conditions and home medications that may preclude a spinal block.    -Epidural block- An epidural block uses a catheter inserted into your lower back to deliver numbing medicine.  The epidural block in the spinal block are administered in a very similar location and technique; however, the epidural catheter is placed in a slightly different  area around the spine as compared to a spinal block.    -Peripheral nerve block (PNB)-For TKA, the anesthesiologist may discuss performing  a PNB.  This technique places local anesthetic directly around the major nerves in your thigh, and one or multiple locations.  These blocks numb only the leg that is injected, and  do not affect the other leg.  PNB's are used in addition to another anesthesia  technique, and are for postoperative pain relief only.      The advantages of regional anesthesia for joint replacement surgery are considered to be significant.  There is a significant reduction in blood loss and blood transfusions, less nausea/vomiting,  less drowsiness, improved pain control after surgery, better diabetes control, and less association with infection.  Additionally, there also are reduced risks (as compared to general anesthesia techniques) with serious medical complications such as heart  attack, stroke, pneumonia, respiratory depression, or development of a blood clot in your legs that can go to your lungs.  Like with any technique, there are risks.  With regional anesthesia, there is a low incidence of headache and nerve damage.  Most  commonly though, if the regional technique proves challenging, it is a difficulty in getting the numbing medicine in close proximity to the nerves perform the nerve block.  Extremely rarely (1 in 200,000), there can be a collection of blood from around  the nerves.  Some patients may experience difficulty voiding (passing urine) for a period of time after a regional technique.

## 2020-12-12 NOTE — Interval H&P Note (Signed)
Chart to Amy clearance nurse for PCP clearance nurse to be done on 12/15/20    Lab and ekg results

## 2020-12-15 ENCOUNTER — Encounter

## 2020-12-20 ENCOUNTER — Inpatient Hospital Stay: Payer: MEDICARE

## 2020-12-20 LAB — ANTIBODY SCREEN
Antibody Screen: NEGATIVE
Antibody screen: NEGATIVE

## 2020-12-20 LAB — BLOOD TYPE, (ABO+RH)
ABO/Rh(D): O POS
ABO/Rh: O POS

## 2020-12-20 MED ORDER — FLUMAZENIL 0.1 MG/ML IV SOLN
0.1 mg/mL | INTRAVENOUS | Status: DC | PRN
Start: 2020-12-20 — End: 2020-12-20

## 2020-12-20 MED ORDER — HYDROMORPHONE 1 MG/ML INJECTION SOLUTION
1 mg/mL | INTRAMUSCULAR | Status: DC | PRN
Start: 2020-12-20 — End: 2020-12-20
  Administered 2020-12-20: 14:00:00 via INTRAVENOUS

## 2020-12-20 MED ORDER — FENTANYL CITRATE (PF) 50 MCG/ML IJ SOLN
50 mcg/mL | INTRAMUSCULAR | Status: AC
Start: 2020-12-20 — End: ?

## 2020-12-20 MED ORDER — MAGNESIUM SULFATE 2 GRAM/50 ML IVPB
2 gram/50 mL (4 %) | INTRAVENOUS | Status: DC | PRN
Start: 2020-12-20 — End: 2020-12-20
  Administered 2020-12-20: 12:00:00 via INTRAVENOUS

## 2020-12-20 MED ORDER — BUPIVACAINE (PF) 0.25 % (2.5 MG/ML) IJ SOLN
0.25 % (2.5 mg/mL) | INTRAMUSCULAR | Status: DC | PRN
Start: 2020-12-20 — End: 2020-12-20
  Administered 2020-12-20: 12:00:00

## 2020-12-20 MED ORDER — LIDOCAINE HCL 1 % (10 MG/ML) IJ SOLN
10 mg/mL (1 %) | Freq: Once | INTRAMUSCULAR | Status: AC | PRN
Start: 2020-12-20 — End: 2020-12-20
  Administered 2020-12-20: 11:00:00 via INTRADERMAL

## 2020-12-20 MED ORDER — NALOXONE 0.4 MG/ML INJECTION
0.4 mg/mL | INTRAMUSCULAR | Status: DC | PRN
Start: 2020-12-20 — End: 2020-12-20

## 2020-12-20 MED ORDER — LACTATED RINGERS IV
INTRAVENOUS | Status: DC
Start: 2020-12-20 — End: 2020-12-20
  Administered 2020-12-20: 11:00:00 via INTRAVENOUS

## 2020-12-20 MED ORDER — ONDANSETRON (PF) 4 MG/2 ML INJECTION
4 mg/2 mL | Freq: Four times a day (QID) | INTRAMUSCULAR | Status: DC | PRN
Start: 2020-12-20 — End: 2020-12-20

## 2020-12-20 MED ORDER — SODIUM CHLORIDE 0.9 % IJ SYRG
INTRAMUSCULAR | Status: DC | PRN
Start: 2020-12-20 — End: 2020-12-20

## 2020-12-20 MED ORDER — PROPOFOL 10 MG/ML IV EMUL
10 mg/mL | INTRAVENOUS | Status: DC | PRN
Start: 2020-12-20 — End: 2020-12-20
  Administered 2020-12-20 (×2): via INTRAVENOUS

## 2020-12-20 MED ORDER — LABETALOL 5 MG/ML IV SOLN
5 mg/mL | INTRAVENOUS | Status: DC | PRN
Start: 2020-12-20 — End: 2020-12-20

## 2020-12-20 MED ORDER — ONDANSETRON (PF) 4 MG/2 ML INJECTION
4 mg/2 mL | INTRAMUSCULAR | Status: DC | PRN
Start: 2020-12-20 — End: 2020-12-20
  Administered 2020-12-20: 13:00:00 via INTRAVENOUS

## 2020-12-20 MED ORDER — CEFAZOLIN 2 GRAM SOLUTION FOR INJECTION
2 gram | Freq: Once | INTRAMUSCULAR | Status: AC
Start: 2020-12-20 — End: 2020-12-20
  Administered 2020-12-20: 12:00:00 via INTRAVENOUS

## 2020-12-20 MED ORDER — DEXAMETHASONE SODIUM PHOSPHATE 4 MG/ML IJ SOLN
4 mg/mL | INTRAMUSCULAR | Status: DC | PRN
Start: 2020-12-20 — End: 2020-12-20
  Administered 2020-12-20: 12:00:00 via INTRAVENOUS

## 2020-12-20 MED ORDER — ACETAMINOPHEN 325 MG TABLET
325 mg | Freq: Once | ORAL | Status: AC
Start: 2020-12-20 — End: 2020-12-20
  Administered 2020-12-20: 11:00:00 via ORAL

## 2020-12-20 MED ORDER — PHENYLEPHRINE 10 MG/ML INJECTION
10 mg/mL | INTRAMUSCULAR | Status: DC | PRN
Start: 2020-12-20 — End: 2020-12-20
  Administered 2020-12-20 (×2): via INTRAVENOUS

## 2020-12-20 MED ORDER — LIDOCAINE HCL 1 % (10 MG/ML) IJ SOLN
10 mg/mL (1 %) | INTRAMUSCULAR | Status: DC | PRN
Start: 2020-12-20 — End: 2020-12-20
  Administered 2020-12-20: 12:00:00 via INTRAVENOUS

## 2020-12-20 MED ORDER — MIDAZOLAM 1 MG/ML IJ SOLN
1 mg/mL | INTRAMUSCULAR | Status: DC | PRN
Start: 2020-12-20 — End: 2020-12-20
  Administered 2020-12-20: 12:00:00 via INTRAVENOUS

## 2020-12-20 MED ORDER — IBUPROFEN 800 MG TAB
800 mg | ORAL_TABLET | Freq: Three times a day (TID) | ORAL | 1 refills | Status: AC | PRN
Start: 2020-12-20 — End: ?

## 2020-12-20 MED ORDER — OCTYL 2-CYANOACRYLATE TOPICAL LIQUID
CUTANEOUS | Status: AC
Start: 2020-12-20 — End: ?

## 2020-12-20 MED ORDER — ROCURONIUM 10 MG/ML IV
10 mg/mL | INTRAVENOUS | Status: DC | PRN
Start: 2020-12-20 — End: 2020-12-20
  Administered 2020-12-20 (×2): via INTRAVENOUS

## 2020-12-20 MED ORDER — HYDRALAZINE 20 MG/ML IJ SOLN
20 mg/mL | INTRAMUSCULAR | Status: DC | PRN
Start: 2020-12-20 — End: 2020-12-20
  Administered 2020-12-20 (×3): via INTRAVENOUS

## 2020-12-20 MED ORDER — LACTATED RINGERS IV
INTRAVENOUS | Status: DC | PRN
Start: 2020-12-20 — End: 2020-12-20
  Administered 2020-12-20: 12:00:00 via INTRAVENOUS

## 2020-12-20 MED ORDER — FENTANYL CITRATE (PF) 50 MCG/ML IJ SOLN
50 mcg/mL | INTRAMUSCULAR | Status: DC | PRN
Start: 2020-12-20 — End: 2020-12-20
  Administered 2020-12-20 (×4): via INTRAVENOUS

## 2020-12-20 MED ORDER — FENTANYL CITRATE (PF) 50 MCG/ML IJ SOLN
50 mcg/mL | INTRAMUSCULAR | Status: DC | PRN
Start: 2020-12-20 — End: 2020-12-20
  Administered 2020-12-20 (×2): via INTRAVENOUS

## 2020-12-20 MED ORDER — MAGNESIUM SULFATE 2 GRAM/50 ML IVPB
2 gram/50 mL (4 %) | INTRAVENOUS | Status: AC
Start: 2020-12-20 — End: ?

## 2020-12-20 MED ORDER — MIDAZOLAM 1 MG/ML IJ SOLN
1 mg/mL | INTRAMUSCULAR | Status: AC
Start: 2020-12-20 — End: ?

## 2020-12-20 MED ORDER — BUPIVACAINE (PF) 0.25 % (2.5 MG/ML) IJ SOLN
0.25 % (2.5 mg/mL) | INTRAMUSCULAR | Status: AC
Start: 2020-12-20 — End: ?

## 2020-12-20 MED ORDER — HYDRALAZINE 20 MG/ML IJ SOLN
20 mg/mL | INTRAMUSCULAR | Status: DC
Start: 2020-12-20 — End: 2020-12-20

## 2020-12-20 MED ORDER — SUGAMMADEX 100 MG/ML INTRAVENOUS SOLUTION
100 mg/mL | INTRAVENOUS | Status: DC | PRN
Start: 2020-12-20 — End: 2020-12-20
  Administered 2020-12-20: 13:00:00 via INTRAVENOUS

## 2020-12-20 MED ORDER — ONDANSETRON (PF) 4 MG/2 ML INJECTION
4 mg/2 mL | Freq: Once | INTRAMUSCULAR | Status: DC | PRN
Start: 2020-12-20 — End: 2020-12-20

## 2020-12-20 MED FILL — LACTATED RINGERS IV: INTRAVENOUS | Qty: 1000

## 2020-12-20 MED FILL — MAGNESIUM SULFATE 2 GRAM/50 ML IVPB: 2 gram/50 mL (4 %) | INTRAVENOUS | Qty: 50

## 2020-12-20 MED FILL — CEFAZOLIN 2 GRAM SOLUTION FOR INJECTION: 2 gram | INTRAMUSCULAR | Qty: 2000

## 2020-12-20 MED FILL — TYLENOL 325 MG TABLET: 325 mg | ORAL | Qty: 3

## 2020-12-20 MED FILL — FENTANYL CITRATE (PF) 50 MCG/ML IJ SOLN: 50 mcg/mL | INTRAMUSCULAR | Qty: 2

## 2020-12-20 MED FILL — SENSORCAINE-MPF 0.25 % (2.5 MG/ML) INJECTION SOLUTION: 0.25 % (2.5 mg/mL) | INTRAMUSCULAR | Qty: 30

## 2020-12-20 MED FILL — OCTYL 2-CYANOACRYLATE TOPICAL LIQUID: CUTANEOUS | Qty: 1

## 2020-12-20 MED FILL — HYDRALAZINE 20 MG/ML IJ SOLN: 20 mg/mL | INTRAMUSCULAR | Qty: 1

## 2020-12-20 MED FILL — HYDROMORPHONE (PF) 1 MG/ML IJ SOLN: 1 mg/mL | INTRAMUSCULAR | Qty: 1

## 2020-12-20 MED FILL — MIDAZOLAM 1 MG/ML IJ SOLN: 1 mg/mL | INTRAMUSCULAR | Qty: 2

## 2020-12-20 NOTE — Procedures (Signed)
Procedures  by Brantley Fling, MD at 12/20/20 [phone]                Author: Brantley Fling, MD  Service: Obstetrics & Gynecology  Author Type: Physician       Filed: 12/20/20 0921  Date of Service: 12/20/20 0919  Status: Signed          Editor: Brantley Fling, MD (Physician)                        Operative Report          Patient: Yvonne Barton  MRN: [id number]   SSN: [id number]          Date of Birth: 1955/10/30   Age: 65 y.o.   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  OPERATION PERFORMED:  1.  Robotic hysterectomy with bilateral salpingo-oophorectomy (BSO).     INDICATIONS:  Yvonne Barton is a 65 y.o. old female admitted for surgery with a history of:      Patient Active Problem List           Diagnosis  Date Noted         ?  PMB (postmenopausal bleeding)  01/11/2020     ?  Thickened endometrium  01/11/2020     ?  Chronic migraine  05/09/2018     ?  Essential hypertension  05/09/2018     ?  Prediabetes  05/09/2018     ?  Urinary incontinence  05/09/2018     ?  Vitamin D deficiency  05/09/2018     ?  Class 2 obesity  05/09/2018         ?  Seasonal allergies  05/09/2018     .  Recommendations were made for a definitive surgical therapy.  The patient was advised of potential risks and complications and written  consent was obtained.     ANESTHESIA: General.      COMPLICATIONS: none      EBL: * No values recorded between 12/20/2020  8:05 AM and 12/20/2020  9:20 AM *    FINDINGS:  globular uterus, hydrosalpinx right tube  omental adhesions to the anterior abdominal wall     DESCRIPTION OF SURGERY:   TECHNIQUE: After  the patient was identified in the operating room, she was placed under general    anesthesia by the anesthesia team. She was sterilely prepped and draped in the modified lithotomy    position. The  cervix and uterus were secured with a VCare.  Foley catheter was placed under sterile conditions.  An incision was made in the midline of   the upper abdomen and an 8 mm Optiview trocar was inserted. A pneumoperitoneum was created. The    8 mm robotic ports were placed under direct vision in the right and left upper abdomen, and an accessory    port was placed in the right lower quadrant. Omental adhesions were dissected from the anterior abdominal wall. The patient was placed in Trendelenburg position and the robot    was docked.  The retroperitoneal spaces were developed.  The ureters were identified. The infundibulopelvic ligaments were skeletonized, coagulated, and  divided. The broad ligaments were divided with electrocautery. The round ligaments were coagulated and    divided. The bladder was sharply dissected from the cervix and upper vagina. The uterine vessels were    skeletonized, thoroughly coagulated, and divided. The cardinal ligaments were divided with electrocautery    and electrocautery was used to circumscribe the colpotomizer cup. The specimen was removed through   the vagina and sent to pathology with the findings as listed above. The vaginal apex was closed with running   2-0 V-Loc. The pelvis was copiously irrigated and hemostasis was noted. The robotic instruments were removed under direct vision and the robot was undocked.    The pneumoperitoneum was deflated, and the ports were removed. The skin incisions were closed with    subcuticular 4-0 Monocryl and Dermabond was applied.  The Foley catheter was removed.  The patient was then awakened, extubated, and transported    to the recovery room in stable condition. All needle, sponge, and instrument counts were noted to be correct at the end   of the case.    ______________________________  Electronically signed:                   Brantley Fling, MD                   December 20, 2020                   9:20 AM

## 2020-12-20 NOTE — H&P (Signed)
H&P by Brantley Fling, MD at 12/20/20 [phone]                Author: Brantley Fling, MD  Service: Obstetrics & Gynecology  Author Type: Physician       Filed: 12/20/20 0719  Date of Service: 12/20/20 0718  Status: Signed          Editor: Brantley Fling, MD (Physician)                         Gynecologic Oncology History and Physical          Patient: Yvonne Barton  MRN: [id number]   SSN: [id number]          Date of Birth: 11-16-1955   Age: 65 y.o.   Sex: female                                            Date: December 20, 2020      Chief Complaint:   Complex hyperplasia          Assessment/Plan     Complex hyperplasia    Adnexal mass    RA LH/BSO   Risks benefits discussed      History of Present Illness     65 y.o.  BLACK/[place] presents for surgery.            Past Medical/Surgical History          Past Medical History:        Diagnosis  Date         ?  Anemia            many years ago         ?  Chronic kidney disease            stage 3         ?  Complex endometrial hyperplasia       ?  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  Gout       ?  Hypercholesteremia       ?  Hypertension       ?  Kidney cysts       ?  Kidney stones       ?  Migraines            not in 7 years         ?  Obesity       ?  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  HX COLONOSCOPY         ?  HX DILATION AND CURETTAGE         ?  HX ENDOSCOPY              "several times"          ?  HX TONSILLECTOMY    age 74          ?  PR LAP  GASTRIC BYPASS/ROUX-EN-Y    2005             Social History          Social History          Socioeconomic History         ?  Marital status:  MARRIED              Spouse name:  Not on file         ?  Number of children:  Not on file     ?  Years of education:  Not on file     ?  Highest education level:  Not on file       Occupational History        ?  Not on file        Tobacco Use         ?  Smoking status:  Former              Packs/day:  1.00         Types:  Cigarettes         ?  Smokeless tobacco:  Never        ?  Tobacco comments:             30-40 years       Substance and Sexual Activity         ?  Alcohol use:  Yes             Comment: wine during the holidays         ?  Drug use:  Never     ?  Sexual activity:  Not on file        Other Topics  Concern        ?  Not on file       Social History Narrative        ?  Not on file          Social Determinants of Health          Financial Resource Strain: Not on file     Food Insecurity: Not on file     Transportation Needs: Not on file     Physical Activity: Not on file     Stress: Not on file     Social Connections: Not on file     Intimate Partner Violence: Not on file       Housing Stability: Not on file          Social History          Substance and Sexual Activity        Drug Use  Never          Social History          Substance and Sexual Activity        Drug Use  Never          Social History          Tobacco Use        Smoking Status  Former         ?  Packs/day:  1.00     ?  Types:  Cigarettes        Smokeless Tobacco  Never       Tobacco Comments          30-40 years          Social History  Substance and Sexual Activity        Sexual Activity  Not on file             Family History          Family History         Problem  Relation  Age of Onset          ?  Cancer  Mother       ?  Dementia  Mother       ?  Heart Failure  Mother            ?  Stroke  Father               Current Medications          Prior to Admission Medications     Prescriptions  Last Dose  Informant  Patient Reported?  Taking?      B.animalis,bifid,infantis,long 10-15 mg TbEC  Not Taking    Yes  No      Sig: Take  by mouth daily. Phillips probiotic, unsure of dose      Patient not taking: Reported on 12/20/2020      acetaminophen (TYLENOL) 650 mg TbER  12/06/2020    Yes  No      Sig: Take 650 mg by mouth as needed.      allopurinoL (ZYLOPRIM)  100 mg tablet  12/19/2020    Yes  Yes      Sig: Take  by mouth as needed. am      aspirin delayed-release 81 mg tablet  12/09/2020    Yes  No      Sig: Take 81 mg by mouth daily.      atorvastatin (LIPITOR) 40 mg tablet  12/19/2020    No  Yes      Sig: TAKE 1 TABLET BY MOUTH EVERY DAY      cholecalciferol, vitamin D3, 50 mcg (2,000 unit) tab  12/19/2020    Yes  Yes      Sig: Take 1 Tablet by mouth daily.      cloNIDine HCL (CATAPRES) 0.1 mg tablet  12/19/2020    Yes  Yes      Sig: Take 0.1 mg by mouth two (2) times a day.      ibuprofen (MOTRIN) 800 mg tablet  Not Taking    No  No      Sig: Take 1 Tablet by mouth every eight (8) hours as needed for Pain. Indications: pain      Patient not taking: Reported on 12/20/2020      losartan (COZAAR) 100 mg tablet  12/19/2020    Yes  Yes      Sig: Take 100 mg by mouth daily.      metoprolol tartrate (LOPRESSOR) 25 mg tablet  0300    No  No      Sig: Take 0.5 Tablets by mouth two (2) times a day.      sodium bicarbonate 650 mg tablet  12/19/2020    Yes  Yes      Sig: Take 650 mg by mouth two (2) times a day.               Facility-Administered Medications: None             Allergies          Allergies        Allergen  Reactions         ?  Latex, Natural Rubber  Rash     ?  Percocet [Oxycodone-Acetaminophen]  Anaphylaxis         ?  Lactose  Diarrhea          Review of Systems     Constitutional: No fever, chills, anorexia, weight loss, weakness, or sleep disturbance.   Skin/Breasts: No breast pain, lumps, nipple discharge, or axillary lumps.  The patient notes no     rash or skin irritation systemically.   Cardiovascular: No chest pain,palpitations,syncope, or claudication   Respiratory: No cough, shortness of breath, hemotysis, or orthopnea   Gastointestinal: No nausea, vomiting, frequency or caliber of stools, blood in the stools or diarrhea.   Genitourinary: No urinary frequency, dysuria,hematuria, or history of stones.   Endo: No cold intolerance,excessive fatigue,or sleep disturbance.    Heme/Lymph:    Neurological: No numbness or focal weakness.  No tremors. No problems with voiding or defecation.   Psychiatric: No symptoms of depression or anxiety.  No hallucinations or symptoms of psychosis.     Physical Exam     Constitutional: The patient is in no acute distress.  she  is alert and oriented times three.   Eyes: No lesions are present. Pupils are equal. The sclerea are ono-icteric.   Neck: The neck is supple with a normal thyroid.   Heme/Lymph: There is no palpable lymphadenopathy in the cervical supraclavicular, or inguinal regions.   Respiratory: The chest is clear to auscultation and percussion bilaterally.   Cardiovascular: The heart is regular and without murmur.   Abdomen: Soft, nontender, nondistended, NABS, no rebound, no guarding   Genitourinary: Normal external genitalia, normal vaginal mucosa   Skin: Warm and dry   Musculoskeletal: Extremeties are without edema or significant varicosities.        Diagnostic Studies     Lab:    No results found for this or any previous visit (from the past 12 hour(s)).      Imaging:     No results found.             ______________________________  Electronically signed:                   Brantley Fling, MD                   December 20, 2020                   7:18 AM

## 2020-12-20 NOTE — Anesthesia Post-Procedure Evaluation (Signed)
Procedure(s):  DAVINCI XI ASSISTED LAPAROSCOPIC HYSTERECTOMY; BILATERAL SALPINGO OOPHORECTOMY.    general    Anesthesia Post Evaluation      Multimodal analgesia: multimodal analgesia used between 6 hours prior to anesthesia start to PACU discharge  Patient location during evaluation: PACU  Patient participation: complete - patient participated  Level of consciousness: awake and alert  Pain management: satisfactory to patient  Airway patency: patent  Anesthetic complications: no  Cardiovascular status: acceptable and stable  Respiratory status: acceptable  Hydration status: acceptable  Post anesthesia nausea and vomiting:  none  Final Post Anesthesia Temperature Assessment:  Normothermia (36.0-37.5 degrees C)      INITIAL Post-op Vital signs:   Vitals Value Taken Time   BP 145/85 12/20/20 1117   Temp 36.2 ??C (97.2 ??F) 12/20/20 0933   Pulse 76 12/20/20 1112   Resp 18 12/20/20 1112   SpO2 93 % 12/20/20 1117   Vitals shown include unvalidated device data.

## 2020-12-28 ENCOUNTER — Encounter: Attending: Nurse Practitioner | Primary: Family Medicine

## 2021-01-27 ENCOUNTER — Inpatient Hospital Stay: Admit: 2021-01-27 | Primary: Family Medicine

## 2021-01-27 LAB — LABCORP SPECIMEN COLLECTION

## 2021-02-13 ENCOUNTER — Inpatient Hospital Stay: Admit: 2021-02-13 | Payer: MEDICARE | Attending: Family Medicine | Primary: Family Medicine

## 2021-02-13 DIAGNOSIS — M810 Age-related osteoporosis without current pathological fracture: Secondary | ICD-10-CM

## 2021-02-13 LAB — HM DEXA SCAN

## 2021-03-28 ENCOUNTER — Encounter

## 2021-04-21 ENCOUNTER — Ambulatory Visit: Admit: 2021-04-21 | Discharge: 2021-04-21 | Payer: MEDICARE | Attending: Specialist | Primary: Family Medicine

## 2021-04-21 DIAGNOSIS — I2510 Atherosclerotic heart disease of native coronary artery without angina pectoris: Secondary | ICD-10-CM

## 2021-04-21 MED ORDER — METOPROLOL TARTRATE 50 MG TAB
50 mg | ORAL_TABLET | Freq: Two times a day (BID) | ORAL | 3 refills | Status: AC
Start: 2021-04-21 — End: ?

## 2021-04-21 NOTE — Progress Notes (Signed)
1. Have you been to the ER, urgent care clinic since your last visit?  Hospitalized since your last visit?     Yes When: 12/2020 Where: [facility]  Reason for visit: hysterectomy  2. Have you seen or consulted any other health care providers outside of the [facility] since your last visit?  Include any pap smears or colon screening.      No

## 2021-04-21 NOTE — Progress Notes (Signed)
HISTORY OF PRESENT ILLNESS  Yvonne Barton is a 65 y.o. female.    08/25/2020  Patient seen today for new patient evaluation.  She is here for evaluation of chest pain.  She was in emergency room recently with complaint of chest tightness going on for about 10 days.  She has been having episode of chest tightness.  Also has been feeling palpitation.  These are progressive.  She is short of breath and only heavy exertion.  No edema.  No orthopnea she does notice some breathing disturbance during sleep according to her spouse.      Review of Systems   Constitutional:  Negative for chills and fever.   HENT:  Negative for nosebleeds.    Eyes:  Negative for blurred vision and double vision.   Respiratory:  Negative for cough, hemoptysis, sputum production, shortness of breath and wheezing.    Cardiovascular:  Positive for chest pain and palpitations. Negative for orthopnea, claudication, leg swelling and PND.   Gastrointestinal:  Negative for abdominal pain, heartburn, nausea and vomiting.   Musculoskeletal:  Negative for myalgias.   Skin:  Negative for rash.   Neurological:  Negative for dizziness, weakness and headaches.   Endo/Heme/Allergies:  Does not bruise/bleed easily. 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    Gout     Hypercholesteremia     Hypertension     Kidney cysts     Kidney stones     Migraines     not in 7 years    Obesity     Sarcoidosis unsure    pt states in remission "for a long time"       Past Surgical History:   Procedure Laterality Date    HX COLONOSCOPY      HX DILATION AND CURETTAGE      HX ENDOSCOPY      "several times"    HX TONSILLECTOMY  age 19    PR LAP GASTRIC BYPASS/ROUX-EN-Y  2005        Social History     Tobacco Use    Smoking status: Former     Packs/day: 1.00     Types: Cigarettes    Smokeless tobacco: Never    Tobacco comments:     30-40 years   Substance Use Topics    Alcohol use: Yes     Comment: wine during the holidays       Allergies   Allergen Reactions    Latex, Natural Rubber Rash    Percocet [Oxycodone-Acetaminophen] Anaphylaxis    Lactose Diarrhea       Prior to Admission medications    Medication Sig Start Date End Date Taking? Authorizing Provider   FOLIC ACID PO Take 540 mcg by mouth daily.   Yes Provider, Historical   metoprolol tartrate (LOPRESSOR) 50 mg tablet Take 1 Tablet by mouth two (2) times a day. 04/21/21  Yes Tona Sensing, MD   ibuprofen (MOTRIN) 800 mg tablet Take 1 Tablet by mouth  every eight (8) hours as needed for Pain. 12/20/20  Yes Brantley Fling, MD   atorvastatin (LIPITOR) 40 mg tablet TAKE 1 TABLET BY MOUTH EVERY DAY 12/12/20  Yes Tona Sensing, MD   aspirin delayed-release 81 mg tablet Take 81 mg by mouth daily.   Yes Provider, Historical   cholecalciferol, vitamin D3, 50 mcg (2,000 unit) tab Take 1 Tablet by mouth daily. 05/25/20  Yes Provider, Historical   losartan (COZAAR) 100 mg tablet Take 100 mg by mouth daily. 06/14/20 06/14/21 Yes Provider, Historical   sodium bicarbonate 650 mg tablet Take 650 mg by mouth two (2) times a day. 05/25/20  Yes Provider, Historical   allopurinoL (ZYLOPRIM) 100 mg tablet Take  by mouth as needed. am   Yes Provider, Historical   cloNIDine HCL (CATAPRES) 0.1 mg tablet Take 0.1 mg by mouth two (2) times a day.   Yes Provider, Historical   acetaminophen (TYLENOL) 650 mg TbER Take 650 mg by mouth as needed.   Yes Provider, Historical         Visit Vitals  BP (!) 159/113   Pulse 95   Ht 5\' 4"  (1.626 m)   Wt 107.5 kg (237 lb)   SpO2 98%   BMI 40.68 kg/m??       Physical Exam  Constitutional:       Appearance: She is well-developed.   HENT:      Head: Normocephalic and atraumatic.   Eyes:      Conjunctiva/sclera: Conjunctivae  normal.   Neck:      Thyroid: No thyromegaly.      Vascular: No JVD.      Trachea: No tracheal deviation.   Cardiovascular:      Rate and Rhythm: Normal rate and regular rhythm.      Chest Wall: PMI is not displaced.      Pulses: No decreased pulses.      Heart sounds: No murmur heard.    No gallop. No S3 sounds.   Pulmonary:      Effort: No respiratory distress.      Breath sounds: No wheezing or rales.   Chest:      Chest wall: No tenderness.   Abdominal:      Palpations: Abdomen is soft.      Tenderness: There is no abdominal tenderness.   Musculoskeletal:      Cervical back: Neck supple.   Skin:     General: Skin is warm.   Neurological:      Mental Status: She is alert and oriented to person, place, and time.       Ms. Catalina has a reminder for a "due or due soon" health maintenance. I have asked that she contact her primary care provider for follow-up on this health maintenance.    No flowsheet data found.  I have personally reviewed patient's records available from hospital and other providers and incorporated findings in patient care.  Provider notes, lab, EKG, chest x-ray  I have personally reviewed patients ekg done at other facility.  Sinus rhythm, LVH, nonspecific ST-T changes  IMPRESSION.  Chest CT for coronary calcium for 2022     Coronary calcium score 159.  IMPRESSION CT 08/2020     Diffuse hepatic steatosis.  Healed granulomatous disease.  Benign noncalcified small node left lower lobe. No imaging follow-up needed.  Coronary and cardiac findings will be described under a separate report  completed by cardiology.     Interpretation Summary 09/2020  Left Ventricle: Normal left ventricular systolic function with a visually estimated EF of 55 - 60%. Left ventricle size is normal. Increased wall thickness. Findings consistent with mild concentric hypertrophy. Normal wall motion. Diastolic dysfunction present with increased LAP with normal LV EF.     Carlyle Dolly, MD ECG Reader, SPECT Reader,  Test Supervisor 09/30/2020       Interpretation Summary 09/2020         Nuclear Findings: Normal left ventricular systolic function post-stress. LVEF measures 66%.    Nuclear Findings: Normal treadmill myocardial perfusion study at 94 %% PHR. Findings suggest a low risk of myocardial ischemia. LVEF measures 66%.    Nuclear Findings: LVEF measures 66%. LV perfusion is normal.    ECG: Resting ECG demonstrates normal sinus rhythm.    ECG: Stress ECG was negative for ischemia.    Stress Test: A Bruce protocol stress test was performed. Overall, the patient's exercise capacity was poor for their age. The patient exercised for 2 min and 57 sec. Hemodynamics are adequate for diagnosis. Blood pressure demonstrated a normal response and heart rate demonstrated a normal response to stress.     09/2020-event monitor  Rare PAC and PVC.  Occasional symptom with PVC  Assessment        ICD-10-CM ICD-9-CM    1. Coronary artery calcification  I25.10 414.00     I25.84 414.4     Stable symptoms continue treatment monitor      2. Primary hypertension  I10 401.9     Elevated blood pressure adjust medicine      3. PVC (premature ventricular contraction)  I49.3 427.69     Stable symptom monitor      4. Chest pain, unspecified type  R07.9 786.50     Stable continue treatment      5. Palpitation  R00.2 785.1     Intermittent increased palpitation increase metoprolol        08/2020  Seen for atypical chest pain and palpitation.  Has history of sarcoidosis in past.  Follow-up after testing.  If needed consider sleep apnea evaluation    09/2020  Work-up reviewed.  Medical management.  Follow-up lab after statin treatment continue aspirin  04/2021  Recent increased palpitation blood pressure elevated increase metoprolol to 50 mg twice a day continue other treatment.  Overall has weight loss, continue dietary modification and exercise    Medications Discontinued During This Encounter   Medication Reason    ibuprofen (MOTRIN) 800 mg tablet Not A  Current Medication    B.animalis,bifid,infantis,long 10-15 mg TbEC Not A Current Medication    metoprolol tartrate (LOPRESSOR) 25 mg tablet        Orders Placed This Encounter    metoprolol tartrate (LOPRESSOR) 50 mg tablet     Sig: Take 1 Tablet by mouth two (2) times a day.     Dispense:  180 Tablet     Refill:  3       Follow-up and Dispositions    Return in about 6 months (around 10/20/2021).

## 2021-05-22 ENCOUNTER — Inpatient Hospital Stay: Admit: 2021-05-22 | Payer: MEDICARE | Primary: Family Medicine

## 2021-05-22 ENCOUNTER — Encounter

## 2021-05-22 DIAGNOSIS — I129 Hypertensive chronic kidney disease with stage 1 through stage 4 chronic kidney disease, or unspecified chronic kidney disease: Secondary | ICD-10-CM

## 2021-05-22 LAB — PTH, INTACT
Calcium: 9.3 MG/DL (ref 8.5–10.1)
PTH: 140.4 pg/mL — ABNORMAL HIGH (ref 18.4–88.0)

## 2021-05-22 LAB — URINALYSIS WITH MICROSCOPIC
Bilirubin, Urine: NEGATIVE
Blood, Urine: NEGATIVE
Glucose, Ur: NEGATIVE mg/dL
Hyaline Casts, UA: 0 /lpf (ref 0–2)
Nitrite, Urine: POSITIVE — AB
Protein, UA: NEGATIVE mg/dL
RBC, UA: 0 /hpf (ref 0–5)
Specific Gravity, UA: 1.023 (ref 1.005–1.030)
Urobilinogen, UA, POCT: 1 EU/dL (ref 0.2–1.0)
WBC, UA: 36 /hpf (ref 0–4)
pH, UA: 5.5 (ref 5.0–8.0)

## 2021-05-22 LAB — MICROALBUMIN / CREATININE URINE RATIO
Creatinine, Ur: 287 mg/dL — ABNORMAL HIGH (ref 30–125)
Microalb, Ur: 2.22 MG/DL (ref 0–3.0)
Microalbumin Creatinine Ratio: 8 mg/g (ref 0–30)

## 2021-05-22 LAB — VITAMIN D 25 HYDROXY: Vit D, 25-Hydroxy: 27.2 ng/mL — ABNORMAL LOW (ref 30–100)

## 2021-05-22 LAB — VITAMIN D, 25 HYDROXY: Vitamin D 25-Hydroxy: 27.2 ng/mL — ABNORMAL LOW (ref 30–100)

## 2021-05-22 LAB — URINALYSIS W/MICROSCOPIC
Bilirubin: NEGATIVE
Blood: NEGATIVE
Glucose: NEGATIVE mg/dL
Hyaline cast: 0 /lpf (ref 0–2)
Nitrites: POSITIVE — AB
Protein: NEGATIVE mg/dL
RBC: 0 /hpf (ref 0–5)
Specific gravity: 1.023 (ref 1.005–1.030)
Urobilinogen: 1 EU/dL (ref 0.2–1.0)
WBC: 36 /hpf (ref 0–4)
pH (UA): 5.5 (ref 5.0–8.0)

## 2021-05-22 LAB — MICROALBUMIN, UR, RAND W/ MICROALB/CREAT RATIO
Creatinine, urine random: 287 mg/dL — ABNORMAL HIGH (ref 30–125)
Microalbumin,urine random: 2.22 MG/DL (ref 0–3.0)
Microalbumin/Creat ratio (mg/g creat): 8 mg/g (ref 0–30)

## 2021-05-22 LAB — PTH INTACT
Calcium: 9.3 MG/DL (ref 8.5–10.1)
PTH, Intact: 140.4 pg/mL — ABNORMAL HIGH (ref 18.4–88.0)

## 2021-05-23 LAB — RENAL FUNCTION PANEL
Albumin: 3.3 g/dL — ABNORMAL LOW (ref 3.4–5.0)
Albumin: 3.3 g/dL — ABNORMAL LOW (ref 3.4–5.0)
Anion Gap: 8 mmol/L (ref 3.0–18)
Anion gap: 8 mmol/L (ref 3.0–18)
BUN/Creatinine ratio: 12 (ref 12–20)
BUN: 15 MG/DL (ref 7.0–18)
BUN: 15 MG/DL (ref 7.0–18)
Bun/Cre Ratio: 12 (ref 12–20)
CO2: 25 mmol/L (ref 21–32)
CO2: 25 mmol/L (ref 21–32)
Calcium: 8.8 MG/DL (ref 8.5–10.1)
Calcium: 8.8 MG/DL (ref 8.5–10.1)
Chloride: 109 mmol/L (ref 100–111)
Chloride: 109 mmol/L (ref 100–111)
Creatinine: 1.29 MG/DL (ref 0.6–1.3)
Creatinine: 1.29 MG/DL (ref 0.6–1.3)
ESTIMATED GLOMERULAR FILTRATION RATE: 46 mL/min/{1.73_m2} — ABNORMAL LOW (ref >60)
Glucose: 94 mg/dL (ref 74–99)
Glucose: 94 mg/dL (ref 74–99)
Phosphorus: 3.6 MG/DL (ref 2.5–4.9)
Phosphorus: 3.6 MG/DL (ref 2.5–4.9)
Potassium: 4.3 mmol/L (ref 3.5–5.5)
Potassium: 4.3 mmol/L (ref 3.5–5.5)
Sodium: 142 mmol/L (ref 136–145)
Sodium: 142 mmol/L (ref 136–145)
eGFR: 46 mL/min/{1.73_m2} — ABNORMAL LOW (ref >60)

## 2021-05-23 LAB — URIC ACID
Uric Acid, Serum: 6.5 MG/DL (ref 2.6–7.2)
Uric acid: 6.5 MG/DL (ref 2.6–7.2)

## 2021-06-08 ENCOUNTER — Ambulatory Visit: Payer: MEDICARE | Primary: Family Medicine

## 2021-06-09 ENCOUNTER — Encounter

## 2021-06-09 MED ORDER — ATORVASTATIN 40 MG TAB
40 mg | ORAL_TABLET | ORAL | 1 refills | Status: AC
Start: 2021-06-09 — End: ?

## 2021-06-12 ENCOUNTER — Encounter

## 2021-06-13 ENCOUNTER — Encounter

## 2021-06-16 ENCOUNTER — Encounter

## 2021-06-17 ENCOUNTER — Encounter

## 2021-07-03 ENCOUNTER — Encounter

## 2021-07-04 ENCOUNTER — Inpatient Hospital Stay: Admit: 2021-07-04 | Payer: MEDICARE | Primary: Family Medicine

## 2021-07-04 DIAGNOSIS — Z1231 Encounter for screening mammogram for malignant neoplasm of breast: Secondary | ICD-10-CM

## 2021-07-05 ENCOUNTER — Encounter

## 2021-11-02 ENCOUNTER — Ambulatory Visit: Admit: 2021-11-02 | Discharge: 2021-11-02 | Payer: MEDICARE | Attending: Specialist | Primary: Family Medicine

## 2021-11-02 DIAGNOSIS — I2510 Atherosclerotic heart disease of native coronary artery without angina pectoris: Secondary | ICD-10-CM

## 2021-11-02 NOTE — Progress Notes (Signed)
HISTORY OF PRESENT ILLNESS  Yvonne Barton is a 66 y.o. female.    08/25/2020  Patient seen today for new patient evaluation.  She is here for evaluation of chest pain.  She was in emergency room recently with complaint of chest tightness going on for about 10 days.  She has been having episode of chest tightness.  Also has been feeling palpitation.  These are progressive.  She is short of breath and only heavy exertion.  No edema.  No orthopnea she does notice some breathing disturbance during sleep according to her spouse.      Review of Systems   Constitutional:  Negative for chills and fever.   HENT:  Negative for nosebleeds.    Eyes:  Negative for blurred vision and double vision.   Respiratory:  Negative for cough, hemoptysis, sputum production, shortness of breath and wheezing.    Cardiovascular:  Positive for chest pain and palpitations. Negative for orthopnea, claudication, leg swelling and PND.   Gastrointestinal:  Negative for abdominal pain, heartburn, nausea and vomiting.   Musculoskeletal:  Negative for myalgias.   Skin:  Negative for rash.   Neurological:  Negative for dizziness, weakness and headaches.   Endo/Heme/Allergies:  Does not bruise/bleed easily. 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    Gout     Hypercholesteremia     Hypertension     Kidney cysts     Kidney stones     Migraines     not in 7 years    Obesity     Sarcoidosis unsure    pt states in remission "for a long time"       Past Surgical History:   Procedure Laterality Date    COLONOSCOPY      DILATION AND CURETTAGE OF UTERUS      LAP GASTRIC BYPASS/ROUX-EN-Y  2005    TONSILLECTOMY  age 59    UPPER GASTROINTESTINAL ENDOSCOPY      "several  times"       Social History     Tobacco Use    Smoking status: Former     Packs/day: 1.00     Types: Cigarettes    Smokeless tobacco: Never   Substance Use Topics    Alcohol use: Yes       Allergies   Allergen Reactions    Latex Rash    Oxycodone-Acetaminophen Anaphylaxis    Lactose Diarrhea       Prior to Admission medications    Medication Sig Start Date End Date Taking? Authorizing Provider   FOLIC ACID PO Take 962 mcg by mouth daily   Yes Ar Automatic Reconciliation   acetaminophen (TYLENOL) 650 MG extended release tablet Take 1 tablet by mouth as needed   Yes Ar Automatic Reconciliation   allopurinol (ZYLOPRIM) 100 MG tablet Take by mouth as needed   Yes Ar Automatic Reconciliation   aspirin 81 MG EC tablet Take 1 tablet by mouth daily   Yes Ar Automatic Reconciliation  atorvastatin (LIPITOR) 40 MG tablet TAKE 1 TABLET BY MOUTH EVERY DAY 12/12/20  Yes Ar Automatic Reconciliation   Cholecalciferol 50 MCG (2000 UT) TABS Take 1 tablet by mouth daily 05/25/20  Yes Ar Automatic Reconciliation   cloNIDine (CATAPRES) 0.1 MG tablet Take 1 tablet by mouth 2 times daily   Yes Ar Automatic Reconciliation   ibuprofen (ADVIL;MOTRIN) 800 MG tablet Take 1 tablet by mouth every 8 hours as needed 12/20/20  Yes Ar Automatic Reconciliation   losartan (COZAAR) 100 MG tablet Take 1 tablet by mouth daily 06/14/20 11/02/21 Yes Ar Automatic Reconciliation   metoprolol tartrate (LOPRESSOR) 50 MG tablet Take 1 tablet by mouth 2 times daily 04/21/21  Yes Ar Automatic Reconciliation   sodium bicarbonate 650 MG tablet Take 1 tablet by mouth 2 times daily 05/25/20  Yes Ar Automatic Reconciliation         Ht '5\' 4"'$  (1.626 m)   Wt 223 lb (101.2 kg)   BMI 38.28 kg/m   Physical Exam  Constitutional:       Appearance: She is well-developed.   HENT:      Head: Normocephalic and atraumatic.   Eyes:      Conjunctiva/sclera: Conjunctivae normal.   Neck:      Thyroid: No thyromegaly.      Vascular: No JVD.      Trachea: No tracheal deviation.    Cardiovascular:      Rate and Rhythm: Normal rate and regular rhythm.      Chest Wall: PMI is not displaced.      Pulses: No decreased pulses.      Heart sounds: No murmur heard.    No gallop. No S3 sounds.   Pulmonary:      Effort: No respiratory distress.      Breath sounds: No wheezing or rales.   Chest:      Chest wall: No tenderness.   Abdominal:      Palpations: Abdomen is soft.      Tenderness: There is no abdominal tenderness.   Musculoskeletal:      Cervical back: Neck supple.   Skin:     General: Skin is warm.   Neurological:      Mental Status: She is alert and oriented to person, place, and time.       Ms. Sarin has a reminder for a "due or due soon" health maintenance. I have asked that she contact her primary care provider for follow-up on this health maintenance.    No flowsheet data found.  I have personally reviewed patient's records available from hospital and other providers and incorporated findings in patient care.  Provider notes, lab, EKG, chest x-ray  I have personally reviewed patients ekg done at other facility.  Sinus rhythm, LVH, nonspecific ST-T changes  IMPRESSION.  Chest CT for coronary calcium for 2022     Coronary calcium score 159.  IMPRESSION CT 08/2020     Diffuse hepatic steatosis.  Healed granulomatous disease.  Benign noncalcified small node left lower lobe. No imaging follow-up needed.  Coronary and cardiac findings will be described under a separate report  completed by cardiology.     Interpretation Summary 09/2020         Left Ventricle: Normal left ventricular systolic function with a visually estimated EF of 55 - 60%. Left ventricle size is normal. Increased wall thickness. Findings consistent with mild concentric hypertrophy. Normal wall motion. Diastolic dysfunction present with increased LAP with normal LV EF.  Carlyle Dolly, MD ECG Reader, SPECT Reader, Test Supervisor 09/30/2020       Interpretation Summary 09/2020         Nuclear Findings: Normal left  ventricular systolic function post-stress. LVEF measures 66%.    Nuclear Findings: Normal treadmill myocardial perfusion study at 94 %% PHR. Findings suggest a low risk of myocardial ischemia. LVEF measures 66%.    Nuclear Findings: LVEF measures 66%. LV perfusion is normal.    ECG: Resting ECG demonstrates normal sinus rhythm.    ECG: Stress ECG was negative for ischemia.    Stress Test: A Bruce protocol stress test was performed. Overall, the patient's exercise capacity was poor for their age. The patient exercised for 2 min and 57 sec. Hemodynamics are adequate for diagnosis. Blood pressure demonstrated a normal response and heart rate demonstrated a normal response to stress.     09/2020-event monitor  Rare PAC and PVC.  Occasional symptom with PVC      No flowsheet data found.      Assessment        Diagnosis Orders   1. Atherosclerosis of native coronary artery of native heart without angina pectoris      Stable continue current treatment monitor      2. Essential (primary) hypertension      Stable continue treatment      3. Ventricular premature depolarization      Stable symptom monitor      4. Fatty (change of) liver, not elsewhere classified      Continue dietary modification.  Continue treatment monitor          There are no discontinued medications.    No orders of the defined types were placed in this encounter.              08/2020  Seen for atypical chest pain and palpitation.  Has history of sarcoidosis in past.  Follow-up after testing.  If needed consider sleep apnea evaluation    09/2020  Work-up reviewed.  Medical management.  Follow-up lab after statin treatment continue aspirin  04/2021  Recent increased palpitation blood pressure elevated increase metoprolol to 50 mg twice a day continue other treatment.  Overall has weight loss, continue dietary modification and exercise  10/2021  Cardiac status stable continue current medical management monitor patient continues to have significant weight loss  with dietary modification.  Lab done with PCP

## 2021-11-02 NOTE — Progress Notes (Signed)
1. Have you been to the ER, urgent care clinic since your last visit?  Hospitalized since your last visit?No    2. Have you seen or consulted any other health care providers outside of the [facility] since your last visit?  Include any pap smears or colon screening. No     3. Do you need any refills today?  no

## 2021-11-15 ENCOUNTER — Encounter: Attending: Specialist | Primary: Family Medicine

## 2021-11-15 ENCOUNTER — Ambulatory Visit: Payer: MEDICARE | Attending: Specialist | Primary: Family Medicine

## 2022-04-27 ENCOUNTER — Inpatient Hospital Stay: Admit: 2022-04-27 | Payer: MEDICARE | Primary: Family Medicine

## 2022-04-27 ENCOUNTER — Encounter

## 2022-04-27 DIAGNOSIS — L680 Hirsutism: Secondary | ICD-10-CM

## 2022-04-27 LAB — COMPREHENSIVE METABOLIC PANEL
ALT: 22 U/L (ref 13–56)
AST: 16 U/L (ref 10–38)
Albumin/Globulin Ratio: 0.8 (ref 0.8–1.7)
Albumin: 3.2 g/dL — ABNORMAL LOW (ref 3.4–5.0)
Alk Phosphatase: 128 U/L — ABNORMAL HIGH (ref 45–117)
Anion Gap: 4 mmol/L (ref 3.0–18)
BUN: 18 MG/DL (ref 7.0–18)
Bun/Cre Ratio: 15 (ref 12–20)
CO2: 25 mmol/L (ref 21–32)
Calcium: 8.6 MG/DL (ref 8.5–10.1)
Chloride: 109 mmol/L (ref 100–111)
Creatinine: 1.18 MG/DL (ref 0.6–1.3)
Est, Glom Filt Rate: 51 mL/min/{1.73_m2} — ABNORMAL LOW (ref >60)
Globulin: 4 g/dL (ref 2.0–4.0)
Glucose: 102 mg/dL — ABNORMAL HIGH (ref 74–99)
Potassium: 4.2 mmol/L (ref 3.5–5.5)
Sodium: 138 mmol/L (ref 136–145)
Total Bilirubin: 0.8 MG/DL (ref 0.2–1.0)
Total Protein: 7.2 g/dL (ref 6.4–8.2)

## 2022-04-27 LAB — CORTISOL TOTAL: Cortisol, Random: 7.8 ug/dL

## 2022-04-27 LAB — LUTEINIZING HORMONE: LH: 22.5 m[IU]/mL

## 2022-04-27 LAB — FOLLICLE STIMULATING HORMONE: FSH: 35.9 m[IU]/mL

## 2022-04-28 LAB — ESTRADIOL: Estradiol: 28.3 pg/mL (ref 0.0–54.7)

## 2022-04-28 LAB — ACTH: Acth: 8.7 pg/mL (ref 7.2–63.3)

## 2022-04-28 LAB — PROGESTERONE: Progesterone: 0.1 ng/mL

## 2022-04-28 LAB — PROLACTIN: Prolactin: 5.7 ng/mL

## 2022-04-28 LAB — DHEA-SULFATE: DHEAS (DHEA Sulfate): 67.4 ug/dL (ref 20.4–186.6)

## 2022-04-28 LAB — THYROID CASCADE PROFILE: TSH: 1.41 u[IU]/mL (ref 0.450–4.500)

## 2022-05-01 ENCOUNTER — Ambulatory Visit: Admit: 2022-05-01 | Discharge: 2022-05-01 | Payer: MEDICARE | Attending: Specialist | Primary: Family Medicine

## 2022-05-01 DIAGNOSIS — I2510 Atherosclerotic heart disease of native coronary artery without angina pectoris: Secondary | ICD-10-CM

## 2022-05-01 NOTE — Progress Notes (Signed)
HISTORY OF PRESENT ILLNESS  Yvonne Barton is a 66 y.o. female.    08/25/2020  Patient seen today for new patient evaluation.  She is here for evaluation of chest pain.  She was in emergency room recently with complaint of chest tightness going on for about 10 days.  She has been having episode of chest tightness.  Also has been feeling palpitation.  These are progressive.  She is short of breath and only heavy exertion.  No edema.  No orthopnea she does notice some breathing disturbance during sleep according to her spouse.      Review of Systems   Constitutional:  Negative for chills and fever.   HENT:  Negative for nosebleeds.    Eyes:  Negative for blurred vision and double vision.   Respiratory:  Negative for cough, hemoptysis, sputum production, shortness of breath and wheezing.    Cardiovascular:  Positive for chest pain and palpitations. Negative for orthopnea, claudication, leg swelling and PND.   Gastrointestinal:  Negative for abdominal pain, heartburn, nausea and vomiting.   Musculoskeletal:  Negative for myalgias.   Skin:  Negative for rash.   Neurological:  Negative for dizziness, weakness and headaches.   Endo/Heme/Allergies:  Does not bruise/bleed easily. 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    Gout     Hypercholesteremia     Hypertension     Kidney cysts     Kidney stones     Migraines     not in 7 years    Obesity     Sarcoidosis unsure    pt states in remission "for a long time"       Past Surgical History:   Procedure Laterality Date    COLONOSCOPY      DILATION AND CURETTAGE OF UTERUS      LAP GASTRIC BYPASS/ROUX-EN-Y  2005    TONSILLECTOMY  age 62    UPPER GASTROINTESTINAL ENDOSCOPY      "several  times"       Social History     Tobacco Use    Smoking status: Former     Types: Cigarettes    Smokeless tobacco: Never   Substance Use Topics    Alcohol use: Yes     Comment: occ       Allergies   Allergen Reactions    Latex Rash    Oxycodone-Acetaminophen Anaphylaxis    Lactose Diarrhea       Prior to Admission medications    Medication Sig Start Date End Date Taking? Authorizing Provider   FOLIC ACID PO Take 086 mcg by mouth daily   Yes Automatic Reconciliation, Ar   acetaminophen (TYLENOL) 650 MG extended release tablet Take 1 tablet by mouth as needed   Yes Automatic Reconciliation, Ar   allopurinol (ZYLOPRIM) 100 MG tablet Take by mouth as needed   Yes Automatic Reconciliation, Ar   aspirin 81 MG EC tablet Take 1 tablet by mouth daily   Yes Automatic Reconciliation, Ar  atorvastatin (LIPITOR) 40 MG tablet TAKE 1 TABLET BY MOUTH EVERY DAY 12/12/20  Yes Automatic Reconciliation, Ar   Cholecalciferol 50 MCG (2000 UT) TABS Take 1 tablet by mouth daily 05/25/20  Yes Automatic Reconciliation, Ar   cloNIDine (CATAPRES) 0.1 MG tablet Take 1 tablet by mouth 2 times daily   Yes Automatic Reconciliation, Ar   ibuprofen (ADVIL;MOTRIN) 800 MG tablet Take 1 tablet by mouth every 8 hours as needed 12/20/20  Yes Automatic Reconciliation, Ar   metoprolol tartrate (LOPRESSOR) 50 MG tablet Take 1 tablet by mouth 2 times daily 04/21/21  Yes Automatic Reconciliation, Ar   losartan (COZAAR) 100 MG tablet Take 1 tablet by mouth daily 06/14/20 11/02/21  Automatic Reconciliation, Ar   sodium bicarbonate 650 MG tablet Take 1 tablet by mouth 2 times daily  Patient not taking: Reported on 05/01/2022 05/25/20   Automatic Reconciliation, Ar         BP 138/87 (Site: Right Upper Arm, Position: Sitting)   Pulse 80   Ht 1.626 m ('5\' 4"'$ )   Wt 101.2 kg (223 lb)   SpO2 96%   BMI 38.28 kg/m   Physical Exam  Constitutional:       Appearance: She is well-developed.   HENT:      Head: Normocephalic and atraumatic.   Eyes:      Conjunctiva/sclera:  Conjunctivae normal.   Neck:      Thyroid: No thyromegaly.      Vascular: No JVD.      Trachea: No tracheal deviation.   Cardiovascular:      Rate and Rhythm: Normal rate and regular rhythm.      Chest Wall: PMI is not displaced.      Pulses: No decreased pulses.      Heart sounds: No murmur heard.    No gallop. No S3 sounds.   Pulmonary:      Effort: No respiratory distress.      Breath sounds: No wheezing or rales.   Chest:      Chest wall: No tenderness.   Abdominal:      Palpations: Abdomen is soft.      Tenderness: There is no abdominal tenderness.   Musculoskeletal:      Cervical back: Neck supple.   Skin:     General: Skin is warm.   Neurological:      Mental Status: She is alert and oriented to person, place, and time.       Ms. Choi has a reminder for a "due or due soon" health maintenance. I have asked that she contact her primary care provider for follow-up on this health maintenance.    No flowsheet data found.  I have personally reviewed patient's records available from hospital and other providers and incorporated findings in patient care.  Provider notes, lab, EKG, chest x-ray  I have personally reviewed patients ekg done at other facility.  Sinus rhythm, LVH, nonspecific ST-T changes  IMPRESSION.  Chest CT for coronary calcium for 2022     Coronary calcium score 159.  IMPRESSION CT 08/2020     Diffuse hepatic steatosis.  Healed granulomatous disease.  Benign noncalcified small node left lower lobe. No imaging follow-up needed.  Coronary and cardiac findings will be described under a separate report  completed by cardiology.     Interpretation Summary 09/2020         Left Ventricle: Normal left ventricular systolic function with a visually estimated EF of 55 - 60%. Left ventricle size is normal.  Increased wall thickness. Findings consistent with mild concentric hypertrophy. Normal wall motion. Diastolic dysfunction present with increased LAP with normal LV EF.     Carlyle Dolly, MD ECG Reader,  SPECT Reader, Test Supervisor 09/30/2020       Interpretation Summary 09/2020         Nuclear Findings: Normal left ventricular systolic function post-stress. LVEF measures 66%.    Nuclear Findings: Normal treadmill myocardial perfusion study at 94 %% PHR. Findings suggest a low risk of myocardial ischemia. LVEF measures 66%.    Nuclear Findings: LVEF measures 66%. LV perfusion is normal.    ECG: Resting ECG demonstrates normal sinus rhythm.    ECG: Stress ECG was negative for ischemia.    Stress Test: A Bruce protocol stress test was performed. Overall, the patient's exercise capacity was poor for their age. The patient exercised for 2 min and 57 sec. Hemodynamics are adequate for diagnosis. Blood pressure demonstrated a normal response and heart rate demonstrated a normal response to stress.     09/2020-event monitor  Rare PAC and PVC.  Occasional symptom with PVC  I Have personally reviewed recent relevant labs available and discussed with patient  04/2022-cmp        Assessment        Diagnosis Orders   1. Atherosclerosis of native coronary artery of native heart without angina pectoris  Lipid Panel    Stable continue treatment monitor      2. Essential (primary) hypertension      Stable monitor continue treatment      3. Ventricular premature depolarization      Stable monitor      4. Pulmonary fibrosis, unspecified ([place])      Symptom change continue treatment monitor follow-up with PCP      5. Palpitations      Stable continue beta-blocker monitor          There are no discontinued medications.    Orders Placed This Encounter    Lipid Panel     Standing Status:   Future     Standing Expiration Date:   05/02/2023       Follow-up and Dispositions    Return in about 6 months (around 10/31/2022).             08/2020  Seen for atypical chest pain and palpitation.  Has history of sarcoidosis in past.  Follow-up after testing.  If needed consider sleep apnea evaluation    09/2020  Work-up reviewed.  Medical management.   Follow-up lab after statin treatment continue aspirin  04/2021  Recent increased palpitation blood pressure elevated increase metoprolol to 50 mg twice a day continue other treatment.  Overall has weight loss, continue dietary modification and exercise  10/2021  Cardiac status stable continue current medical management monitor patient continues to have significant weight loss with dietary modification.  Lab done with PCP  04/2022  Cardiac status stable continue current medical management monitor

## 2022-05-01 NOTE — Progress Notes (Signed)
1. Have you been to the ER, urgent care clinic since your last visit?  Hospitalized since your last visit?No    2. Have you seen or consulted any other health care providers outside of the [facility] since your last visit?  Include any pap smears or colon screening. Yes Where: [facility] Reason for visit: Routine Visit

## 2022-05-02 LAB — TESTOSTERONE FREE AND TOTAL, NON MALE
Testosterone, Free: 4.3 pg/mL — ABNORMAL HIGH (ref 0.0–4.2)
Testosterone: 21 ng/dL (ref 7.0–40.0)

## 2022-05-02 LAB — ANDROSTENEDIONE: Androstenedione: 26 ng/dL (ref 17–99)

## 2022-07-20 MED ORDER — METOPROLOL TARTRATE 50 MG PO TABS
50 | ORAL_TABLET | Freq: Two times a day (BID) | ORAL | 3 refills | Status: AC
Start: 2022-07-20 — End: ?

## 2022-10-31 ENCOUNTER — Ambulatory Visit: Payer: MEDICARE | Attending: Cardiovascular Disease | Primary: Family Medicine

## 2022-12-13 ENCOUNTER — Encounter

## 2023-01-18 ENCOUNTER — Ambulatory Visit: Payer: Medicare Other | Admitting: Family Medicine

## 2023-01-18 ENCOUNTER — Encounter: Payer: Self-pay | Admitting: Family Medicine

## 2023-01-18 VITALS — BP 118/86 | HR 87 | Temp 98.6°F | Ht 62.6 in | Wt 218.2 lb

## 2023-01-18 DIAGNOSIS — E538 Deficiency of other specified B group vitamins: Secondary | ICD-10-CM

## 2023-01-18 DIAGNOSIS — R109 Unspecified abdominal pain: Secondary | ICD-10-CM

## 2023-01-18 DIAGNOSIS — N3090 Cystitis, unspecified without hematuria: Secondary | ICD-10-CM

## 2023-01-18 DIAGNOSIS — I10 Essential (primary) hypertension: Secondary | ICD-10-CM

## 2023-01-18 DIAGNOSIS — E785 Hyperlipidemia, unspecified: Secondary | ICD-10-CM | POA: Diagnosis not present

## 2023-01-18 DIAGNOSIS — Z1231 Encounter for screening mammogram for malignant neoplasm of breast: Secondary | ICD-10-CM

## 2023-01-18 DIAGNOSIS — Z23 Encounter for immunization: Secondary | ICD-10-CM

## 2023-01-18 DIAGNOSIS — E118 Type 2 diabetes mellitus with unspecified complications: Secondary | ICD-10-CM

## 2023-01-18 DIAGNOSIS — G43719 Chronic migraine without aura, intractable, without status migrainosus: Secondary | ICD-10-CM

## 2023-01-18 DIAGNOSIS — Z7689 Persons encountering health services in other specified circumstances: Secondary | ICD-10-CM

## 2023-01-18 LAB — POCT URINALYSIS DIP (CLINITEK)
Glucose, UA: NEGATIVE mg/dL
Ketones, POC UA: NEGATIVE mg/dL
Leukocytes, UA: NEGATIVE
Nitrite, UA: POSITIVE — AB
POC PROTEIN,UA: NEGATIVE
Spec Grav, UA: >=1.03 — AB (ref 1.010–1.025)
Urobilinogen, UA: 0.2 U/dL
pH, UA: 6 (ref 5.0–8.0)

## 2023-01-18 MED ORDER — CYANOCOBALAMIN 1000 MCG/ML IJ SOLN
1000.0000 ug | Freq: Once | INTRAMUSCULAR | Status: AC
Start: 2023-01-18 — End: 2023-01-18
  Administered 2023-01-18: 1000 ug via INTRAMUSCULAR

## 2023-01-18 MED ORDER — UBRELVY 100 MG PO TABS: 1.0000 | ORAL_TABLET | ORAL | 1 refills | Status: DC | PRN

## 2023-01-18 MED ORDER — CIPROFLOXACIN HCL 500 MG PO TABS
500.0000 mg | ORAL_TABLET | Freq: Two times a day (BID) | ORAL | 0 refills | Status: AC
Start: 2023-01-18 — End: 2023-01-21

## 2023-01-18 MED ORDER — OZEMPIC (1 MG/DOSE) 4 MG/3ML ~~LOC~~ SOPN: 1.0000 mg | PEN_INJECTOR | SUBCUTANEOUS | 2 refills | Status: DC

## 2023-01-18 NOTE — Progress Notes (Signed)
I,[name], CMA,acting as a Neurosurgeon for [name], NP.,have documented all relevant documentation on the behalf of [name], NP,as directed by  [name], NP while in the presence of [name], NP.  Subjective:  Patient ID: Monique Graham , female    DOB: 1955-08-03 , 67 y.o.   MRN: [id number]  Chief Complaint  Patient presents with   Establish Care   Hypertension   Diabetes    HPI  Patient presents today to establish care. She states she recently relocated from [place] on 12/27/2022.Patient states she has a past medical history of migraine, hypertension, diabetes mellitus.     Past Medical History:  Diagnosis Date   Acute kidney failure (HCC)    Atherosclerotic heart disease of native coronary artery without angina pectoris    Chronic kidney disease (CKD)    Congestive heart failure (CHF) (HCC)    Coronary atherosclerosis due to calcified coronary lesion    Cyst of left kidney    Diabetes mellitus without complication (HCC)    Diverticulosis of large intestine without perforation or abscess without bleeding    Endometrial intraepithelial neoplasia (EIN)    Enlarged heart    Folate deficiency anemia    Gastric ulcer    Hyperlipidemia    Hypertension    Migraine    Nonerosive esophageal reflux disease    Nonrheumatic mitral (valve) insufficiency    Obstructive sleep apnea    Osteonecrosis (HCC)    Osteoporosis    Other specified disorders of bone density and structure, multiple sites    Ovarian cyst    Polycystic ovary syndrome    Premature ventricular contractions    Restless leg syndrome    Vitamin B12 deficiency    Vitamin D deficiency      Family History  Problem Relation Age of Onset   Hypertension Mother    Diabetes Mother    Heart disease Mother    Breast cancer Mother    Diabetes Father    Breast cancer Sister    Breast cancer Cousin    ALS Cousin      Current Outpatient Medications:    aspirin EC 81 MG tablet, Take  81 mg by mouth daily., Disp: , Rfl:    cloNIDine (CATAPRES) 0.1 MG tablet, Take 0.1 mg by mouth 2 (two) times daily., Disp: , Rfl:    cyanocobalamin (VITAMIN B12) 1000 MCG/ML injection, Inject 1,000 mcg into the muscle once a week., Disp: , Rfl:    diclofenac Sodium (VOLTAREN) 1 % GEL, Apply 2 g topically daily as needed., Disp: , Rfl:    Folic Acid (FOLATE PO), Take 666 mcg by mouth daily at 12 noon., Disp: , Rfl:    meclizine (ANTIVERT) 25 MG tablet, Take 25 mg by mouth 3 (three) times daily as needed., Disp: , Rfl:    metoprolol tartrate (LOPRESSOR) 50 MG tablet, Take 50 mg by mouth 2 (two) times daily., Disp: , Rfl:    olmesartan (BENICAR) 40 MG tablet, Take 40 mg by mouth daily., Disp: , Rfl:    Ubrogepant (UBRELVY) 100 MG TABS, Take 1 tablet (100 mg total) by mouth as needed (do not exceed 2 tabs a day)., Disp: 30 tablet, Rfl: 1   OZEMPIC, 1 MG/DOSE, 4 MG/3ML SOPN, Inject 1 mg into the skin once a week., Disp: 3 mL, Rfl: 2   Allergies  Allergen Reactions   Oxycodone-Acetaminophen Anaphylaxis and Other (See Comments)   Latex Rash   Sulfa Antibiotics Other (See Comments)  Review of Systems  Constitutional: Negative.   HENT: Negative.    Respiratory: Negative.    Cardiovascular: Negative.  Negative for leg swelling.  Gastrointestinal: Negative.   Endocrine: Negative for polydipsia, polyphagia and polyuria.  Genitourinary:  Positive for flank pain.  Skin: Negative.   Psychiatric/Behavioral: Negative.       Today's Vitals   01/18/23 0928  BP: 118/86  Pulse: 87  Temp: 98.6 F (37 C)  SpO2: 96%  Weight: 218 lb 3.2 oz (99 kg)  Height: 5' 2.6" (1.59 m)   Body mass index is 39.15 kg/m.  Wt Readings from Last 3 Encounters:  01/18/23 218 lb 3.2 oz (99 kg)    Objective:  Physical Exam HENT:     Head: Normocephalic.  Cardiovascular:     Rate and Rhythm: Normal rate and regular rhythm.  Pulmonary:     Effort: Pulmonary effort is normal.     Breath sounds: Normal breath  sounds.  Abdominal:     General: Abdomen is flat.     Palpations: Abdomen is soft.  Skin:    General: Skin is warm and dry.  Neurological:     General: No focal deficit present.     Mental Status: She is alert and oriented to person, place, and time.  Psychiatric:        Mood and Affect: Mood normal.        Behavior: Behavior normal.         Assessment And Plan:  Establishing care with new doctor, encounter for  Hypertension, unspecified type -     Microalbumin / creatinine urine ratio -     EKG 12-Lead -     BMP8+eGFR -     CBC  Immunization due -     Tdap vaccine greater than or equal to 7yo IM -     Flu Vaccine Trivalent High Dose (Fluad)  DM (diabetes mellitus), type 2 with complications (HCC) -     Hemoglobin A1c -     Ozempic (1 MG/DOSE); Inject 1 mg into the skin once a week.  Dispense: 3 mL; Refill: 2  Intractable chronic migraine without aura and without status migrainosus Bernita Raisin; Take 1 tablet (100 mg total) by mouth as needed (do not exceed 2 tabs a day).  Dispense: 30 tablet; Refill: 1  Hyperlipidemia LDL goal <100 -     Lipid panel  Screening mammogram for breast cancer -     3D Screening Mammogram, Left and Right; Future  Vitamin B12 deficiency -     Cyanocobalamin  Acute right flank pain -     POCT URINALYSIS DIP (CLINITEK)  Cystitis -     Ciprofloxacin HCl; Take 1 tablet (500 mg total) by mouth 2 (two) times daily for 3 days.  Dispense: 6 tablet; Refill: 0    Return for controlled DM check-4 months, One month [place] vitamin b12 injection.  Patient was given opportunity to ask questions. Patient verbalized understanding of the plan and was able to repeat key elements of the plan. All questions were answered to their satisfaction.    I, [name], NP, have reviewed all documentation for this visit. The documentation on 01/30/23 for the exam, diagnosis, procedures, and orders are all accurate and complete.     IF YOU HAVE BEEN REFERRED TO A  SPECIALIST, IT MAY TAKE 1-2 WEEKS TO SCHEDULE/PROCESS THE REFERRAL. IF YOU HAVE NOT HEARD FROM US/SPECIALIST IN TWO WEEKS, PLEASE GIVE [place] A CALL AT  [phone].

## 2023-01-19 LAB — LIPID PANEL
Chol/HDL Ratio: 6.1 ratio — ABNORMAL HIGH (ref 0.0–4.4)
Cholesterol, Total: 269 mg/dL — ABNORMAL HIGH (ref 100–199)
HDL: 44 mg/dL (ref >39)
LDL Chol Calc (NIH): 197 mg/dL — ABNORMAL HIGH (ref 0–99)
Triglycerides: 151 mg/dL — ABNORMAL HIGH (ref 0–149)
VLDL Cholesterol Cal: 28 mg/dL (ref 5–40)

## 2023-01-19 LAB — MICROALBUMIN / CREATININE URINE RATIO
Creatinine, Urine: 189.2 mg/dL
Microalb/Creat Ratio: 4 mg/g{creat} (ref 0–29)
Microalbumin, Urine: 7.6 ug/mL

## 2023-01-19 LAB — BMP8+EGFR
BUN/Creatinine Ratio: 8 — ABNORMAL LOW (ref 12–28)
BUN: 9 mg/dL (ref 8–27)
CO2: 23 mmol/L (ref 20–29)
Calcium: 9.1 mg/dL (ref 8.7–10.3)
Chloride: 104 mmol/L (ref 96–106)
Creatinine, Ser: 1.16 mg/dL — ABNORMAL HIGH (ref 0.57–1.00)
Glucose: 77 mg/dL (ref 70–99)
Potassium: 4.1 mmol/L (ref 3.5–5.2)
Sodium: 139 mmol/L (ref 134–144)
eGFR: 51 mL/min/{1.73_m2} — ABNORMAL LOW (ref >59)

## 2023-01-19 LAB — CBC
Hematocrit: 42 % (ref 34.0–46.6)
Hemoglobin: 12.8 g/dL (ref 11.1–15.9)
MCH: 23.7 pg — ABNORMAL LOW (ref 26.6–33.0)
MCHC: 30.5 g/dL — ABNORMAL LOW (ref 31.5–35.7)
MCV: 78 fL — ABNORMAL LOW (ref 79–97)
Platelets: 262 10*3/uL (ref 150–450)
RBC: 5.4 x10E6/uL — ABNORMAL HIGH (ref 3.77–5.28)
RDW: 14.7 % (ref 11.7–15.4)
WBC: 6 10*3/uL (ref 3.4–10.8)

## 2023-01-19 LAB — HEMOGLOBIN A1C
Est. average glucose Bld gHb Est-mCnc: 120 mg/dL
Hgb A1c MFr Bld: 5.8 % — ABNORMAL HIGH (ref 4.8–5.6)

## 2023-01-30 DIAGNOSIS — I10 Essential (primary) hypertension: Secondary | ICD-10-CM | POA: Insufficient documentation

## 2023-01-30 DIAGNOSIS — E118 Type 2 diabetes mellitus with unspecified complications: Secondary | ICD-10-CM | POA: Insufficient documentation

## 2023-01-30 DIAGNOSIS — E538 Deficiency of other specified B group vitamins: Secondary | ICD-10-CM | POA: Insufficient documentation

## 2023-01-30 DIAGNOSIS — N3090 Cystitis, unspecified without hematuria: Secondary | ICD-10-CM | POA: Insufficient documentation

## 2023-01-30 DIAGNOSIS — R10A1 Flank pain, right side: Secondary | ICD-10-CM | POA: Insufficient documentation

## 2023-01-30 DIAGNOSIS — E785 Hyperlipidemia, unspecified: Secondary | ICD-10-CM | POA: Insufficient documentation

## 2023-01-30 DIAGNOSIS — Z23 Encounter for immunization: Secondary | ICD-10-CM | POA: Insufficient documentation

## 2023-01-30 DIAGNOSIS — G43719 Chronic migraine without aura, intractable, without status migrainosus: Secondary | ICD-10-CM | POA: Insufficient documentation

## 2023-01-30 DIAGNOSIS — Z7689 Persons encountering health services in other specified circumstances: Secondary | ICD-10-CM | POA: Insufficient documentation

## 2023-01-30 DIAGNOSIS — R109 Unspecified abdominal pain: Secondary | ICD-10-CM | POA: Insufficient documentation

## 2023-02-19 ENCOUNTER — Ambulatory Visit: Payer: Medicare Other

## 2023-02-20 ENCOUNTER — Telehealth: Payer: Self-pay

## 2023-02-20 NOTE — Telephone Encounter (Signed)
Prior Auth for both ubrelvy and ozempic has been submitted through [employer]. We are waiting on the determination. YL,RMA

## 2023-02-20 NOTE — Telephone Encounter (Signed)
error 

## 2023-02-21 ENCOUNTER — Ambulatory Visit: Payer: Federal, State, Local not specified - PPO | Admitting: Internal Medicine

## 2023-02-26 ENCOUNTER — Ambulatory Visit: Payer: Medicare Other

## 2023-03-01 ENCOUNTER — Ambulatory Visit: Payer: Medicare Other | Admitting: Family Medicine

## 2023-03-01 ENCOUNTER — Ambulatory Visit: Payer: Medicare Other

## 2023-03-01 ENCOUNTER — Encounter: Payer: Self-pay | Admitting: Family Medicine

## 2023-03-01 ENCOUNTER — Other Ambulatory Visit: Payer: Self-pay | Admitting: Family Medicine

## 2023-03-01 VITALS — BP 130/84 | HR 81 | Temp 97.9°F | Ht 62.0 in | Wt 215.8 lb

## 2023-03-01 DIAGNOSIS — R42 Dizziness and giddiness: Secondary | ICD-10-CM

## 2023-03-01 DIAGNOSIS — Z0000 Encounter for general adult medical examination without abnormal findings: Secondary | ICD-10-CM | POA: Diagnosis not present

## 2023-03-01 DIAGNOSIS — G43909 Migraine, unspecified, not intractable, without status migrainosus: Secondary | ICD-10-CM

## 2023-03-01 DIAGNOSIS — R7303 Prediabetes: Secondary | ICD-10-CM

## 2023-03-01 DIAGNOSIS — N183 Chronic kidney disease, stage 3 unspecified: Secondary | ICD-10-CM

## 2023-03-01 DIAGNOSIS — Z23 Encounter for immunization: Secondary | ICD-10-CM

## 2023-03-01 DIAGNOSIS — E538 Deficiency of other specified B group vitamins: Secondary | ICD-10-CM

## 2023-03-01 DIAGNOSIS — E1169 Type 2 diabetes mellitus with other specified complication: Secondary | ICD-10-CM | POA: Diagnosis not present

## 2023-03-01 DIAGNOSIS — E66812 Obesity, class 2: Secondary | ICD-10-CM

## 2023-03-01 DIAGNOSIS — I130 Hypertensive heart and chronic kidney disease with heart failure and stage 1 through stage 4 chronic kidney disease, or unspecified chronic kidney disease: Secondary | ICD-10-CM | POA: Diagnosis not present

## 2023-03-01 DIAGNOSIS — E1122 Type 2 diabetes mellitus with diabetic chronic kidney disease: Secondary | ICD-10-CM | POA: Diagnosis not present

## 2023-03-01 DIAGNOSIS — N1831 Chronic kidney disease, stage 3a: Secondary | ICD-10-CM

## 2023-03-01 DIAGNOSIS — E785 Hyperlipidemia, unspecified: Secondary | ICD-10-CM

## 2023-03-01 DIAGNOSIS — Z6839 Body mass index (BMI) 39.0-39.9, adult: Secondary | ICD-10-CM

## 2023-03-01 MED ORDER — CYANOCOBALAMIN 1000 MCG/ML IJ SOLN
1000.0000 ug | Freq: Once | INTRAMUSCULAR | Status: AC
Start: 2023-03-01 — End: 2023-03-01
  Administered 2023-03-01: 1000 ug via INTRAMUSCULAR

## 2023-03-01 MED ORDER — UBRELVY 50 MG PO TABS: 1.0000 | ORAL_TABLET | ORAL | 2 refills | Status: DC | PRN

## 2023-03-01 MED ORDER — MECLIZINE HCL 25 MG PO TABS: 25.0000 mg | ORAL_TABLET | Freq: Three times a day (TID) | ORAL | 0 refills | Status: AC | PRN

## 2023-03-01 MED ORDER — ATORVASTATIN CALCIUM 40 MG PO TABS
40.0000 mg | ORAL_TABLET | Freq: Every day | ORAL | 11 refills | Status: DC
Start: 2023-03-01 — End: 2023-06-29

## 2023-03-01 NOTE — Progress Notes (Signed)
Subjective:   Monique Graham is a 67 y.o. female who presents for Medicare Annual (Subsequent) preventive examination.  Visit Complete: In person  Patient Medicare AWV questionnaire was completed by the patient on 03/01/2023; I have confirmed that all information answered by patient is correct and no changes since this date.  Cardiac Risk Factors include: hypertension     Objective:    Today's Vitals   03/01/23 1107 03/01/23 1211  BP: (!) 130/100 130/84  Pulse: (!) 115 81  Temp: 97.9 F (36.6 C)   SpO2: 98%   Weight: 215 lb 12.8 oz (97.9 kg)   Height: 5\' 2"  (1.575 m)    Body mass index is 39.47 kg/m.     03/01/2023   11:18 AM  Advanced Directives  Does Patient Have a Medical Advance Directive? No    Current Medications (verified) Outpatient Encounter Medications as of 03/01/2023  Medication Sig   aspirin EC 81 MG tablet Take 81 mg by mouth daily.   atorvastatin (LIPITOR) 40 MG tablet Take 1 tablet (40 mg total) by mouth daily.   cloNIDine (CATAPRES) 0.1 MG tablet Take 0.1 mg by mouth 2 (two) times daily.   diclofenac Sodium (VOLTAREN) 1 % GEL Apply 2 g topically daily as needed.   Folic Acid (FOLATE PO) Take 666 mcg by mouth daily at 12 noon.   metoprolol tartrate (LOPRESSOR) 50 MG tablet Take 50 mg by mouth 2 (two) times daily.   olmesartan (BENICAR) 40 MG tablet Take 40 mg by mouth daily.   OZEMPIC, 1 MG/DOSE, 4 MG/3ML SOPN Inject 1 mg into the skin once a week.   Ubrogepant (UBRELVY) 50 MG TABS Take 1 tablet (50 mg total) by mouth as needed.   [DISCONTINUED] cyanocobalamin (VITAMIN B12) 1000 MCG/ML injection Inject 1,000 mcg into the muscle once a week.   [DISCONTINUED] meclizine (ANTIVERT) 25 MG tablet Take 25 mg by mouth 3 (three) times daily as needed.   meclizine (ANTIVERT) 25 MG tablet Take 1 tablet (25 mg total) by mouth 3 (three) times daily as needed for up to 10 days.   [DISCONTINUED] Ubrogepant (UBRELVY) 100 MG TABS Take 1 tablet (100 mg  total) by mouth as needed (do not exceed 2 tabs a day). (Patient not taking: Reported on 03/01/2023)   [EXPIRED] cyanocobalamin (VITAMIN B12) injection 1,000 mcg    No facility-administered encounter medications on file as of 03/01/2023.    Allergies (verified) Oxycodone-acetaminophen, Latex, and Sulfa antibiotics   History: Past Medical History:  Diagnosis Date   Acute kidney failure (HCC)    Atherosclerotic heart disease of native coronary artery without angina pectoris    Chronic kidney disease (CKD)    Congestive heart failure (CHF) (HCC)    Coronary atherosclerosis due to calcified coronary lesion    Cyst of left kidney    Diabetes mellitus without complication (HCC)    Diverticulosis of large intestine without perforation or abscess without bleeding    Endometrial intraepithelial neoplasia (EIN)    Enlarged heart    Folate deficiency anemia    Gastric ulcer    Hyperlipidemia    Hypertension    Migraine    Nonerosive esophageal reflux disease    Nonrheumatic mitral (valve) insufficiency    Obstructive sleep apnea    Osteonecrosis (HCC)    Osteoporosis    Other specified disorders of bone density and structure, multiple sites    Ovarian cyst    Polycystic ovary syndrome    Premature ventricular contractions  Restless leg syndrome    Vitamin B12 deficiency    Vitamin D deficiency    Past Surgical History:  Procedure Laterality Date   ABDOMINAL HYSTERECTOMY     OOPHORECTOMY     Family History  Problem Relation Age of Onset   Hypertension Mother    Diabetes Mother    Heart disease Mother    Breast cancer Mother    Diabetes Father    Breast cancer Sister    Breast cancer Cousin    ALS Cousin    Social History   Socioeconomic History   Marital status: Married    Spouse name: Not on file   Number of children: Not on file   Years of education: Not on file   Highest education level: Not on file  Occupational History   Not on file  Tobacco Use   Smoking  status: Former    Current packs/day: 0.00    Types: Cigarettes    Quit date: 01/17/1993    Years since quitting: 30.1    Passive exposure: Never   Smokeless tobacco: Never  Vaping Use   Vaping status: Never Used  Substance and Sexual Activity   Alcohol use: Never   Drug use: Never   Sexual activity: Not on file  Other Topics Concern   Not on file  Social History Narrative   Not on file   Social Determinants of Health   Financial Resource Strain: Low Risk  (03/01/2023)   Overall Financial Resource Strain (CARDIA)    Difficulty of Paying Living Expenses: Not hard at all  Food Insecurity: No Food Insecurity (03/01/2023)   Hunger Vital Sign    Worried About Running Out of Food in the Last Year: Never true    Ran Out of Food in the Last Year: Never true  Transportation Needs: No Transportation Needs (03/01/2023)   PRAPARE - Administrator, Civil Service (Medical): No    Lack of Transportation (Non-Medical): No  Physical Activity: Insufficiently Active (03/01/2023)   Exercise Vital Sign    Days of Exercise per Week: 2 days    Minutes of Exercise per Session: 30 min  Stress: No Stress Concern Present (03/01/2023)   [employer] of Occupational Health - Occupational Stress Questionnaire    Feeling of Stress : Not at all  Social Connections: Moderately Isolated (03/01/2023)   Social Connection and Isolation Panel [NHANES]    Frequency of Communication with Friends and Family: More than three times a week    Frequency of Social Gatherings with Friends and Family: Patient unable to answer    Attends Religious Services: 1 to 4 times per year    Active Member of [employer] or Organizations: No    Attends Banker Meetings: Never    Marital Status: Separated    Tobacco Counseling Counseling given: Yes   Clinical Intake:  Pre-visit preparation completed: Yes  Pain : No/denies pain     Nutritional Risks: None Diabetes: Yes CBG done?: No CBG resulted  in Enter/ Edit results?: No Did pt. bring in CBG monitor from home?: No  How often do you need to have someone help you when you read instructions, pamphlets, or other written materials from your doctor or pharmacy?: 1 - Never What is the last grade level you completed in school?: PHD  Interpreter Needed?: No      Activities of Daily Living    03/01/2023   11:11 AM 01/18/2023    9:41 AM  In your present  state of health, do you have any difficulty performing the following activities:  Hearing? 0 0  Vision? 0 0  Difficulty concentrating or making decisions? 0 0  Walking or climbing stairs? 0 0  Dressing or bathing? 0 0  Doing errands, shopping? 0 0  Preparing Food and eating ? N   Using the Toilet? N   In the past six months, have you accidently leaked urine? N   Do you have problems with loss of bowel control? N   Managing your Medications? N   Managing your Finances? N   Housekeeping or managing your Housekeeping? N     Patient Care Team: [name], NP as PCP - General (Family Medicine)  Indicate any recent Medical Services you may have received from other than [facility] providers in the past year (date may be approximate).     Assessment:   This is a routine wellness examination for Kameisha.  Hearing/Vision screen No results found.   Goals Addressed               This Visit's Progress     Lose Weight (pt-stated)        She states changing her diet. Eating more fruit & vegetables.       Depression Screen    03/01/2023   11:16 AM 01/18/2023    9:28 AM  PHQ 2/9 Scores  PHQ - 2 Score 0 0  PHQ- 9 Score 0 0    Fall Risk    03/01/2023   11:20 AM 01/18/2023    9:28 AM  Fall Risk   Falls in the past year? 0 0  Number falls in past yr: 0 0  Injury with Fall? 0 0  Risk for fall due to : No Fall Risks No Fall Risks  Follow up Falls evaluation completed Falls evaluation completed    MEDICARE RISK AT HOME: Medicare Risk at Home Any stairs in or around the  home?: Yes If so, are there any without handrails?: No Home free of loose throw rugs in walkways, pet beds, electrical cords, etc?: Yes Adequate lighting in your home to reduce risk of falls?: Yes Life alert?: No Use of a cane, walker or w/c?: No Grab bars in the bathroom?: No Shower chair or bench in shower?: No Elevated toilet seat or a handicapped toilet?: No  TIMED UP AND GO:  Was the test performed?  Yes  Length of time to ambulate 10 feet: 6 sec Gait steady and fast without use of assistive device    Cognitive Function:        03/01/2023   11:21 AM  6CIT Screen  What Year? 0 points  What month? 0 points  What time? 0 points  Count back from 20 0 points  Months in reverse 0 points  Repeat phrase 0 points  Total Score 0 points    Immunizations Immunization History  Administered Date(s) Administered   Fluad Trivalent(High Dose 65+) 01/18/2023   Influenza Split 04/21/2020   Influenza,inj,Quad PF,6+ Mos 01/18/2017   Influenza-Unspecified 09/12/2018   PFIZER(Purple Top)SARS-COV-2 Vaccination 12/31/2019, 01/27/2020   Pneumococcal Polysaccharide-23 03/01/2023   Tdap 11/17/2007, 01/18/2023   Zoster, Live 09/23/2020, 04/28/2021    TDAP status: Up to date  Flu Vaccine status: Up to date  Pneumococcal vaccine status: Completed during today's visit.  Covid-19 vaccine status: Completed vaccines  Qualifies for Shingles Vaccine? No   Zostavax completed No   Shingrix Completed?: Yes  Screening Tests Health Maintenance  Topic Date Due  FOOT EXAM  Never done   OPHTHALMOLOGY EXAM  Never done   Zoster Vaccines- Shingrix (1 of 2) 09/02/1973   Colonoscopy  Never done   MAMMOGRAM  Never done   DEXA SCAN  Never done   COVID-19 Vaccine (3 - Pfizer risk series) 02/24/2020   Hepatitis C Screening  02/29/2024 (Originally 09/02/1973)   HEMOGLOBIN A1C  07/18/2023   Diabetic kidney evaluation - eGFR measurement  01/18/2024   Diabetic kidney evaluation - Urine ACR   01/18/2024   Pneumonia Vaccine 65+ Years old (2 of 2 - PCV) 02/29/2024   Medicare Annual Wellness (AWV)  02/29/2024   DTaP/Tdap/Td (3 - Td or Tdap) 01/17/2033   INFLUENZA VACCINE  Completed   HPV VACCINES  Aged Out    Health Maintenance  Health Maintenance Due  Topic Date Due   FOOT EXAM  Never done   OPHTHALMOLOGY EXAM  Never done   Zoster Vaccines- Shingrix (1 of 2) 09/02/1973   Colonoscopy  Never done   MAMMOGRAM  Never done   DEXA SCAN  Never done   COVID-19 Vaccine (3 - Pfizer risk series) 02/24/2020    Colorectal cancer screening: Type of screening: Colonoscopy. Completed in 02/2022. Repeat every 7 years . Was done  at [place], [place]  Mammogram status: Completed  . Repeat every year Scheduled for 03/06/2023 at the [facility]  Bone Density status: Completed  . Results reflect: Bone density results: NORMAL. Repeat every   years. 05/2021  Lung Cancer Screening: (Low Dose CT Chest recommended if Age 21-80 years, 20 pack-year currently smoking OR have quit w/in 15years.) does not qualify.  Stopped smoking over 30 years ago    Additional Screening:  Hepatitis C Screening: does not qualify; Completed   Vision Screening: Recommended annual ophthalmology exams for early detection of glaucoma and other disorders of the eye. Is the patient up to date with their annual eye exam?  Yes  Last exam was done by Dr Thornton Park , [facility] Patient will be referred to  a new ophthalmologist .   Dental Screening: Recommended annual dental exams for proper oral hygiene    Community Resource Referral / Chronic Care Management: CRR required this visit?  No   CCM required this visit?  No     Plan:     I have personally reviewed and noted the following in the patient's chart:   Medical and social history Use of alcohol, tobacco or illicit drugs  Current medications and supplements including opioid prescriptions. Patient is not currently taking opioid  prescriptions. Functional ability and status Nutritional status Physical activity Advanced directives List of other physicians Hospitalizations, surgeries, and ER visits in previous 12 months Vitals Screenings to include cognitive, depression, and falls Referrals and appointments  In addition, I have reviewed and discussed with patient certain preventive protocols, quality metrics, and best practice recommendations. A written personalized care plan for preventive services as well as general preventive health recommendations were provided to patient.   I, [name], NP, have reviewed all documentation for this visit. The documentation on 03/07/2023 for the exam, diagnosis, procedures, and orders are all accurate and complete.

## 2023-03-01 NOTE — Patient Instructions (Signed)

## 2023-03-06 ENCOUNTER — Encounter: Payer: Self-pay | Admitting: Radiology

## 2023-03-06 ENCOUNTER — Ambulatory Visit
Admission: RE | Admit: 2023-03-06 | Discharge: 2023-03-06 | Disposition: A | Discharge status: Home / Self Care | Payer: Federal, State, Local not specified - PPO | Source: Ambulatory Visit | Attending: Family Medicine | Admitting: Family Medicine

## 2023-03-06 DIAGNOSIS — Z1231 Encounter for screening mammogram for malignant neoplasm of breast: Secondary | ICD-10-CM

## 2023-03-07 ENCOUNTER — Other Ambulatory Visit: Payer: Self-pay | Admitting: Family Medicine

## 2023-03-11 DIAGNOSIS — N1831 Chronic kidney disease, stage 3a: Secondary | ICD-10-CM | POA: Insufficient documentation

## 2023-03-11 DIAGNOSIS — I130 Hypertensive heart and chronic kidney disease with heart failure and stage 1 through stage 4 chronic kidney disease, or unspecified chronic kidney disease: Secondary | ICD-10-CM | POA: Insufficient documentation

## 2023-03-11 DIAGNOSIS — N183 Chronic kidney disease, stage 3 unspecified: Secondary | ICD-10-CM | POA: Insufficient documentation

## 2023-03-11 DIAGNOSIS — E66812 Obesity, class 2: Secondary | ICD-10-CM | POA: Insufficient documentation

## 2023-03-11 DIAGNOSIS — E1169 Type 2 diabetes mellitus with other specified complication: Secondary | ICD-10-CM | POA: Insufficient documentation

## 2023-03-11 NOTE — Assessment & Plan Note (Signed)
She is encouraged to strive for BMI less than 30 to decrease cardiac risk. Advised to aim for at least 150 minutes of exercise per week.

## 2023-03-12 ENCOUNTER — Other Ambulatory Visit: Payer: Self-pay | Admitting: Family Medicine

## 2023-03-12 ENCOUNTER — Other Ambulatory Visit: Payer: Self-pay

## 2023-03-12 MED ORDER — DICLOFENAC SODIUM 1 % EX GEL
2.0000 g | Freq: Every day | CUTANEOUS | 1 refills | Status: DC | PRN
  Filled 2023-03-12: qty 100, 50d supply, fill #0

## 2023-03-14 ENCOUNTER — Encounter: Payer: Self-pay | Admitting: Medical Oncology

## 2023-03-14 ENCOUNTER — Emergency Department
Admission: EM | Admit: 2023-03-14 | Discharge: 2023-03-14 | Disposition: A | Discharge status: Home / Self Care | Payer: Medicare Other | Attending: Emergency Medicine | Admitting: Emergency Medicine

## 2023-03-14 ENCOUNTER — Emergency Department: Payer: Medicare Other

## 2023-03-14 ENCOUNTER — Other Ambulatory Visit: Payer: Self-pay

## 2023-03-14 DIAGNOSIS — R209 Unspecified disturbances of skin sensation: Secondary | ICD-10-CM | POA: Diagnosis not present

## 2023-03-14 DIAGNOSIS — I10 Essential (primary) hypertension: Secondary | ICD-10-CM

## 2023-03-14 DIAGNOSIS — I129 Hypertensive chronic kidney disease with stage 1 through stage 4 chronic kidney disease, or unspecified chronic kidney disease: Secondary | ICD-10-CM | POA: Insufficient documentation

## 2023-03-14 DIAGNOSIS — R519 Headache, unspecified: Secondary | ICD-10-CM | POA: Diagnosis not present

## 2023-03-14 DIAGNOSIS — E1122 Type 2 diabetes mellitus with diabetic chronic kidney disease: Secondary | ICD-10-CM | POA: Insufficient documentation

## 2023-03-14 DIAGNOSIS — N189 Chronic kidney disease, unspecified: Secondary | ICD-10-CM | POA: Insufficient documentation

## 2023-03-14 DIAGNOSIS — R002 Palpitations: Secondary | ICD-10-CM

## 2023-03-14 LAB — BASIC METABOLIC PANEL
Anion gap: 10 (ref 5–15)
BUN: 12 mg/dL (ref 8–23)
CO2: 23 mmol/L (ref 22–32)
Calcium: 9 mg/dL (ref 8.9–10.3)
Chloride: 107 mmol/L (ref 98–111)
Creatinine, Ser: 1.05 mg/dL — ABNORMAL HIGH (ref 0.44–1.00)
GFR, Estimated: 58 mL/min — ABNORMAL LOW (ref >60)
Glucose, Bld: 90 mg/dL (ref 70–99)
Potassium: 3.8 mmol/L (ref 3.5–5.1)
Sodium: 140 mmol/L (ref 135–145)

## 2023-03-14 LAB — TROPONIN I (HIGH SENSITIVITY)
Troponin I (High Sensitivity): 4 ng/L (ref <18)
Troponin I (High Sensitivity): 4 ng/L (ref <18)

## 2023-03-14 LAB — CBC
HCT: 43.7 % (ref 36.0–46.0)
Hemoglobin: 13.7 g/dL (ref 12.0–15.0)
MCH: 24.4 pg — ABNORMAL LOW (ref 26.0–34.0)
MCHC: 31.4 g/dL (ref 30.0–36.0)
MCV: 77.8 fL — ABNORMAL LOW (ref 80.0–100.0)
Platelets: 261 10*3/uL (ref 150–400)
RBC: 5.62 MIL/uL — ABNORMAL HIGH (ref 3.87–5.11)
RDW: 15.5 % (ref 11.5–15.5)
WBC: 6.2 10*3/uL (ref 4.0–10.5)
nRBC: 0 % (ref 0.0–0.2)

## 2023-03-14 MED ORDER — IRBESARTAN 75 MG PO TABS
37.5000 mg | ORAL_TABLET | Freq: Every day | ORAL | Status: DC
  Administered 2023-03-14: 37.5 mg via ORAL
  Filled 2023-03-14: qty 0.5

## 2023-03-14 MED ORDER — CLONIDINE HCL 0.1 MG PO TABS
0.1000 mg | ORAL_TABLET | Freq: Once | ORAL | Status: AC
  Administered 2023-03-14: 0.1 mg via ORAL
  Filled 2023-03-14: qty 1

## 2023-03-14 NOTE — Discharge Instructions (Signed)
Please follow-up with your doctor in the next couple days for recheck/reevaluation.  Return to the emergency department for any chest pain, shortness of breath or any other symptom personally concerning to yourself.

## 2023-03-14 NOTE — ED Notes (Signed)
Patient discharged to home per MD order. Patient in stable condition, and deemed medically cleared by ED provider for discharge. Discharge instructions reviewed with patient/family using "Teach Back"; verbalized understanding of medication education and administration, and information about follow-up care. Denies further concerns. ° °

## 2023-03-14 NOTE — ED Triage Notes (Signed)
Pt ambulatory to triage with reports that she feels like she is having palpitations. Denies chest pain but reports headache. Did not take BP meds this am.

## 2023-03-14 NOTE — ED Provider Notes (Signed)
[facility] Provider Note    Event Date/Time   First MD Initiated Contact with Patient 03/14/23 1026     (approximate)  History   Chief Complaint: Palpitations  HPI  Monique Graham is a 67 y.o. female with a past medical history of diabetes, hypertension, hyperlipidemia, migraines, CKD, presents to the emergency department with palpitations and high blood pressure.  According to the patient she has been experiencing intermittent headaches for the last few months, states this morning she was having a headache but also having some palpitations and paresthesias.  Checked her blood pressure and it was elevated so she came to the emergency department for evaluation.  Patient states a history of palpitations, took her home metoprolol this morning but it did not seem to be helping and given her elevated blood pressure she was concerned so she came to the emergency department for evaluation.  Physical Exam   Triage Vital Signs: ED Triage Vitals [03/14/23 0846]  Encounter Vitals Group     BP (!) 171/114     Systolic BP Percentile      Diastolic BP Percentile      Pulse Rate 88     Resp 18     Temp 98.4 F (36.9 C)     Temp Source Oral     SpO2 100 %     Weight 215 lb (97.5 kg)     Height 5\' 2"  (1.575 m)     Head Circumference      Peak Flow      Pain Score 7     Pain Loc      Pain Education      Exclude from Growth Chart     Most recent vital signs: Vitals:   03/14/23 0846  BP: (!) 171/114  Pulse: 88  Resp: 18  Temp: 98.4 F (36.9 C)  SpO2: 100%    General: Awake, no distress.  CV:  Good peripheral perfusion.  Regular rate and rhythm Around 90 bpm Resp:  Normal effort.  Equal breath sounds bilaterally.  Abd:  No distention.  Soft, nontender.  No rebound or guarding. Other:  Equal grip strength bilaterally.  No pronator drift.  Sensation is equal and intact upper arms and legs.  No cranial nerve deficits.  ED Results / Procedures /  Treatments   EKG  EKG viewed and interpreted by myself shows a normal sinus rhythm 87 bpm with a narrow QRS, normal axis, normal intervals, no concerning ST changes.  Reassuring EKG.  RADIOLOGY  I have reviewed and interpreted the chest x-ray images.  No consolidation seen on my evaluation. Radiology is read the x-ray is negative   MEDICATIONS ORDERED IN ED: Medications  irbesartan (AVAPRO) tablet 37.5 mg (has no administration in time range)  cloNIDine (CATAPRES) tablet 0.1 mg (has no administration in time range)     IMPRESSION / MDM / ASSESSMENT AND PLAN / ED COURSE  I reviewed the triage vital signs and the nursing notes.  Patient's presentation is most consistent with acute presentation with potential threat to life or bodily function.  Patient presents to the emergency department with multiple complaints including intermittent headache, palpitations, high blood pressure, paresthesias.  Overall the patient appears well reassuring physical exam, normal neurological exam.  Patient's blood pressure is elevated 171/114.  Patient states due to her palpitations she took her metoprolol this morning but did not take any of her other medications.  We will dose the patient's typical Benicar as well  as clonidine.  Given the patient's headache we will obtain CT imaging of the head as a precaution.  We will continue to closely monitor while awaiting results on cardiac monitoring.  Patient agreeable to plan.  CT scan of the head is negative for acute abnormality.  Lab work is reassuring including repeat troponin which remains negative.  Given the patient's reassuring workup I believe the patient is safe for discharge home with outpatient follow-up.  Patient agreeable to plan of care.  FINAL CLINICAL IMPRESSION(S) / ED DIAGNOSES   Palpitations Headache Hypertension   Note:  This document was prepared using Dragon voice recognition software and may include unintentional dictation errors.    Minna Antis, MD 03/14/23 1224

## 2023-03-25 ENCOUNTER — Other Ambulatory Visit: Payer: Self-pay

## 2023-04-03 ENCOUNTER — Encounter: Payer: Self-pay | Admitting: Family Medicine

## 2023-04-03 ENCOUNTER — Ambulatory Visit: Payer: Medicare Other | Admitting: Family Medicine

## 2023-04-03 VITALS — BP 130/80 | HR 95 | Temp 98.1°F | Ht 62.0 in | Wt 209.0 lb

## 2023-04-03 DIAGNOSIS — R42 Dizziness and giddiness: Secondary | ICD-10-CM

## 2023-04-03 DIAGNOSIS — H9202 Otalgia, left ear: Secondary | ICD-10-CM

## 2023-04-03 DIAGNOSIS — E785 Hyperlipidemia, unspecified: Secondary | ICD-10-CM

## 2023-04-03 DIAGNOSIS — E538 Deficiency of other specified B group vitamins: Secondary | ICD-10-CM

## 2023-04-03 DIAGNOSIS — Z6838 Body mass index (BMI) 38.0-38.9, adult: Secondary | ICD-10-CM

## 2023-04-03 DIAGNOSIS — E1169 Type 2 diabetes mellitus with other specified complication: Secondary | ICD-10-CM

## 2023-04-03 DIAGNOSIS — G43719 Chronic migraine without aura, intractable, without status migrainosus: Secondary | ICD-10-CM

## 2023-04-03 DIAGNOSIS — E6609 Other obesity due to excess calories: Secondary | ICD-10-CM

## 2023-04-03 DIAGNOSIS — J3089 Other allergic rhinitis: Secondary | ICD-10-CM

## 2023-04-03 DIAGNOSIS — E66812 Obesity, class 2: Secondary | ICD-10-CM

## 2023-04-03 MED ORDER — LEVOCETIRIZINE DIHYDROCHLORIDE 5 MG PO TABS: 5.0000 mg | ORAL_TABLET | Freq: Every evening | ORAL | 1 refills | Status: DC

## 2023-04-03 MED ORDER — EZETIMIBE 10 MG PO TABS: 10.0000 mg | ORAL_TABLET | Freq: Every day | ORAL | 11 refills | Status: DC

## 2023-04-03 MED ORDER — NURTEC 75 MG PO TBDP: 1.0000 | ORAL_TABLET | ORAL | 2 refills | Status: DC

## 2023-04-03 MED ORDER — HYDROCORTISONE-ACETIC ACID 1-2 % OT SOLN: 2.0000 [drp] | Freq: Two times a day (BID) | OTIC | 0 refills | Status: AC

## 2023-04-03 MED ORDER — CYANOCOBALAMIN 1000 MCG/ML IJ SOLN
1000.0000 ug | Freq: Once | INTRAMUSCULAR | Status: AC
  Administered 2023-04-03: 1000 ug via INTRAMUSCULAR

## 2023-04-03 NOTE — Progress Notes (Signed)
I,Monique J Llittleton, CMA,acting as a Neurosurgeon for [name], NP.,have documented all relevant documentation on the behalf of Monique Hose, NP,as directed by  Monique Hose, NP while in the presence of Monique Hose, NP.  Subjective:  Patient ID: Monique Graham , female    DOB: 12-05-1955 , 67 y.o.   MRN: [id number]  Chief Complaint  Patient presents with   Dizziness    HPI  Patient is a 67 year old female who presents  today for  recurrent dizziness.  Patient states she has been taking meclizine 25 mg as needed with little relief, right now she is states that she has some pain in her left ear.  Patient also has a history of migraine headaches.  Patient had a CT of her head in October 2024, and it did not show any abnormalities, will refer to ENT for further evaluation.  She was started on Ubrelvy 50 mg as needed for migraines that she states that her insurance did not approve it so she could not use it since her last visit we will switch to Nurtec 75 mg as needed for migraine.  Patient is still for her B12 injections due to vitamin B-12 deficiency     Past Medical History:  Diagnosis Date   Acute kidney failure (HCC)    Atherosclerotic heart disease of native coronary artery without angina pectoris    Chronic kidney disease (CKD)    Congestive heart failure (CHF) (HCC)    Coronary atherosclerosis due to calcified coronary lesion    Cyst of left kidney    Diabetes mellitus without complication (HCC)    Diverticulosis of large intestine without perforation or abscess without bleeding    Endometrial intraepithelial neoplasia (EIN)    Enlarged heart    Folate deficiency anemia    Gastric ulcer    Hyperlipidemia    Hypertension    Migraine    Nonerosive esophageal reflux disease    Nonrheumatic mitral (valve) insufficiency    Obstructive sleep apnea    Osteonecrosis (HCC)    Osteoporosis    Other specified disorders of bone density and structure, multiple sites    Ovarian  cyst    Polycystic ovary syndrome    Premature ventricular contractions    Restless leg syndrome    Vitamin B12 deficiency    Vitamin D deficiency      Family History  Problem Relation Age of Onset   Hypertension Mother    Diabetes Mother    Heart disease Mother    Breast cancer Mother    Diabetes Father    Breast cancer Sister    Breast cancer Cousin    ALS Cousin      Current Outpatient Medications:    aspirin EC 81 MG tablet, Take 81 mg by mouth daily., Disp: , Rfl:    atorvastatin (LIPITOR) 40 MG tablet, Take 1 tablet (40 mg total) by mouth daily., Disp: 30 tablet, Rfl: 11   cloNIDine (CATAPRES) 0.1 MG tablet, Take 0.1 mg by mouth 2 (two) times daily., Disp: , Rfl:    diclofenac Sodium (VOLTAREN) 1 % GEL, Apply 2 g topically daily as needed., Disp: 100 g, Rfl: 1   ezetimibe (ZETIA) 10 MG tablet, Take 1 tablet (10 mg total) by mouth daily., Disp: 30 tablet, Rfl: 11   Folic Acid (FOLATE PO), Take 666 mcg by mouth daily at 12 noon., Disp: , Rfl:    levocetirizine (XYZAL ALLERGY 24HR) 5 MG tablet, Take 1 tablet (5 mg total)  by mouth every evening., Disp: 30 tablet, Rfl: 1   metoprolol tartrate (LOPRESSOR) 50 MG tablet, Take 50 mg by mouth 2 (two) times daily., Disp: , Rfl:    olmesartan (BENICAR) 40 MG tablet, Take 40 mg by mouth daily., Disp: , Rfl:    OZEMPIC, 1 MG/DOSE, 4 MG/3ML SOPN, Inject 1 mg into the skin once a week., Disp: 3 mL, Rfl: 2   Rimegepant Sulfate (NURTEC) 75 MG TBDP, Take 1 tablet (75 mg total) by mouth every other day., Disp: 16 tablet, Rfl: 2   Allergies  Allergen Reactions   Oxycodone-Acetaminophen Anaphylaxis and Other (See Comments)   Latex Rash   Sulfa Antibiotics Other (See Comments)     Review of Systems  Constitutional: Negative.   HENT:  Positive for ear pain and postnasal drip.   Eyes: Negative.   Respiratory: Negative.    Cardiovascular: Negative.   Endocrine: Negative for polydipsia, polyphagia and polyuria.  Skin: Negative.    Neurological:  Positive for headaches.     Today's Vitals   04/03/23 1418  BP: 130/80  Pulse: 95  Temp: 98.1 F (36.7 C)  Weight: 209 lb (94.8 kg)  Height: 5\' 2"  (1.575 m)  PainSc: 0-No pain   Body mass index is 38.23 kg/m.  Wt Readings from Last 3 Encounters:  04/03/23 209 lb (94.8 kg)  03/14/23 215 lb (97.5 kg)  03/01/23 215 lb 12.8 oz (97.9 kg)    The 10-year ASCVD risk score (Arnett DK, et al., 2019) is: 52.8%   Values used to calculate the score:     Age: 38 years     Sex: Female     Is Non-Hispanic African American: Yes     Diabetic: Yes     Tobacco smoker: Yes     Systolic Blood Pressure: 130 mmHg     Is BP treated: Yes     HDL Cholesterol: 44 mg/dL     Total Cholesterol: 269 mg/dL  Objective:  Physical Exam HENT:     Head: Normocephalic.     Nose: Congestion and rhinorrhea present.  Cardiovascular:     Rate and Rhythm: Normal rate and regular rhythm.  Pulmonary:     Effort: Pulmonary effort is normal.  Skin:    General: Skin is warm and dry.  Neurological:     Mental Status: She is alert and oriented to person, place, and time.  Psychiatric:        Mood and Affect: Mood normal.         Assessment And Plan:  Vertigo -     Ambulatory referral to ENT -     Levocetirizine Dihydrochloride; Take 1 tablet (5 mg total) by mouth every evening.  Dispense: 30 tablet; Refill: 1  Vitamin B12 deficiency -     Cyanocobalamin  Dyslipidemia due to type 2 diabetes mellitus (HCC) -     Ezetimibe; Take 1 tablet (10 mg total) by mouth daily.  Dispense: 30 tablet; Refill: 11  Otalgia, left -     Hydrocortisone-Acetic Acid; Place 2 drops into the left ear 2 (two) times daily for 5 days.  Dispense: 1 mL; Refill: 0  Intractable chronic migraine without aura and without status migrainosus -     Nurtec; Take 1 tablet (75 mg total) by mouth every other day.  Dispense: 16 tablet; Refill: 2  Class 2 obesity due to excess calories with body mass index (BMI) of 38.0 to  38.9 in adult, unspecified whether serious comorbidity present Assessment &  Plan: She is encouraged to strive for BMI less than 30 to decrease cardiac risk. Advised to aim for at least 150 minutes of exercise per week.      Return if symptoms worsen or fail to improve.  Patient was given opportunity to ask questions. Patient verbalized understanding of the plan and was able to repeat key elements of the plan. All questions were answered to their satisfaction.    I, Monique Hose, NP, have reviewed all documentation for this visit. The documentation on 04/10/23 for the exam, diagnosis, procedures, and orders are all accurate and complete.   IF YOU HAVE BEEN REFERRED TO A SPECIALIST, IT MAY TAKE 1-2 WEEKS TO SCHEDULE/PROCESS THE REFERRAL. IF YOU HAVE NOT HEARD FROM US/SPECIALIST IN TWO WEEKS, PLEASE GIVE [place] A CALL AT [phone].

## 2023-04-10 ENCOUNTER — Other Ambulatory Visit: Payer: Self-pay | Admitting: Family Medicine

## 2023-04-10 DIAGNOSIS — R42 Dizziness and giddiness: Secondary | ICD-10-CM | POA: Insufficient documentation

## 2023-04-10 DIAGNOSIS — E6609 Other obesity due to excess calories: Secondary | ICD-10-CM | POA: Insufficient documentation

## 2023-04-10 DIAGNOSIS — H9202 Otalgia, left ear: Secondary | ICD-10-CM | POA: Insufficient documentation

## 2023-04-10 DIAGNOSIS — J3089 Other allergic rhinitis: Secondary | ICD-10-CM | POA: Insufficient documentation

## 2023-04-10 NOTE — Assessment & Plan Note (Signed)
Low fat diet advised

## 2023-04-10 NOTE — Assessment & Plan Note (Signed)
She is encouraged to strive for BMI less than 30 to decrease cardiac risk. Advised to aim for at least 150 minutes of exercise per week.

## 2023-04-22 ENCOUNTER — Encounter (INDEPENDENT_AMBULATORY_CARE_PROVIDER_SITE_OTHER): Payer: Self-pay | Admitting: Otolaryngology

## 2023-04-30 ENCOUNTER — Ambulatory Visit (INDEPENDENT_AMBULATORY_CARE_PROVIDER_SITE_OTHER): Payer: Medicare Other

## 2023-04-30 VITALS — BP 130/82 | HR 94 | Temp 98.1°F | Ht 62.0 in | Wt 209.0 lb

## 2023-04-30 DIAGNOSIS — E538 Deficiency of other specified B group vitamins: Secondary | ICD-10-CM

## 2023-04-30 MED ORDER — CYANOCOBALAMIN 1000 MCG/ML IJ SOLN
1000.0000 ug | Freq: Once | INTRAMUSCULAR | Status: AC
  Administered 2023-04-30: 1000 ug via INTRAMUSCULAR

## 2023-04-30 NOTE — Progress Notes (Signed)
Patient presents today for monthly b12 injection.  

## 2023-05-03 ENCOUNTER — Telehealth (INDEPENDENT_AMBULATORY_CARE_PROVIDER_SITE_OTHER): Payer: Self-pay | Admitting: Otolaryngology

## 2023-05-03 ENCOUNTER — Encounter (INDEPENDENT_AMBULATORY_CARE_PROVIDER_SITE_OTHER): Payer: Self-pay

## 2023-05-03 NOTE — Telephone Encounter (Signed)
Tried to call patient to reschedule 12/26 appointment. Unable to leave a message. Sent a MyChart message of cancellation and requested a call back to reschedule.

## 2023-05-09 ENCOUNTER — Institutional Professional Consult (permissible substitution) (INDEPENDENT_AMBULATORY_CARE_PROVIDER_SITE_OTHER): Payer: Federal, State, Local not specified - PPO

## 2023-05-22 ENCOUNTER — Telehealth (INDEPENDENT_AMBULATORY_CARE_PROVIDER_SITE_OTHER): Payer: Self-pay | Admitting: Physician Assistant

## 2023-05-22 NOTE — Telephone Encounter (Signed)
 Date: 05/23/2023 Status: Sch  Time: 9:45 AM [address] [place] [address] Confirmed

## 2023-05-23 ENCOUNTER — Ambulatory Visit (INDEPENDENT_AMBULATORY_CARE_PROVIDER_SITE_OTHER): Payer: Medicare Other | Admitting: Audiology

## 2023-05-23 ENCOUNTER — Encounter (INDEPENDENT_AMBULATORY_CARE_PROVIDER_SITE_OTHER): Payer: Self-pay

## 2023-05-23 ENCOUNTER — Ambulatory Visit (INDEPENDENT_AMBULATORY_CARE_PROVIDER_SITE_OTHER): Payer: Medicare Other | Admitting: Physician Assistant

## 2023-05-23 DIAGNOSIS — R42 Dizziness and giddiness: Secondary | ICD-10-CM

## 2023-05-23 DIAGNOSIS — H903 Sensorineural hearing loss, bilateral: Secondary | ICD-10-CM

## 2023-05-23 DIAGNOSIS — R0982 Postnasal drip: Secondary | ICD-10-CM | POA: Diagnosis not present

## 2023-05-23 MED ORDER — MECLIZINE HCL 25 MG PO TABS: 25.0000 mg | ORAL_TABLET | Freq: Three times a day (TID) | ORAL | 1 refills | Status: DC | PRN

## 2023-05-23 MED ORDER — AZELASTINE HCL 0.1 % NA SOLN: 2.0000 | Freq: Two times a day (BID) | NASAL | 12 refills | Status: AC

## 2023-05-23 MED ORDER — DESLORATADINE 5 MG PO TABS: 5.0000 mg | ORAL_TABLET | Freq: Every day | ORAL | 2 refills | Status: DC

## 2023-05-23 NOTE — Progress Notes (Signed)
  [address] [place], [place] [address] [phone]  Audiological Evaluation    Name: Monique Graham     DOB:   [birth year]      MRN:   [id number]                                                                                     Service Date: 05/23/2023     Accompanied by: unaccompanied   Patient comes today after Reyes Cohen, PA-C sent a referral for a hearing evaluation due to concerns with hearing loss.   Symptoms Yes Details  Hearing loss  [x]  Worse in the left ear - reports it may fluctuate  Tinnitus  [x]  Both ears- reports it fluctuates ( not linked to balance or hearing changes)  Ear pain/ Ear infections  [x]  Some slight left sided ear pain today  Balance problems  [x]  Lightheaded and spinning- she may be getting up form bed or may happen while sitting still  Noise exposure  []    Previous ear surgeries  []    Family history  []    Amplification  []    Other  []      Otoscopy: Right ear: Clear external ear canals and notable landmarks visualized on the tympanic membrane. Left ear:  Clear external ear canals and notable landmarks visualized on the tympanic membrane.  Tympanometry: Right ear: Type A- Normal external ear canal volume with normal middle ear pressure and tympanic membrane compliance Left ear: Type A- Normal external ear canal volume with normal middle ear pressure and tympanic membrane compliance    Pure tone Audiometry: Both ears: Normal hearing from 125 Hz -3000 Hz, then mild to severe sensorineural hearing loss from 4000 Hz - 8000 Hz.    The hearing test results were completed under headphones and re-checked with inserts and results are deemed to be of good reliability. Test technique:  conventional     Speech Audiometry: Right ear- Speech Reception Threshold (SRT) was obtained at 10 dBHL Left ear-Speech Reception Threshold (SRT) was obtained at 15 dBHL   Word Recognition Score Tested using NU-6 (MLV) Right ear: 88% was  obtained at a presentation level of 80 dBHL with contralateral masking which is deemed as  good  Left ear: 84% was obtained at a presentation level of 85 dBHL with contralateral masking which is deemed as  good    Recommendations: Follow up with ENT as scheduled for today. Return for a hearing evaluation if concerns with hearing changes arise or per MD recommendation. Consider various tinnitus strategies, including the use of a sound generator, hearing aids, and/or tinnitus retraining therapy.    Monique Graham MARIE LEROUX-MARTINEZ, AUD

## 2023-05-23 NOTE — Progress Notes (Signed)
 Dear Dr. Petrina, Here is my assessment for our mutual patient, Monique Graham. Thank you for allowing me the opportunity to care for your patient. Please do not hesitate to contact me should you have any other questions. Sincerely, Chyrl Cohen PA-C  Otolaryngology Clinic Note Referring provider: Dr. Petrina HPI:  Monique Graham is a 68 y.o. female kindly referred by Dr. Petrina   The patient presents today for evaluation of dizziness.  The patient notes that starting in May (8 months ago) she experienced episodes of dizziness.  She notes the initial episode started while she was driving a car, she felt like the world was spinning.  She felt nauseous at the same time.  She was in [place]  at that time she went to an urgent care and then subsequently in the ER.  She notes she had a CT of her head which was normal.  The dizziness was not persistent so she was discharged home with meclizine .  She notes that the symptoms continue to recur.  She describes them as a room spinning some nausea and a frontal headache.  She notes they last minutes to no more than an hour and completely resolved in between episodes.  The symptoms are worse with movement, they can be provoked by sitting still as well.  She notes that when he she is having them they are improved with rest.  She denies any associated chest pain, shortness of breath.  She did have palpitations 1 time with them but has not had any since.  She denies any associated neurologic deficits with them, she denies any ringing in the ears.  She notes she occasionally gets a left-sided otitis externa requiring otic drops, this is approximately 2 times per year.  She notes a history of migraines but notes this feels different than her migraines and is not as severe.  She usually does have a headache when she has the symptoms.  When she was started on meclizine  she notes the symptoms improved.  She also was seen in the emergency room at [facility] where  again she had a CT head that was reported normal.  In addition to the vertiginous symptoms she also describes a history of chronic postnasal drainage.  She notes she has had this since she was a kid and thus thought this was normal.  As an adult she realized that is not normal to have constant drainage down the back of her throat.  She notes this does sometimes irritate the back of her throat and has affected her quality of singing.  She also notes that more recently she has had intermittent rhinorrhea.  She was diagnosed with allergies after being tested and was told that she is allergic to dust mites.  She has been using Flonase  with no significant improvement in her symptoms.  She was prescribed Xyzal  but given the cost she never started it.    H&N Surgery: no Personal or FHx of bleeding dz or anesthesia difficulty: no   GLP-1: ozempic  AP/AC: ASA  Tobacco: no. Alcohol: no. Occupation: retired. Lives alone  Independent Review of Additional Tests or Records:  CT head 03/14/2023-  No acute findings, clear sinus no evidence of chronic sinus disease although study is limited   PMH/Meds/All/SocHx/FamHx/ROS:   Past Medical History:  Diagnosis Date   Acute kidney failure (HCC)    Atherosclerotic heart disease of native coronary artery without angina pectoris    Chronic kidney disease (CKD)    Congestive heart failure (CHF) (HCC)  Coronary atherosclerosis due to calcified coronary lesion    Cyst of left kidney    Diabetes mellitus without complication (HCC)    Diverticulosis of large intestine without perforation or abscess without bleeding    Endometrial intraepithelial neoplasia (EIN)    Enlarged heart    Folate deficiency anemia    Gastric ulcer    Hyperlipidemia    Hypertension    Migraine    Nonerosive esophageal reflux disease    Nonrheumatic mitral (valve) insufficiency    Obstructive sleep apnea    Osteonecrosis (HCC)    Osteoporosis    Other specified disorders of bone  density and structure, multiple sites    Ovarian cyst    Polycystic ovary syndrome    Premature ventricular contractions    Restless leg syndrome    Vitamin B12 deficiency    Vitamin D deficiency      Past Surgical History:  Procedure Laterality Date   ABDOMINAL HYSTERECTOMY     OOPHORECTOMY      Family History  Problem Relation Age of Onset   Hypertension Mother    Diabetes Mother    Heart disease Mother    Breast cancer Mother    Diabetes Father    Breast cancer Sister    Breast cancer Cousin    ALS Cousin      Social Connections: Unknown (04/02/2023)   Social Connection and Isolation Panel [NHANES]    Frequency of Communication with Friends and Family: Twice a week    Frequency of Social Gatherings with Friends and Family: Patient declined    Attends Religious Services: More than 4 times per year    Active Member of [employer] or Organizations: Yes    Attends Banker Meetings: Patient declined    Marital Status: Separated  Recent Concern: Social Connections - Moderately Isolated (03/01/2023)   Social Connection and Isolation Panel [NHANES]    Frequency of Communication with Friends and Family: More than three times a week    Frequency of Social Gatherings with Friends and Family: Patient unable to answer    Attends Religious Services: 1 to 4 times per year    Active Member of [employer] or Organizations: No    Attends Banker Meetings: Never    Marital Status: Separated      Current Outpatient Medications:    aspirin EC 81 MG tablet, Take 81 mg by mouth daily., Disp: , Rfl:    atorvastatin  (LIPITOR) 40 MG tablet, Take 1 tablet (40 mg total) by mouth daily., Disp: 30 tablet, Rfl: 11   cloNIDine  (CATAPRES ) 0.1 MG tablet, Take 0.1 mg by mouth 2 (two) times daily., Disp: , Rfl:    diclofenac  Sodium (VOLTAREN ) 1 % GEL, Apply 2 g topically daily as needed., Disp: 100 g, Rfl: 1   ezetimibe  (ZETIA ) 10 MG tablet, Take 1 tablet (10 mg total) by mouth  daily., Disp: 30 tablet, Rfl: 11   Folic Acid (FOLATE PO), Take 666 mcg by mouth daily at 12 noon., Disp: , Rfl:    levocetirizine (XYZAL  ALLERGY 24HR) 5 MG tablet, Take 1 tablet (5 mg total) by mouth every evening., Disp: 30 tablet, Rfl: 1   metoprolol  tartrate (LOPRESSOR ) 50 MG tablet, Take 50 mg by mouth 2 (two) times daily., Disp: , Rfl:    olmesartan  (BENICAR ) 40 MG tablet, Take 40 mg by mouth daily., Disp: , Rfl:    OZEMPIC , 1 MG/DOSE, 4 MG/3ML SOPN, Inject 1 mg into the skin once a week., Disp: 3  mL, Rfl: 2   Rimegepant Sulfate (NURTEC) 75 MG TBDP, Take 1 tablet (75 mg total) by mouth every other day., Disp: 16 tablet, Rfl: 2   Physical Exam:   LMP  (LMP Unknown)   Pertinent Findings  CN II-XII intact, no ataxia, romberg negative, no nystagmus   Bilateral EAC clear and TM intact with well pneumatized middle ear spaces Weber 512: equal Rinne 512: AC > BC b/l  Anterior rhinoscopy: Septum midline; bilateral inferior turbinates with hypertrophy, slightly pale No lesions of oral cavity/oropharynx; dentition wnl No obviously palpable neck masses/lymphadenopathy/thyromegaly No respiratory distress or stridor  Seprately Identifiable Procedures:  None  Impression & Plans:  Monique Graham is a 68 y.o. female with the following   Vertigo-  The patient's presentation is most consistent with vertigo.  She has no acute neurologic complaints or deficits associated with this.  Her symptoms are characteristic of both benign positional vertigo and also has migraines.  She has alarming signs or symptoms, I do not feel she needs any imaging at this time.  I did discuss options including vestibular rehab, vestibular testing, or continued conservative treatment with meclizine  as needed then they watch and wait approach.  The patient would like to treat this conservatively.  These episodes are brief and she has complete resolution in between with no deficits.  She feels that at this point she can live  with it.  She understands that if her symptoms in any way, she has any new symptoms, or she finds this affects her quality of life I would like to see her back immediately, otherwise I plan to see her back in 3 months for repeat follow-up evaluation.  Postnasal drainage-  The patient presents with postnasal drainage which she describes as lifelong.  She also has some rhinorrhea.  She does have a history of allergies, question if this is untreated chronic rhinitis.  She is currently only taking Flonase , I would like her to take an antihistamine I have prescribed Claritin.  I have also added in azelastine .  I would like to see how the symptoms improve over a 4-month period, she will follow back on our clinic.  If she does not have any significant improvement I would consider nasal endoscope for further evaluation     The patient is happy with today's plan, she had no further questions or concerns at today's visit and understands to reach out to us  with any questions or concerns she may have.   - f/u 3 months     Thank you for allowing me the opportunity to care for your patient. Please do not hesitate to contact me should you have any other questions.  Sincerely, Chyrl Cohen PA-C [facility] Phone: [phone] Fax: [phone]  05/23/2023, 9:48 AM

## 2023-05-24 ENCOUNTER — Other Ambulatory Visit (INDEPENDENT_AMBULATORY_CARE_PROVIDER_SITE_OTHER): Payer: Self-pay | Admitting: Physician Assistant

## 2023-05-28 ENCOUNTER — Ambulatory Visit (INDEPENDENT_AMBULATORY_CARE_PROVIDER_SITE_OTHER): Payer: Medicare Other

## 2023-05-28 VITALS — BP 122/80 | HR 100 | Temp 98.1°F | Ht 62.0 in | Wt 209.0 lb

## 2023-05-28 DIAGNOSIS — E538 Deficiency of other specified B group vitamins: Secondary | ICD-10-CM | POA: Diagnosis not present

## 2023-05-28 MED ORDER — CYANOCOBALAMIN 1000 MCG/ML IJ SOLN
1000.0000 ug | Freq: Once | INTRAMUSCULAR | Status: AC
  Administered 2023-05-28: 1000 ug via INTRAMUSCULAR

## 2023-05-28 NOTE — Progress Notes (Signed)
Patient presents today for a vitamin b12 injection. YL,RMA 

## 2023-05-28 NOTE — Patient Instructions (Signed)
 Vitamin B12 Deficiency Vitamin B12 deficiency means that your body does not have enough vitamin B12. The body needs this important vitamin: To make red blood cells. To make genes (DNA). To help the nerves work. If you do not have enough vitamin B12 in your body, you can have health problems, such as not having enough red blood cells in the blood (anemia). What are the causes? Not eating enough foods that contain vitamin B12. Not being able to take in (absorb) vitamin B12 from the food that you eat. Certain diseases. A condition in which the body does not make enough of a certain protein. This results in your body not taking in enough vitamin B12. Having a surgery in which part of the stomach or small intestine is taken out. Taking medicines that make it hard for the body to take in vitamin B12. These include: Heartburn medicines. Some medicines that are used to treat diabetes. What increases the risk? Being an older adult. Eating a vegetarian or vegan diet that does not include any foods that come from animals. Not eating enough foods that contain vitamin B12 while you are pregnant. Taking certain medicines. Having alcoholism. What are the signs or symptoms? In some cases, there are no symptoms. If the condition leads to too few blood cells or nerve damage, symptoms can occur, such as: Feeling weak or tired. Not being hungry. Losing feeling (numbness) or tingling in your hands and feet. Redness and burning of the tongue. Feeling sad (depressed). Confusion or memory problems. Trouble walking. If anemia is very bad, symptoms can include: Being short of breath. Being dizzy. Having a very fast heartbeat. How is this treated? Changing the way you eat and drink, such as: Eating more foods that contain vitamin B12. Drinking little or no alcohol. Getting vitamin B12 shots. Taking vitamin B12 supplements by mouth (orally). Your doctor will tell you the dose that is best for you. Follow  these instructions at home: Eating and drinking  Eat foods that come from animals and have a lot of vitamin B12 in them. These include: Meats and poultry. This includes beef, pork, chicken, [place], and organ meats, such as liver. Seafood, such as clams, rainbow trout, salmon, tuna, and haddock. Eggs. Dairy foods such as milk, yogurt, and cheese. Eat breakfast cereals that have vitamin B12 added to them (are fortified). Check the label. The items listed above may not be a complete list of foods and beverages you can eat and drink. Contact a dietitian for more information. Alcohol use Do not drink alcohol if: Your doctor tells you not to drink. You are pregnant, may be pregnant, or are planning to become pregnant. If you drink alcohol: Limit how much you have to: 0-1 drink a day for women. 0-2 drinks a day for men. Know how much alcohol is in your drink. In the [place] one drink equals one 12 oz bottle of beer (355 mL), one 5 oz glass of wine (148 mL), or one 1 oz glass of hard liquor (44 mL). General instructions Get any vitamin B12 shots if told by your doctor. Take supplements only as told by your doctor. Follow the directions. Keep all follow-up visits. Contact a doctor if: Your symptoms come back. Your symptoms get worse or do not get better with treatment. Get help right away if: You have trouble breathing. You have a very fast heartbeat. You have chest pain. You get dizzy. You faint. These symptoms may be an emergency. Get help right away. Call 911.  Do not wait to see if the symptoms will go away. Do not drive yourself to the hospital. Summary Vitamin B12 deficiency means that your body is not getting enough of the vitamin. In some cases, there are no symptoms of this condition. Treatment may include making a change in the way you eat and drink, getting shots, or taking supplements. Eat foods that have vitamin B12 in them. This information is not intended to replace advice  given to you by your health care provider. Make sure you discuss any questions you have with your health care provider. Document Revised: 12/23/2020 Document Reviewed: 12/23/2020 Elsevier Patient Education  2024 [employer].

## 2023-06-03 ENCOUNTER — Telehealth: Payer: Self-pay

## 2023-06-03 NOTE — Telephone Encounter (Signed)
PA for nurtec has been submitted through covermymeds we are just waiting on the determination. YL,RMA

## 2023-06-07 ENCOUNTER — Other Ambulatory Visit: Payer: Self-pay | Admitting: Family Medicine

## 2023-06-07 DIAGNOSIS — E118 Type 2 diabetes mellitus with unspecified complications: Secondary | ICD-10-CM

## 2023-06-20 ENCOUNTER — Ambulatory Visit: Payer: Medicare Other | Admitting: Family Medicine

## 2023-06-20 ENCOUNTER — Encounter: Payer: Self-pay | Admitting: Family Medicine

## 2023-06-20 VITALS — BP 120/78 | HR 89 | Temp 98.2°F | Ht 62.0 in | Wt 217.0 lb

## 2023-06-20 DIAGNOSIS — G4730 Sleep apnea, unspecified: Secondary | ICD-10-CM

## 2023-06-20 DIAGNOSIS — E1122 Type 2 diabetes mellitus with diabetic chronic kidney disease: Secondary | ICD-10-CM | POA: Diagnosis not present

## 2023-06-20 DIAGNOSIS — E66812 Obesity, class 2: Secondary | ICD-10-CM

## 2023-06-20 DIAGNOSIS — E1169 Type 2 diabetes mellitus with other specified complication: Secondary | ICD-10-CM

## 2023-06-20 DIAGNOSIS — N183 Chronic kidney disease, stage 3 unspecified: Secondary | ICD-10-CM

## 2023-06-20 DIAGNOSIS — Z6839 Body mass index (BMI) 39.0-39.9, adult: Secondary | ICD-10-CM

## 2023-06-20 DIAGNOSIS — I130 Hypertensive heart and chronic kidney disease with heart failure and stage 1 through stage 4 chronic kidney disease, or unspecified chronic kidney disease: Secondary | ICD-10-CM | POA: Diagnosis not present

## 2023-06-20 DIAGNOSIS — E538 Deficiency of other specified B group vitamins: Secondary | ICD-10-CM

## 2023-06-20 DIAGNOSIS — E785 Hyperlipidemia, unspecified: Secondary | ICD-10-CM

## 2023-06-20 DIAGNOSIS — N1831 Chronic kidney disease, stage 3a: Secondary | ICD-10-CM | POA: Diagnosis not present

## 2023-06-20 DIAGNOSIS — L659 Nonscarring hair loss, unspecified: Secondary | ICD-10-CM | POA: Insufficient documentation

## 2023-06-20 MED ORDER — CYANOCOBALAMIN 1000 MCG/ML IJ SOLN: 1000.0000 ug | Freq: Once | INTRAMUSCULAR | Status: DC

## 2023-06-20 MED ORDER — REPATHA 140 MG/ML ~~LOC~~ SOSY: 1.0000 mL | PREFILLED_SYRINGE | SUBCUTANEOUS | 1 refills | Status: DC

## 2023-06-20 NOTE — Progress Notes (Addendum)
 I,[name], CMA,acting as a neurosurgeon for [name], NP.,have documented all relevant documentation on the behalf of [name], NP,as directed by  [name], NP while in the presence of [name], NP.  Subjective:  Patient ID: Monique Graham , female    DOB: [birth year] , 68 y.o.   MRN: [id number]  Chief Complaint  Patient presents with   Diabetes   B12 Injection   Hypertension    HPI  Patient is a 68 year old female who presents today for chronic disease management e.g diabetes, hypertension and dyslipidemia. She states that about a month, she started to notice painful cramps and aches in her legs that gets worse when she takes her atorvastatin  40 mg every day and so she decided to stop taking it. She states that she stopped about 1 month ago and the cramps have stopped and so she has decided to stop taking it.  Advised patient that since she was a diabetic, she needed to be on medicine for cholesterol so she will be on Repatha  140 mg SQ inj bi weekly, patient voiced understanding. Patient has a diagnosis of Sleep Apnea and uses a CPAP but she states she has not used the  CPAP in a long time because when she uses it these days her head hurts, nose is congested  and her eyes are teary. She states that her mask makes her uncomfortable, she would like to be reevaluated.     Past Medical History:  Diagnosis Date   Acute kidney failure (HCC)    Atherosclerotic heart disease of native coronary artery without angina pectoris    Chronic kidney disease (CKD)    Congestive heart failure (CHF) (HCC)    Coronary atherosclerosis due to calcified coronary lesion    Cyst of left kidney    Diabetes mellitus without complication (HCC)    Diverticulosis of large intestine without perforation or abscess without bleeding    Endometrial intraepithelial neoplasia (EIN)    Enlarged heart    Folate deficiency anemia    Gastric ulcer    Hyperlipidemia    Hypertension    Migraine     Nonerosive esophageal reflux disease    Nonrheumatic mitral (valve) insufficiency    Obstructive sleep apnea    Osteonecrosis (HCC)    Osteoporosis    Other specified disorders of bone density and structure, multiple sites    Ovarian cyst    Polycystic ovary syndrome    Premature ventricular contractions    Restless leg syndrome    Vitamin B12 deficiency    Vitamin D deficiency      Family History  Problem Relation Age of Onset   Hypertension Mother    Diabetes Mother    Heart disease Mother    Breast cancer Mother    Diabetes Father    Breast cancer Sister    Breast cancer Cousin    ALS Cousin      Current Outpatient Medications:    aspirin EC 81 MG tablet, Take 81 mg by mouth daily., Disp: , Rfl:    azelastine  (ASTELIN ) 0.1 % nasal spray, Place 2 sprays into both nostrils 2 (two) times daily. Use in each nostril as directed, Disp: 30 mL, Rfl: 12   cloNIDine  (CATAPRES ) 0.1 MG tablet, Take 0.1 mg by mouth 2 (two) times daily., Disp: , Rfl:    desloratadine  (CLARINEX ) 5 MG tablet, Take 1 tablet (5 mg total) by mouth daily., Disp: 30 tablet, Rfl: 2   diclofenac  Sodium (  VOLTAREN ) 1 % GEL, Apply 2 g topically daily as needed., Disp: 100 g, Rfl: 1   Evolocumab  (REPATHA ) 140 MG/ML SOSY, Inject 140 mg into the skin as directed., Disp: 2 mL, Rfl: 1   Folic Acid (FOLATE PO), Take 666 mcg by mouth daily at 12 noon., Disp: , Rfl:    meclizine  (ANTIVERT ) 25 MG tablet, Take 1 tablet (25 mg total) by mouth 3 (three) times daily as needed for dizziness., Disp: 30 tablet, Rfl: 1   metoprolol  tartrate (LOPRESSOR ) 50 MG tablet, Take 50 mg by mouth 2 (two) times daily., Disp: , Rfl:    olmesartan  (BENICAR ) 40 MG tablet, Take 40 mg by mouth daily., Disp: , Rfl:    OZEMPIC , 1 MG/DOSE, 4 MG/3ML SOPN, INJECT 1MG  INTO THE SKIN ONCE A WEEK, Disp: 3 mL, Rfl: 2   Rimegepant Sulfate (NURTEC) 75 MG TBDP, Take 1 tablet (75 mg total) by mouth every other day., Disp: 16 tablet, Rfl: 2   ezetimibe  (ZETIA ) 10  MG tablet, Take 1 tablet (10 mg total) by mouth daily. (Patient not taking: Reported on 06/20/2023), Disp: 30 tablet, Rfl: 11   Allergies  Allergen Reactions   Oxycodone-Acetaminophen Anaphylaxis and Other (See Comments)   Latex Rash   Sulfa Antibiotics Other (See Comments)     Review of Systems  Constitutional: Negative.   HENT: Negative.    Eyes: Negative.   Respiratory: Negative.    Cardiovascular: Negative.   Gastrointestinal: Negative.   Musculoskeletal: Negative.   Skin: Negative.   Psychiatric/Behavioral: Negative.       Today's Vitals   06/20/23 1016  BP: 120/78  Pulse: 89  Temp: 98.2 F (36.8 C)  TempSrc: Oral  Weight: 217 lb (98.4 kg)  Height: 5' 2 (1.575 m)  PainSc: 0-No pain   Body mass index is 39.69 kg/m.  Wt Readings from Last 3 Encounters:  06/20/23 217 lb (98.4 kg)  05/28/23 209 lb (94.8 kg)  04/30/23 209 lb (94.8 kg)    The 10-year ASCVD risk score (Arnett DK, et al., 2019) is: 25.6%   Values used to calculate the score:     Age: 48 years     Sex: Female     Is Non-Hispanic African American: Yes     Diabetic: Yes     Tobacco smoker: No     Systolic Blood Pressure: 120 mmHg     Is BP treated: Yes     HDL Cholesterol: 48 mg/dL     Total Cholesterol: 251 mg/dL  Objective:  Physical Exam HENT:     Head: Normocephalic.  Cardiovascular:     Rate and Rhythm: Normal rate.  Pulmonary:     Effort: Pulmonary effort is normal.  Abdominal:     General: Bowel sounds are normal.  Neurological:     Mental Status: She is alert and oriented to person, place, and time.  Psychiatric:        Mood and Affect: Mood normal.         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  Dispense: 2 mL; Refill: 1 -     Amb Ref to Medical Weight  Management  Vitamin B12 deficiency -  Vitamin B12  Class 2 severe obesity due to excess calories with serious comorbidity and body mass index (BMI) of 39.0 to 39.9 in adult [facility]) Assessment & Plan: She is encouraged to strive for BMI less than 30 to decrease cardiac risk. Advised to aim for at least 150 minutes of exercise per week.    Hair loss -     TSH + free T4  Sleep apnea, unspecified type -     Ambulatory referral to Neurology  Type 2 diabetes mellitus with stage 3a chronic kidney disease, without long-term current use of insulin (HCC) -     Hemoglobin A1c    Return for controlled DM check-4 months, 1 week B12 nurse visit.  Patient was given opportunity to ask questions. Patient verbalized understanding of the plan and was able to repeat key elements of the plan. All questions were answered to their satisfaction.    I, [name], NP, have reviewed all documentation for this visit. The documentation on 06/29/2023 for the exam, diagnosis, procedures, and orders are all accurate and complete.   IF YOU HAVE BEEN REFERRED TO A SPECIALIST, IT MAY TAKE 1-2 WEEKS TO SCHEDULE/PROCESS THE REFERRAL. IF YOU HAVE NOT HEARD FROM US /SPECIALIST IN TWO WEEKS, PLEASE GIVE US  A CALL AT [phone] X [phone].

## 2023-06-20 NOTE — Patient Instructions (Signed)

## 2023-06-21 LAB — CBC
Hematocrit: 41.2 % (ref 34.0–46.6)
Hemoglobin: 12.7 g/dL (ref 11.1–15.9)
MCH: 25 pg — ABNORMAL LOW (ref 26.6–33.0)
MCHC: 30.8 g/dL — ABNORMAL LOW (ref 31.5–35.7)
MCV: 81 fL (ref 79–97)
Platelets: 271 10*3/uL (ref 150–450)
RBC: 5.08 x10E6/uL (ref 3.77–5.28)
RDW: 14.4 % (ref 11.7–15.4)
WBC: 7.3 10*3/uL (ref 3.4–10.8)

## 2023-06-21 LAB — HEMOGLOBIN A1C
Est. average glucose Bld gHb Est-mCnc: 117 mg/dL
Hgb A1c MFr Bld: 5.7 % — ABNORMAL HIGH (ref 4.8–5.6)

## 2023-06-21 LAB — CMP14+EGFR
ALT: 20 [IU]/L (ref 0–32)
AST: 18 [IU]/L (ref 0–40)
Albumin: 3.9 g/dL (ref 3.9–4.9)
Alkaline Phosphatase: 134 [IU]/L — ABNORMAL HIGH (ref 44–121)
BUN/Creatinine Ratio: 16 (ref 12–28)
BUN: 17 mg/dL (ref 8–27)
Bilirubin Total: 0.5 mg/dL (ref 0.0–1.2)
CO2: 24 mmol/L (ref 20–29)
Calcium: 9.3 mg/dL (ref 8.7–10.3)
Chloride: 102 mmol/L (ref 96–106)
Creatinine, Ser: 1.07 mg/dL — ABNORMAL HIGH (ref 0.57–1.00)
Globulin, Total: 3 g/dL (ref 1.5–4.5)
Glucose: 86 mg/dL (ref 70–99)
Potassium: 4.7 mmol/L (ref 3.5–5.2)
Sodium: 139 mmol/L (ref 134–144)
Total Protein: 6.9 g/dL (ref 6.0–8.5)
eGFR: 57 mL/min/{1.73_m2} — ABNORMAL LOW (ref >59)

## 2023-06-21 LAB — LIPID PANEL
Chol/HDL Ratio: 5.2 {ratio} — ABNORMAL HIGH (ref 0.0–4.4)
Cholesterol, Total: 251 mg/dL — ABNORMAL HIGH (ref 100–199)
HDL: 48 mg/dL (ref >39)
LDL Chol Calc (NIH): 174 mg/dL — ABNORMAL HIGH (ref 0–99)
Triglycerides: 158 mg/dL — ABNORMAL HIGH (ref 0–149)
VLDL Cholesterol Cal: 29 mg/dL (ref 5–40)

## 2023-06-21 LAB — VITAMIN B12: Vitamin B-12: 722 pg/mL (ref 232–1245)

## 2023-06-21 LAB — TSH+FREE T4
Free T4: 1.04 ng/dL (ref 0.82–1.77)
TSH: 1.69 u[IU]/mL (ref 0.450–4.500)

## 2023-06-28 ENCOUNTER — Ambulatory Visit: Payer: Medicare Other

## 2023-06-29 ENCOUNTER — Encounter: Payer: Self-pay | Admitting: Family Medicine

## 2023-06-29 NOTE — Progress Notes (Signed)
 Cholesterol levels are elevated. Have you started the Repatha inj yet? Any side effects? Let us know if you have any questions.  Thanks  [employer]

## 2023-06-29 NOTE — Assessment & Plan Note (Addendum)
 She is encouraged to strive for BMI less than 30 to decrease cardiac risk. Advised to aim for at least 150 minutes of exercise per week.

## 2023-07-05 ENCOUNTER — Ambulatory Visit (INDEPENDENT_AMBULATORY_CARE_PROVIDER_SITE_OTHER): Payer: Medicare Other

## 2023-07-05 ENCOUNTER — Telehealth: Payer: Self-pay

## 2023-07-05 VITALS — BP 128/82 | HR 85 | Temp 98.1°F | Ht 62.0 in | Wt 217.0 lb

## 2023-07-05 DIAGNOSIS — E538 Deficiency of other specified B group vitamins: Secondary | ICD-10-CM

## 2023-07-05 MED ORDER — CYANOCOBALAMIN 1000 MCG/ML IJ SOLN
1000.0000 ug | Freq: Once | INTRAMUSCULAR | Status: AC
  Administered 2023-07-05: 1000 ug via INTRAMUSCULAR

## 2023-07-05 NOTE — Progress Notes (Signed)
 Patient presents today for last b12 injection.

## 2023-07-05 NOTE — Telephone Encounter (Signed)
 I have submitted patient's prior auth for repatha we are waiting on the determination. YL,RMA

## 2023-07-22 ENCOUNTER — Other Ambulatory Visit: Payer: Self-pay | Admitting: Family Medicine

## 2023-07-22 NOTE — Telephone Encounter (Signed)
 Last Fill: Unknown  Last OV: 06/20/23 Next OV: 10/18/23  Routing to provider for review/authorization.

## 2023-07-22 NOTE — Telephone Encounter (Signed)
 Copied from CRM [id number]. Topic: Clinical - Medication Refill >> Jul 22, 2023  1:53 PM DeAngela L wrote: Most Recent Primary Care Visit:  Provider: Randa Lynn T  Department: [facility]  Visit Type: NURSE VISIT  Date: 07/05/2023  Medication: olmesartan (BENICAR) 40 MG tablet  Has the patient contacted their pharmacy? Yes  (Agent: If no, request that the patient contact the pharmacy for the refill. If patient does not wish to contact the pharmacy document the reason why and proceed with request.) (Agent: If yes, when and what did the pharmacy advise?)  Is this the correct pharmacy for this prescription? yes If no, delete pharmacy and type the correct one.  This is the patient's preferred pharmacy:  [id number] - [place], [place] - [address] AT [facility] OF [address] [place] [address] [place] [address] Phone: [phone] Fax: [phone]   Has the prescription been filled recently? yes  Is the patient out of the medication? yes  Has the patient been seen for an appointment in the last year OR does the patient have an upcoming appointment? yes  Can we respond through MyChart? Yes   Agent: Please be advised that Rx refills may take up to 3 business days. We ask that you follow-up with your pharmacy.

## 2023-07-25 MED ORDER — OLMESARTAN MEDOXOMIL 40 MG PO TABS: 40.0000 mg | ORAL_TABLET | Freq: Every day | ORAL | 1 refills | Status: DC

## 2023-07-30 ENCOUNTER — Other Ambulatory Visit: Payer: Self-pay | Admitting: Family Medicine

## 2023-07-30 DIAGNOSIS — E785 Hyperlipidemia, unspecified: Secondary | ICD-10-CM

## 2023-08-12 ENCOUNTER — Ambulatory Visit (INDEPENDENT_AMBULATORY_CARE_PROVIDER_SITE_OTHER): Payer: Federal, State, Local not specified - PPO | Admitting: Physician Assistant

## 2023-08-12 ENCOUNTER — Encounter (INDEPENDENT_AMBULATORY_CARE_PROVIDER_SITE_OTHER): Payer: Self-pay | Admitting: Physician Assistant

## 2023-08-12 DIAGNOSIS — R0982 Postnasal drip: Secondary | ICD-10-CM

## 2023-08-12 DIAGNOSIS — G4733 Obstructive sleep apnea (adult) (pediatric): Secondary | ICD-10-CM

## 2023-08-12 DIAGNOSIS — J302 Other seasonal allergic rhinitis: Secondary | ICD-10-CM | POA: Diagnosis not present

## 2023-08-12 DIAGNOSIS — R42 Dizziness and giddiness: Secondary | ICD-10-CM

## 2023-08-12 MED ORDER — FLUTICASONE PROPIONATE 50 MCG/ACT NA SUSP: 2.0000 | Freq: Every day | NASAL | 6 refills | Status: DC

## 2023-08-12 NOTE — Progress Notes (Signed)
 Dear Dr. Moshe Salisbury, Here is my assessment for our mutual patient, Monique Graham. Thank you for allowing me the opportunity to care for your patient. Please do not hesitate to contact me should you have any other questions. Sincerely, Burna Forts PA-C  Otolaryngology Clinic Note Referring provider: Dr. Moshe Salisbury HPI:  Monique Graham is a 68 y.o. female kindly referred by Dr. Moshe Salisbury   The patient is a 68 year old female seen in our office for evaluation of dizziness.  She was last seen by myself on 05/23/2023.  Below is a recap of that encounter.   The patient presents today for evaluation of dizziness.  The patient notes that starting in May (8 months ago) she experienced episodes of dizziness.  She notes the initial episode started while she was driving a car, she felt like the world was spinning.  She felt nauseous at the same time.  She was in [place] at that time she went to an urgent care and then subsequently in the ER.  She notes she had a CT of her head which was normal.  The dizziness was not persistent so she was discharged home with meclizine.  She notes that the symptoms continue to recur.  She describes them as a room spinning some nausea and a frontal headache.  She notes they last minutes to no more than an hour and completely resolved in between episodes.  The symptoms are worse with movement, they can be provoked by sitting still as well.  She notes that when he she is having them they are improved with rest.  She denies any associated chest pain, shortness of breath.  She did have palpitations 1 time with them but has not had any since.  She denies any associated neurologic deficits with them, she denies any ringing in the ears.  She notes she occasionally gets a left-sided otitis externa requiring otic drops, this is approximately 2 times per year.  She notes a history of migraines but notes this feels different than her migraines and is not as severe.  She usually does have a  headache when she has the symptoms.  When she was started on meclizine she notes the symptoms improved.  She also was seen in the emergency room at [facility] where again she had a CT head that was reported normal.  In addition to the vertiginous symptoms she also describes a history of chronic postnasal drainage.  She notes she has had this since she was a kid and thus thought this was normal.  As an adult she realized that is not normal to have constant drainage down the back of her throat.  She notes this does sometimes irritate the back of her throat and has affected her quality of singing.  She also notes that more recently she has had intermittent rhinorrhea.  She was diagnosed with allergies after being tested and was told that she is allergic to dust mites.  She has been using Flonase with no significant improvement in her symptoms.  She was prescribed Xyzal but given the cost she never started it.    Update 08/12/2023-  Since her last office visit she notes that she is still having episodes of vertigo.  She notes they are intermittent and can be present when moving or at rest.  She notes that they are brief and only last for minutes at a time.  She notes complete recovery in between episodes.  She denies any associated neurologic symptoms.  She notes she continues to  have ringing in your ears that is not persistent and not rhythmic.  She notes that the episodes of dizziness are approximately once or twice per week maximum.  She notes ongoing postnasal drip, she has been using the azelastine but has not been using Flonase.  He continues to use her desloratadine.  In addition to the above previous noted complaints she does note she has a history of sleep apnea.  She notes that she had a sleep study that was done in [place] approximately 1 year ago but has not established care with a sleep specialist here in [place].  She notes that she wakes up with headaches, dry mouth and is unable to  tolerate her sleep apnea mask.    Independent Review of Additional Tests or Records:  none   PMH/Meds/All/SocHx/FamHx/ROS:   Past Medical History:  Diagnosis Date   Acute kidney failure (HCC)    Atherosclerotic heart disease of native coronary artery without angina pectoris    Chronic kidney disease (CKD)    Congestive heart failure (CHF) (HCC)    Coronary atherosclerosis due to calcified coronary lesion    Cyst of left kidney    Diabetes mellitus without complication (HCC)    Diverticulosis of large intestine without perforation or abscess without bleeding    Endometrial intraepithelial neoplasia (EIN)    Enlarged heart    Folate deficiency anemia    Gastric ulcer    Hyperlipidemia    Hypertension    Migraine    Nonerosive esophageal reflux disease    Nonrheumatic mitral (valve) insufficiency    Obstructive sleep apnea    Osteonecrosis (HCC)    Osteoporosis    Other specified disorders of bone density and structure, multiple sites    Ovarian cyst    Polycystic ovary syndrome    Premature ventricular contractions    Restless leg syndrome    Vitamin B12 deficiency    Vitamin D deficiency      Past Surgical History:  Procedure Laterality Date   ABDOMINAL HYSTERECTOMY     OOPHORECTOMY      Family History  Problem Relation Age of Onset   Hypertension Mother    Diabetes Mother    Heart disease Mother    Breast cancer Mother    Diabetes Father    Breast cancer Sister    Breast cancer Cousin    ALS Cousin      Social Connections: Unknown (04/02/2023)   Social Connection and Isolation Panel [NHANES]    Frequency of Communication with Friends and Family: Twice a week    Frequency of Social Gatherings with Friends and Family: Patient declined    Attends Religious Services: More than 4 times per year    Active Member of [employer] or Organizations: Yes    Attends Banker Meetings: Patient declined    Marital Status: Separated  Recent Concern: Social  Connections - Moderately Isolated (03/01/2023)   Social Connection and Isolation Panel [NHANES]    Frequency of Communication with Friends and Family: More than three times a week    Frequency of Social Gatherings with Friends and Family: Patient unable to answer    Attends Religious Services: 1 to 4 times per year    Active Member of [employer] or Organizations: No    Attends Banker Meetings: Never    Marital Status: Separated      Current Outpatient Medications:    aspirin EC 81 MG tablet, Take 81 mg by mouth daily., Disp: , Rfl:  azelastine (ASTELIN) 0.1 % nasal spray, Place 2 sprays into both nostrils 2 (two) times daily. Use in each nostril as directed, Disp: 30 mL, Rfl: 12   cloNIDine (CATAPRES) 0.1 MG tablet, Take 0.1 mg by mouth 2 (two) times daily., Disp: , Rfl:    desloratadine (CLARINEX) 5 MG tablet, Take 1 tablet (5 mg total) by mouth daily., Disp: 30 tablet, Rfl: 2   diclofenac Sodium (VOLTAREN) 1 % GEL, Apply 2 g topically daily as needed., Disp: 100 g, Rfl: 1   Evolocumab (REPATHA SURECLICK) 140 MG/ML SOAJ, INJECT 140 MG INTO THE SKIN AS DIRECTED., Disp: 6 mL, Rfl: 1   ezetimibe (ZETIA) 10 MG tablet, Take 1 tablet (10 mg total) by mouth daily. (Patient not taking: Reported on 07/05/2023), Disp: 30 tablet, Rfl: 11   Folic Acid (FOLATE PO), Take 666 mcg by mouth daily at 12 noon., Disp: , Rfl:    meclizine (ANTIVERT) 25 MG tablet, Take 1 tablet (25 mg total) by mouth 3 (three) times daily as needed for dizziness., Disp: 30 tablet, Rfl: 1   metoprolol tartrate (LOPRESSOR) 50 MG tablet, Take 50 mg by mouth 2 (two) times daily., Disp: , Rfl:    olmesartan (BENICAR) 40 MG tablet, Take 1 tablet (40 mg total) by mouth daily., Disp: 90 tablet, Rfl: 1   OZEMPIC, 1 MG/DOSE, 4 MG/3ML SOPN, INJECT 1MG  INTO THE SKIN ONCE A WEEK, Disp: 3 mL, Rfl: 2   Rimegepant Sulfate (NURTEC) 75 MG TBDP, Take 1 tablet (75 mg total) by mouth every other day., Disp: 16 tablet, Rfl: 2   Physical  Exam:   LMP  (LMP Unknown)   Pertinent Findings  CN II-XII intact  Bilateral EAC clear and TM intact with well pneumatized middle ear spaces Anterior rhinoscopy: Septum midline; bilateral inferior turbinates with significant hypertrophy and edema, right greater than left No lesions of oral cavity/oropharynx; dentition within normal limits, surgically absent tonsils No obviously palpable neck masses/lymphadenopathy/thyromegaly No respiratory distress or stridor   Seprately Identifiable Procedures:  None  Impression & Plans:  Monique Graham is a 68 y.o. female with the following   Vertigo-  Patient continues to experience episodes of vertigo.  This is most consistent with BPPV.  This happens less than 2 times per week and reportedly does not significantly affect her quality of life.  She has no other concerning signs or symptoms from a vertiginous standpoint.  I have recommended she follow-up with vestibular rehab for this and continue to express caution in her day-to-day activities.  Postnasal drainage and seasonal allergies-  I would like the patient to continue using the azelastine as well as her desloratadine and Flonase.  She has not been using Flonase, she will also use nasal irrigation.  I would like to see her back in 3 months for reevaluation and potential nasal endoscopy if her symptoms continue to persist.  Sleep apnea-  The patient has a reported history of sleep apnea and has a CPAP at home which she cannot tolerate.  I like her to establish care with a sleep specialist here in the area to see if there are any other options for her.  I have placed a referral for this.   She was given strict return precautions.   - f/u 3 month     Thank you for allowing me the opportunity to care for your patient. Please do not hesitate to contact me should you have any other questions.  Sincerely, Burna Forts PA-C [facility]  Phone: [phone] Fax:  [phone]  08/12/2023, 9:10 AM

## 2023-08-26 ENCOUNTER — Encounter: Payer: Self-pay | Admitting: Sleep Medicine

## 2023-08-26 ENCOUNTER — Ambulatory Visit: Admitting: Sleep Medicine

## 2023-08-26 VITALS — BP 120/86 | HR 66 | Temp 97.1°F | Ht 62.0 in | Wt 227.6 lb

## 2023-08-26 DIAGNOSIS — I10 Essential (primary) hypertension: Secondary | ICD-10-CM | POA: Diagnosis not present

## 2023-08-26 DIAGNOSIS — F5104 Psychophysiologic insomnia: Secondary | ICD-10-CM

## 2023-08-26 DIAGNOSIS — G4733 Obstructive sleep apnea (adult) (pediatric): Secondary | ICD-10-CM

## 2023-08-26 NOTE — Patient Instructions (Signed)

## 2023-08-26 NOTE — Therapy (Signed)
 OUTPATIENT PHYSICAL THERAPY VESTIBULAR EVALUATION     Patient Name: Monique Graham MRN: [id number] DOB:1956-05-08, 68 y.o., female Today's Date: 08/27/2023  END OF SESSION:  PT End of Session - 08/27/23 1410     Visit Number 1    Number of Visits 25    Date for PT Re-Evaluation 11/19/23    PT Start Time 1317    PT Stop Time 1402    PT Time Calculation (min) 45 min    Equipment Utilized During Treatment Gait belt    Activity Tolerance Patient tolerated treatment well;Other (comment)   some dizziness throughout   Behavior During Therapy WFL for tasks assessed/performed             Past Medical History:  Diagnosis Date   Acute kidney failure (HCC)    Atherosclerotic heart disease of native coronary artery without angina pectoris    Chronic kidney disease (CKD)    Congestive heart failure (CHF) (HCC)    Coronary atherosclerosis due to calcified coronary lesion    Cyst of left kidney    Diabetes mellitus without complication (HCC)    Diverticulosis of large intestine without perforation or abscess without bleeding    Endometrial intraepithelial neoplasia (EIN)    Enlarged heart    Folate deficiency anemia    Gastric ulcer    Hyperlipidemia    Hypertension    Migraine    Nonerosive esophageal reflux disease    Nonrheumatic mitral (valve) insufficiency    Obstructive sleep apnea    Osteonecrosis (HCC)    Osteoporosis    Other specified disorders of bone density and structure, multiple sites    Ovarian cyst    Polycystic ovary syndrome    Premature ventricular contractions    Restless leg syndrome    Vitamin B12 deficiency    Vitamin D deficiency    Past Surgical History:  Procedure Laterality Date   ABDOMINAL HYSTERECTOMY     OOPHORECTOMY     Patient Active Problem List   Diagnosis Date Noted   Hair loss 06/20/2023   Sleep apnea 06/20/2023   Otalgia, left 04/10/2023   Vertigo 04/10/2023   Class 2 obesity due to excess calories with body mass  index (BMI) of 38.0 to 38.9 in adult 04/10/2023   Non-seasonal allergic rhinitis 04/10/2023   Benign hypertensive heart and CKD, 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), w CHF (HCC) 03/11/2023   Class 2 severe obesity due to excess calories with serious comorbidity and body mass index (BMI) of 39.0 to 39.9 in adult (HCC) 03/11/2023   Dyslipidemia due to type 2 diabetes mellitus (HCC) 03/11/2023   Type 2 diabetes mellitus with stage 3a chronic kidney disease, without long-term current use of insulin (HCC) 03/11/2023   Vitamin B12 deficiency 01/30/2023   Cystitis 01/30/2023   Acute right flank pain 01/30/2023   Establishing care with new doctor, encounter for 01/30/2023   Hyperlipidemia LDL goal <100 01/30/2023   Intractable chronic migraine without aura and without status migrainosus 01/30/2023   Hypertension 01/30/2023   Immunization due 01/30/2023    PCP: Melodie Spry, NP REFERRING PROVIDER: Lorane Rocker, PA-C  REFERRING DIAG: vertigo   THERAPY DIAG:  Dizziness and giddiness  Unsteadiness on feet  ONSET DATE: May 2024  Rationale for Evaluation and Treatment: Rehabilitation  SUBJECTIVE:   SUBJECTIVE STATEMENT:   The pt is a pleasant 68 y/o female presenting to PT for vertigo/dizziness and imbalance. Pt reports onset of dizziness started May last year. She was driving and dizziness began out  of nowhere. It lasted for 3 minutes at the time. She reports frequency of dizzy episodes has increased a bit more now since last year. She reports a headache and burning in her throat sensation that typically precedes vertigo. She always gets a headache before or during dizzy episodes.  Pt with hx of migraines. She takes ibuprofen or Tylenol to treat her symptoms. She used to have migraines frequently. She says now headaches seem more sinus-related. Headaches now are more mild and are felt in her sinuses or top of head. She does have ringing in her ears sometimes, sometimes occurs before headache, particularly  in R ear, does have pain in L ear.  She reports she has been diagnosed with bilateral hearing loss. She reports her balance is not good and states it is like her equilibrium is off, affects ability to walk up steps, walk around corners. She reports no falls in last 6 months.  She says her legs can hurt at night; this occurred when she took a particular statin. She reports her sleep is not good, she has sleep apnea but has not been using CPAP until yesterday.  PT just moved back to the area and does not have a neurologist here currently. Other information: Pt can have dizziness watching TV, no dizziness looking at her phone, no dizziness in busy stores, no issues with symptoms as passenger in a car. She gets seasick, used to like go fishing, but had to stop.  Pt likes to dance and cook, and she likes to travel.  Treadmills make her dizzy. She goes to the gym, does everything except for treadmill/bikes.   Pt accompanied by: self  PERTINENT HISTORY:    PMH per chart significant for acute kidney failure, atherosclerotic heart disease of native coronary artery without angina pectoris, chronic kidney disease, congestive heart failure, DM, endometrial intraepithelial neoplasia, enlarged heart, folate deficiency anemia, HTN, OSA, osteonecrosis, osteoporosis, vitamin B12 deficiency, Vitamin D deficiency, migraines please refer to chart for full PMH  PAIN:  Are you having pain?  Headache at baseline  PRECAUTIONS: None and Fall  RED FLAGS: None   WEIGHT BEARING RESTRICTIONS: No  FALLS: Has patient fallen in last 6 months? No  LIVING ENVIRONMENT: Lives with: lives with their spouse Stairs:  2 to get into her home no handrails, 12 inside  has bilateral handrails Has following equipment at home:   PLOF: Independent  PATIENT GOALS: Get rid of the vertigo   OBJECTIVE:  Note: Objective measures were completed at Evaluation unless otherwise noted.  DIAGNOSTIC FINDINGS:  Via chart CT HEAD  03/14/23: " FINDINGS: Brain: No evidence of acute infarction, hemorrhage, hydrocephalus, extra-axial collection or mass lesion/mass effect. Similar patchy white matter hypodensities are nonspecific but compatible with chronic microvascular ischemic disease.   Vascular: No hyperdense vessel.   Skull: No acute fracture.   Sinuses/Orbits: Clear sinuses.  No acute orbital findings.   Other: No mastoid effusions.   IMPRESSION: No evidence of acute intracranial abnormality.     Electronically Signed   By: Feliberto Harts M.D.   On: 03/14/2023 12:18"  COGNITION: Overall cognitive status: Within functional limits for tasks assessed   SENSATION: Pt reports occ numbness/tingling in her legs -was present when taking statin   EDEMA:  Reports no swelling    POSTURE:  Rounded shoulders, slight increase kyphosis/fwd head  Cervical ROM:   AROM screen - formal assessment deferred, assessing to determine if pt has sufficient range of motion for vestibular tests, not for normative values  Rotation bilat WFL for testing - no pain Flex - WFL for testing and pain free Ext - WFL for testing - notices headache (has at baseline) with ext more than flex   STRENGTH: deferred    BED MOBILITY:  Bed mobility can make pt dizzy (rolling, supine>sit)  TRANSFERS: Assistive device utilized: None  Sit to stand: Complete Independence Stand to sit: Complete Independence Chair to chair: Complete Independence   GAIT: Gait pattern: increased variability in BOS with head turns/scanning, scanning impairs gait mechanics with pt becoming slightly unsteady Distance walked: clinic distances Assistive device utilized: None Level of assistance: SBA and CGA   FUNCTIONAL TESTS:  Dynamic Gait Index: 21/24  PATIENT SURVEYS:  DHI deferred to future visit  VESTIBULAR ASSESSMENT:   OCULOMOTOR EXAM:  Ocular Alignment: normal  Ocular ROM: No Limitations  Spontaneous Nystagmus:  absent  Gaze-Induced Nystagmus: absent  Smooth Pursuits: intact - does make pt feel dizzy, described as lightheaded   Saccades:  catch-up saccade (undershooting) with L gaze, corrective saccade (undershooting) looking up - some symptoms but dizziness not as bas as with pursuits   Convergence/Divergence:  sees double 2" from face, converges, some dizziness (described as light-headed)   VESTIBULAR - OCULAR REFLEX:   Slow VOR: Normal - makes pt a little dizzy  VOR Cancellation: Normal - makes pt a little dizzy   Head-Impulse Test: Positive to L with corrective saccade - makes pt dizzy but also movement helps headache; positive to R with undershooting and corrective saccade    POSITIONAL TESTING: deferred to future visit                                                                                                                           TREATMENT DATE:   NMR: PT reviews findings of assessment with pt, indications, plan, recommendations to establish with a local neurologist to screen for dizziness related to migraines.   PT reviews HEP with pt and has pt complete 2 sets of 30 sec in session. PT discusses symptoms to watch for: only perform to mildly dizzy levels, dizziness no greater than 20 minutes, do not complete if you have a bad headache or onset of a headache  Access Code: [email or web address] URL: [email or web address] Date: 08/27/2023 Prepared by: Aminta Kales  Exercises - Seated Gaze Stabilization with Head Rotation  - 1 x daily - 7 x weekly - 2 sets - 1 reps - 30 seconds hold PATIENT EDUCATION:  Education details: PT reviews findings of assessment with pt, indications, plan, recommendations to establish with a local neurologist to screen for dizziness related to migraines.  Person educated: Patient Education method: Explanation, Demonstration, Verbal cues, and Handouts Education comprehension: verbalized understanding, returned demonstration, and needs further  education  HOME EXERCISE PROGRAM:  Access Code: [email or web address] URL: [email or web address] Date: 08/27/2023 Prepared by: Aminta Kales  Exercises - Seated Gaze Stabilization with Head Rotation  - 1 x daily - 7 x weekly - 2 sets - 1 reps - 30  seconds hold   GOALS: Goals reviewed with patient? Yes, initiated, to continue with further assessment  SHORT TERM GOALS: Target date: 10/08/2023   Patient will be independent in home exercise program to improve balance, dizziness and mobility for increased QOL and ease with ADLs. Baseline: initiated  Goal status: INITIAL   LONG TERM GOALS: Target date: 11/19/2023    Patient will reduce dizziness handicap inventory score to <50, for less dizziness with ADLs and increased safety with home and work tasks. .  Baseline:  Goal status: INITIAL  2.  Patient will reduce dizziness handicap inventory score to <50, for less dizziness with ADLs and increased safety with home and work tasks.  Baseline:  Goal status: INITIAL  3.  Patient will report at least a 30% reduction in dizzy symptoms with provoking movements since start of PT to indicate increased ease with ADLs and mobility. Baseline: horizontal, vertical head movements, bed mobility and other movements currently make pt dizzy Goal status: INITIAL  4.  Patient will increase dynamic gait index score to at least 23/24 as to demonstrate reduced fall risk and improved dynamic gait balance for better safety with community/home ambulation.   Baseline: 21/24 most difficulty with head turn activities Goal status: INITIAL   ASSESSMENT:  CLINICAL IMPRESSION: Patient is a pleasant 68 y.o. female who was seen today for physical therapy evaluation and treatment for vertigo also with concerns of imbalance. Exam findings indicate dizziness/vertigo can generally impact ADLs. Pt with impaired gait and balance per DGI, and pt with impaired saccades and positive bilateral head impulse test. Many of  today's tests, even when with otherwise normal results, did make pt dizzy, indicating MMST should be completed future visit. Due to frequency of pt headaches that often coincide with vertigo episodes, PT recommends pt see neurologist to screen for dizziness triggered by migraines. The pt will benefit from further skilled PT to improve dizziness, gait, balance and mobility in order to increase ease and safety with ADLs and QOL.  OBJECTIVE IMPAIRMENTS: Abnormal gait, decreased balance, difficulty walking, dizziness, impaired sensation, improper body mechanics, postural dysfunction, and pain.   ACTIVITY LIMITATIONS: bending, stairs, bed mobility, locomotion level, and activities in general can be impacted/impaired if pt having dizzy episode  PARTICIPATION LIMITATIONS: meal prep, cleaning, driving, shopping, community activity, yard work, and ADLs generally impaired it pt having dizzy episode  PERSONAL FACTORS: Age, Sex, Time since onset of injury/illness/exacerbation, and 3+ comorbidities: PMH per chart significant for acute kidney failure, atherosclerotic heart disease of native coronary artery without angina pectoris, chronic kidney disease, congestive heart failure, DM, endometrial intraepithelial neoplasia, enlarged heart, folate deficiency anemia, HTN, OSA, osteonecrosis, osteoporosis, vitamin B12 deficiency, Vitamin D deficiency, migraines please refer to chart for full PMH  are also affecting patient's functional outcome.   REHAB POTENTIAL: Good  CLINICAL DECISION MAKING: Evolving/moderate complexity  EVALUATION COMPLEXITY: Moderate   PLAN:  PT FREQUENCY: 1-2x/week  PT DURATION: 12 weeks  PLANNED INTERVENTIONS: 97164- PT Re-evaluation, 97750- Physical Performance Testing, 97110-Therapeutic exercises, 97530- Therapeutic activity, [id number]- Neuromuscular re-education, 97535- Self Care, 16109- Manual therapy, [id number]- Gait training, [phone]- Orthotic Initial, [phone]- Orthotic/Prosthetic subsequent,  [phone]- Canalith repositioning, [id number]- Electrical stimulation (unattended), [phone]- Electrical stimulation (manual), Patient/Family education, Balance training, Stair training, Dry Needling, Joint mobilization, Spinal mobilization, Vestibular training, DME instructions, Cryotherapy, and Moist heat  PLAN FOR NEXT SESSION: MMST, positional screen if time permits, advance HEP, balance exercises, cardio if time   Baird Kay, PT 08/27/2023, 2:36 PM

## 2023-08-26 NOTE — Progress Notes (Signed)
 Name:Monique Graham MRN: [id number] DOB: 06/29/1955   CHIEF COMPLAINT:  ESTABLISH CARE FOR OSA   HISTORY OF PRESENT ILLNESS:  Monique Graham is a 68 y.o. w/ a h/o OSA, HTN, hyperlipidemia and morbid obesity who presents to establish care for OSA. Reports that she was initially diagnosed with mild OSA last year and was subsequently started on CPAP therapy. Reports difficulty using CPAP therapy consistently due to mask leaks, nasal congestion and sore throat. Reports feeling unrefreshed upon awakening with PAP therapy.   Reports c/o loud snoring. Reports nocturnal awakenings due to unclear reasons and has difficulty falling back to sleep. Reports a 20 lb weight gain over the last year. Admits to morning headaches and dry mouth. Denies RLS symptoms, dream enactment, cataplexy, hypnagogic or hypnapompic hallucinations. Denies a family history of sleep apnea. Denies drowsy driving. Drinks 1 cup of coffee weekly, denies alcohol use, former smoker, denies illicit drug use.   Bedtime 9-11:30 pm Sleep onset 10 mins Rise time 10 am   EPWORTH SLEEP SCORE 5     No data to display           PAST MEDICAL HISTORY :   has a past medical history of Acute kidney failure (HCC), Atherosclerotic heart disease of native coronary artery without angina pectoris, Chronic kidney disease (CKD), Congestive heart failure (CHF) (HCC), Coronary atherosclerosis due to calcified coronary lesion, Cyst of left kidney, Diabetes mellitus without complication (HCC), Diverticulosis of large intestine without perforation or abscess without bleeding, Endometrial intraepithelial neoplasia (EIN), Enlarged heart, Folate deficiency anemia, Gastric ulcer, Hyperlipidemia, Hypertension, Migraine, Nonerosive esophageal reflux disease, Nonrheumatic mitral (valve) insufficiency, Obstructive sleep apnea, Osteonecrosis (HCC), Osteoporosis, Other specified disorders of bone density and structure, multiple sites, Ovarian  cyst, Polycystic ovary syndrome, Premature ventricular contractions, Restless leg syndrome, Vitamin B12 deficiency, and Vitamin D deficiency.  has a past surgical history that includes Abdominal hysterectomy and Oophorectomy. Prior to Admission medications   Medication Sig Start Date End Date Taking? Authorizing Provider  aspirin EC 81 MG tablet Take 81 mg by mouth daily.    [provider]  azelastine (ASTELIN) 0.1 % nasal spray Place 2 sprays into both nostrils 2 (two) times daily. Use in each nostril as directed 05/23/23   Hedges, Tinnie Gens, PA-C  cloNIDine (CATAPRES) 0.1 MG tablet Take 0.1 mg by mouth 2 (two) times daily. 12/13/20   [provider]  desloratadine (CLARINEX) 5 MG tablet Take 1 tablet (5 mg total) by mouth daily. 05/23/23 05/22/24  Hedges, Tinnie Gens, PA-C  diclofenac Sodium (VOLTAREN) 1 % GEL Apply 2 g topically daily as needed. 03/12/23   [name], NP  Evolocumab (REPATHA SURECLICK) 140 MG/ML SOAJ INJECT 140 MG INTO THE SKIN AS DIRECTED. 07/30/23   [name], NP  ezetimibe (ZETIA) 10 MG tablet Take 1 tablet (10 mg total) by mouth daily. Patient not taking: Reported on 07/05/2023 04/03/23 04/02/24  [name], NP  fluticasone [facility]) 50 MCG/ACT nasal spray Place 2 sprays into both nostrils daily. 08/12/23   Hedges, Tinnie Gens, PA-C  Folic Acid (FOLATE PO) Take 666 mcg by mouth daily at 12 noon.    [provider]  meclizine (ANTIVERT) 25 MG tablet Take 1 tablet (25 mg total) by mouth 3 (three) times daily as needed for dizziness. 05/23/23   Hedges, Tinnie Gens, PA-C  metoprolol tartrate (LOPRESSOR) 50 MG tablet Take 50 mg by mouth 2 (two) times daily. 04/21/21   [provider]  olmesartan (BENICAR) 40 MG tablet Take 1  tablet (40 mg total) by mouth daily. 07/25/23   [name], NP  OZEMPIC, 1 MG/DOSE, 4 MG/3ML SOPN INJECT 1MG  INTO THE SKIN ONCE A WEEK 06/11/23   [name], NP  Rimegepant Sulfate (NURTEC) 75 MG TBDP Take 1 tablet (75 mg total) by mouth  every other day. 04/03/23 07/08/23  [name], NP   Allergies  Allergen Reactions   Oxycodone-Acetaminophen Anaphylaxis and Other (See Comments)   Latex Rash   Sulfa Antibiotics Other (See Comments)    FAMILY HISTORY:  family history includes ALS in her cousin; Breast cancer in her cousin, mother, and sister; Diabetes in her father and mother; Heart disease in her mother; Hypertension in her mother. SOCIAL HISTORY:  reports that she quit smoking about 30 years ago. Her smoking use included cigarettes. She has never been exposed to tobacco smoke. She has never used smokeless tobacco. She reports that she does not drink alcohol and does not use drugs.   Review of Systems:  Gen:  Denies  fever, sweats, chills weight loss  HEENT: Denies blurred vision, double vision, ear pain, eye pain, hearing loss, nose bleeds, sore throat Cardiac:  No dizziness, chest pain or heaviness, chest tightness,edema, No JVD Resp:   No cough, -sputum production, -shortness of breath,-wheezing, -hemoptysis,  Gi: Denies swallowing difficulty, stomach pain, nausea or vomiting, diarrhea, constipation, bowel incontinence Gu:  Denies bladder incontinence, burning urine Ext:   Denies Joint pain, stiffness or swelling Skin: Denies  skin rash, easy bruising or bleeding or hives Endoc:  Denies polyuria, polydipsia , polyphagia or weight change Psych:   Denies depression, insomnia or hallucinations  Other:  All other systems negative  VITAL SIGNS: BP 120/86 (BP Location: Left Arm, Cuff Size: Normal)   Pulse 66   Temp (!) 97.1 F (36.2 C)   Ht 5\' 2"  (1.575 m)   Wt 227 lb 9.6 oz (103.2 kg)   LMP  (LMP Unknown)   SpO2 100%   BMI 41.63 kg/m     Physical Examination:   General Appearance: No distress  EYES PERRLA, EOM intact.   NECK Supple, No JVD Pulmonary: normal breath sounds, No wheezing.  CardiovascularNormal S1,S2.  No m/r/g.   Abdomen: Benign, Soft, non-tender. Skin:   warm, no rashes, no ecchymosis   Extremities: normal, no cyanosis, clubbing. Neuro:without focal findings,  speech normal  PSYCHIATRIC: Mood, affect within normal limits.   ASSESSMENT AND PLAN  OSA For pressure discomfort, changed pressure setting to 4-10 cm H2O. Also fit patient with the Airfit F30i FFM. Counseled patient on the importance of using CPAP therapy. Discussed the consequences of untreated sleep apnea. Advised not to drive drowsy for safety of patient and others. Will follow up in 2 months to review CPAP efficacy and compliance data.    HTN Stable, on current management. Following with PCP.   Morbid obesity Counseled patient on diet and lifestyle modification.  Insomnia Counseled patient on stimulus control and improving sleep hygiene practices.    Patient  satisfied with Plan of action and management. All questions answered  I spent a total of 50 minutes reviewing chart data, face-to-face evaluation with the patient, counseling and coordination of care as detailed above.    Tempie Hoist, M.D.  [facility]

## 2023-08-27 ENCOUNTER — Encounter: Admitting: Physical Therapy

## 2023-08-27 ENCOUNTER — Ambulatory Visit: Attending: Physician Assistant

## 2023-08-27 DIAGNOSIS — M542 Cervicalgia: Secondary | ICD-10-CM | POA: Insufficient documentation

## 2023-08-27 DIAGNOSIS — R2681 Unsteadiness on feet: Secondary | ICD-10-CM | POA: Diagnosis present

## 2023-08-27 DIAGNOSIS — R42 Dizziness and giddiness: Secondary | ICD-10-CM | POA: Insufficient documentation

## 2023-08-29 ENCOUNTER — Ambulatory Visit

## 2023-08-29 DIAGNOSIS — R42 Dizziness and giddiness: Secondary | ICD-10-CM

## 2023-08-29 NOTE — Therapy (Signed)
 OUTPATIENT PHYSICAL THERAPY VESTIBULAR TREATMENT     Patient Name: Monique Graham MRN: [id number] DOB:14-Aug-1955, 68 y.o., female Today's Date: 08/31/2023  END OF SESSION:  PT End of Session - 08/31/23 0909     Visit Number 2    Number of Visits 25    Date for PT Re-Evaluation 11/19/23    PT Start Time 1318    PT Stop Time 1358    PT Time Calculation (min) 40 min    Equipment Utilized During Treatment Gait belt    Activity Tolerance Patient tolerated treatment well    Behavior During Therapy WFL for tasks assessed/performed              Past Medical History:  Diagnosis Date   Acute kidney failure (HCC)    Atherosclerotic heart disease of native coronary artery without angina pectoris    Chronic kidney disease (CKD)    Congestive heart failure (CHF) (HCC)    Coronary atherosclerosis due to calcified coronary lesion    Cyst of left kidney    Diabetes mellitus without complication (HCC)    Diverticulosis of large intestine without perforation or abscess without bleeding    Endometrial intraepithelial neoplasia (EIN)    Enlarged heart    Folate deficiency anemia    Gastric ulcer    Hyperlipidemia    Hypertension    Migraine    Nonerosive esophageal reflux disease    Nonrheumatic mitral (valve) insufficiency    Obstructive sleep apnea    Osteonecrosis (HCC)    Osteoporosis    Other specified disorders of bone density and structure, multiple sites    Ovarian cyst    Polycystic ovary syndrome    Premature ventricular contractions    Restless leg syndrome    Vitamin B12 deficiency    Vitamin D deficiency    Past Surgical History:  Procedure Laterality Date   ABDOMINAL HYSTERECTOMY     OOPHORECTOMY     Patient Active Problem List   Diagnosis Date Noted   Hair loss 06/20/2023   Sleep apnea 06/20/2023   Otalgia, left 04/10/2023   Vertigo 04/10/2023   Class 2 obesity due to excess calories with body mass index (BMI) of 38.0 to 38.9 in adult  04/10/2023   Non-seasonal allergic rhinitis 04/10/2023   Benign hypertensive heart and CKD, 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), w CHF (HCC) 03/11/2023   Class 2 severe obesity due to excess calories with serious comorbidity and body mass index (BMI) of 39.0 to 39.9 in adult (HCC) 03/11/2023   Dyslipidemia due to type 2 diabetes mellitus (HCC) 03/11/2023   Type 2 diabetes mellitus with stage 3a chronic kidney disease, without long-term current use of insulin (HCC) 03/11/2023   Vitamin B12 deficiency 01/30/2023   Cystitis 01/30/2023   Acute right flank pain 01/30/2023   Establishing care with new doctor, encounter for 01/30/2023   Hyperlipidemia LDL goal <100 01/30/2023   Intractable chronic migraine without aura and without status migrainosus 01/30/2023   Hypertension 01/30/2023   Immunization due 01/30/2023    PCP: Melodie Spry, NP REFERRING PROVIDER: Lorane Rocker, PA-C  REFERRING DIAG: vertigo   THERAPY DIAG:  Dizziness and giddiness  ONSET DATE: May 2024  Rationale for Evaluation and Treatment: Rehabilitation  SUBJECTIVE:   SUBJECTIVE STATEMENT: Pt reports no updates from eval. She is feeling a little off today. Pt slept throughout the night with her CPAP. She still feels a little dizzy with a little headache between her eyes.  Pt rates her symptoms 5/10.  Pt accompanied by: self  PERTINENT HISTORY:  From Eval:  The pt is a pleasant 68 y/o female presenting to PT for vertigo/dizziness and imbalance. Pt reports onset of dizziness started May last year. She was driving and dizziness began out of nowhere. It lasted for 3 minutes at the time. She reports frequency of dizzy episodes has increased a bit more now since last year. She reports a headache and burning in her throat sensation that typically precedes vertigo. She always gets a headache before or during dizzy episodes.  Pt with hx of migraines. She takes ibuprofen or Tylenol to treat her symptoms. She used to have migraines frequently.  She says now headaches seem more sinus-related. Headaches now are more mild and are felt in her sinuses or top of head. She does have ringing in her ears sometimes, sometimes occurs before headache, particularly in R ear, does have pain in L ear.  She reports she has been diagnosed with bilateral hearing loss. She reports her balance is not good and states it is like her equilibrium is off, affects ability to walk up steps, walk around corners. She reports no falls in last 6 months.  She says her legs can hurt at night; this occurred when she took a particular statin. She reports her sleep is not good, she has sleep apnea but has not been using CPAP until yesterday.  PT just moved back to the area and does not have a neurologist here currently. Other information: Pt can have dizziness watching TV, no dizziness looking at her phone, no dizziness in busy stores, no issues with symptoms as passenger in a car. She gets seasick, used to like go fishing, but had to stop.  Pt likes to dance and cook, and she likes to travel.  Treadmills make her dizzy. She goes to the gym, does everything except for treadmill/bikes.  PMH per chart significant for acute kidney failure, atherosclerotic heart disease of native coronary artery without angina pectoris, chronic kidney disease, congestive heart failure, DM, endometrial intraepithelial neoplasia, enlarged heart, folate deficiency anemia, HTN, OSA, osteonecrosis, osteoporosis, vitamin B12 deficiency, Vitamin D deficiency, migraines please refer to chart for full PMH  PAIN:  Are you having pain?  Headache at baseline  PRECAUTIONS: None and Fall  RED FLAGS: None   WEIGHT BEARING RESTRICTIONS: No  FALLS: Has patient fallen in last 6 months? No  LIVING ENVIRONMENT: Lives with: lives with their spouse Stairs:  2 to get into her home no handrails, 12 inside  has bilateral handrails Has following equipment at home:   PLOF: Independent  PATIENT GOALS: Get rid of  the vertigo   OBJECTIVE:  Note: Objective measures were completed at Evaluation unless otherwise noted.  DIAGNOSTIC FINDINGS:  Via chart CT HEAD 03/14/23: " FINDINGS: Brain: No evidence of acute infarction, hemorrhage, hydrocephalus, extra-axial collection or mass lesion/mass effect. Similar patchy white matter hypodensities are nonspecific but compatible with chronic microvascular ischemic disease.   Vascular: No hyperdense vessel.   Skull: No acute fracture.   Sinuses/Orbits: Clear sinuses.  No acute orbital findings.   Other: No mastoid effusions.   IMPRESSION: No evidence of acute intracranial abnormality.     Electronically Signed   By: Stevenson Elbe M.D.   On: 03/14/2023 12:18"  COGNITION: Overall cognitive status: Within functional limits for tasks assessed   SENSATION: Pt reports occ numbness/tingling in her legs -was present when taking statin   EDEMA:  Reports no swelling    POSTURE:  Rounded shoulders, slight  increase kyphosis/fwd head  Cervical ROM:   AROM screen - formal assessment deferred, assessing to determine if pt has sufficient range of motion for vestibular tests, not for normative values  Rotation bilat WFL for testing - no pain Flex - WFL for testing and pain free Ext - WFL for testing - notices headache (has at baseline) with ext more than flex   STRENGTH: deferred    BED MOBILITY:  Bed mobility can make pt dizzy (rolling, supine>sit)  TRANSFERS: Assistive device utilized: None  Sit to stand: Complete Independence Stand to sit: Complete Independence Chair to chair: Complete Independence   GAIT: Gait pattern: increased variability in BOS with head turns/scanning, scanning impairs gait mechanics with pt becoming slightly unsteady Distance walked: clinic distances Assistive device utilized: None Level of assistance: SBA and CGA   FUNCTIONAL TESTS:  Dynamic Gait Index: 21/24  PATIENT SURVEYS:  DHI deferred to future  visit  VESTIBULAR ASSESSMENT:   OCULOMOTOR EXAM:  Ocular Alignment: normal  Ocular ROM: No Limitations  Spontaneous Nystagmus: absent  Gaze-Induced Nystagmus: absent  Smooth Pursuits: intact - does make pt feel dizzy, described as lightheaded   Saccades:  catch-up saccade (undershooting) with L gaze, corrective saccade (undershooting) looking up - some symptoms but dizziness not as bas as with pursuits   Convergence/Divergence:  sees double 2" from face, converges, some dizziness (described as light-headed)   VESTIBULAR - OCULAR REFLEX:   Slow VOR: Normal - makes pt a little dizzy  VOR Cancellation: Normal - makes pt a little dizzy   Head-Impulse Test: Positive to L with corrective saccade - makes pt dizzy but also movement helps headache; positive to R with undershooting and corrective saccade    POSITIONAL TESTING: deferred to future visit                                                                                                                           TREATMENT DATE: 08/31/23  NMR- Modified Motion Sensitivity Test  Movement Intensity (change from baseline, 0-10, no change=0, severe change=10) Duration  <5 sec = 0  5-10 s = 1 11-20 s = 2 21-30 s = 3 >30 s = 4 Score (Intensity + Duration)  5x Horizontal head turns 1 4   5x Vertical head turns 1  Some sway 4   5x Right diagonal head turns (upper left quadrant down to right) 0 No increased dizziness but throws off balance 0   5x Left diagonal head turns (upper right quadrant down to left) 2 4   5x Trunk Bends (bending knees reaching to floor) 0  No dizziness but some unsteadiness     5x Right quarter body urns (look over right shoulder with trunk rotation, feet planted) 0    5x Left quarter body turns (look over left shoulder with trunk rotation, feet planted) 0    1x 360 degree turn to right 3 Posterior sway 3   1x 360 degree turn to left 5 4  5x VOR cancellation (follow thumbs horizontally with head/trunk  rotation x45 degrees each way) 0 0   TOTAL SCORE      MSQ = total score x (# of positions)/14   0-10 mild range 11-30 moderate range 31-100 severe range   11.8 = moderate    CRM- x 18 min DH: dizziness reported bilaterally. Pt with possible, weak and slow nystagmus that is up-beat & torsional to R when testing R side; no nystagmus with L DH testing Roll test: weak dizziness but no nystagmus observed bilaterally Epley provided to R side 1x   Reviewed post-maneuver precautions (take it easy for rest of day, may be slightly less steady than usual); pt exhibited good balance following maneuver  TA: symptoms modulation, mobility  Seated scapular squeezes 10x  Seated shoulder shrugs 10x Chin tuck (seated) 6x - plan to continue instruction future visits for technique, but stopped continued reps for now due to increased HA symptoms  Seated UT stretch 30 sec bilat - R side feels tighter   Added to HEP and reviewed with pt: Access Code: [email or web address] URL: [email or web address] Date: 08/29/2023 Prepared by: Aminta Kales  Exercises - Seated Scapular Retraction  - 1 x daily - 7 x weekly - 2 sets - 10 reps - Seated Shoulder Shrugs  - 1 x daily - 7 x weekly - 2 sets - 10 reps  PATIENT EDUCATION:  Education details: PT reviews findings of further assessment with pt, update to HEP  Person educated: Patient Education method: Programmer, multimedia, Demonstration, Verbal cues, and Handouts Education comprehension: verbalized understanding, returned demonstration, and needs further education  HOME EXERCISE PROGRAM:   Access Code: [email or web address] URL: [email or web address] Date: 08/29/2023 Prepared by: Aminta Kales  Exercises - Seated Scapular Retraction  - 1 x daily - 7 x weekly - 2 sets - 10 reps - Seated Shoulder Shrugs  - 1 x daily - 7 x weekly - 2 sets - 10 reps  Access Code: [email or web address] URL: [email or web address] Date: 08/27/2023 Prepared by: Aminta Kales  Exercises - Seated Gaze Stabilization with Head Rotation  - 1 x daily - 7 x weekly - 2 sets - 1 reps - 30 seconds hold   GOALS: Goals reviewed with patient? Yes, initiated, to continue with further assessment  SHORT TERM GOALS: Target date: 10/12/2023   Patient will be independent in home exercise program to improve balance, dizziness and mobility for increased QOL and ease with ADLs. Baseline: initiated  Goal status: INITIAL   LONG TERM GOALS: Target date: 11/23/2023    Patient will reduce dizziness handicap inventory score to <50, for less dizziness with ADLs and increased safety with home and work tasks. .  Baseline:  Goal status: INITIAL  2.  Patient will reduce dizziness handicap inventory score to <50, for less dizziness with ADLs and increased safety with home and work tasks.  Baseline:  Goal status: INITIAL  3.  Patient will report at least a 30% reduction in dizzy symptoms with provoking movements since start of PT to indicate increased ease with ADLs and mobility. Baseline: horizontal, vertical head movements, bed mobility and other movements currently make pt dizzy Goal status: INITIAL  4.  Patient will increase dynamic gait index score to at least 23/24 as to demonstrate reduced fall risk and improved dynamic gait balance for better safety with community/home ambulation.   Baseline: 21/24 most difficulty with head turn activities Goal status: INITIAL   ASSESSMENT:  CLINICAL IMPRESSION: Patient is a pleasant 67 y.o. female who was seen today  for physical therapy evaluation and treatment for vertigo also with concerns of imbalance. Further assessment completed. Pt found to have moderate motion sensitivity per MMST. PT also provided R Epley 1x due to possible positive with R Dix-Hallpike. HEP also updated to provided interventions to reduce HA frequency. The pt will benefit from further skilled PT to improve dizziness, gait, balance and mobility in order to  increase ease and safety with ADLs and QOL.  OBJECTIVE IMPAIRMENTS: Abnormal gait, decreased balance, difficulty walking, dizziness, impaired sensation, improper body mechanics, postural dysfunction, and pain.   ACTIVITY LIMITATIONS: bending, stairs, bed mobility, locomotion level, and activities in general can be impacted/impaired if pt having dizzy episode  PARTICIPATION LIMITATIONS: meal prep, cleaning, driving, shopping, community activity, yard work, and ADLs generally impaired it pt having dizzy episode  PERSONAL FACTORS: Age, Sex, Time since onset of injury/illness/exacerbation, and 3+ comorbidities: PMH per chart significant for acute kidney failure, atherosclerotic heart disease of native coronary artery without angina pectoris, chronic kidney disease, congestive heart failure, DM, endometrial intraepithelial neoplasia, enlarged heart, folate deficiency anemia, HTN, OSA, osteonecrosis, osteoporosis, vitamin B12 deficiency, Vitamin D deficiency, migraines please refer to chart for full PMH  are also affecting patient's functional outcome.   REHAB POTENTIAL: Good  CLINICAL DECISION MAKING: Evolving/moderate complexity  EVALUATION COMPLEXITY: Moderate   PLAN:  PT FREQUENCY: 1-2x/week  PT DURATION: 12 weeks  PLANNED INTERVENTIONS: 97164- PT Re-evaluation, 97750- Physical Performance Testing, 97110-Therapeutic exercises, 97530- Therapeutic activity, [id number]- Neuromuscular re-education, 97535- Self Care, [id number]- Manual therapy, [id number]- Gait training, [phone]- Orthotic Initial, [id number]- Orthotic/Prosthetic subsequent, [id number]- Canalith repositioning, [id number]- Electrical stimulation (unattended), [id number]- Electrical stimulation (manual), Patient/Family education, Balance training, Stair training, Dry Needling, Joint mobilization, Spinal mobilization, Vestibular training, DME instructions, Cryotherapy, and Moist heat  PLAN FOR NEXT SESSION:  advance HEP as indicated, balance exercises, cardio if time,  manual, continue plan    Samie Crews, PT 08/31/2023, 9:22 AM

## 2023-09-03 ENCOUNTER — Ambulatory Visit: Admitting: Physical Therapy

## 2023-09-05 ENCOUNTER — Other Ambulatory Visit: Payer: Self-pay | Admitting: Family Medicine

## 2023-09-05 DIAGNOSIS — E118 Type 2 diabetes mellitus with unspecified complications: Secondary | ICD-10-CM

## 2023-09-09 ENCOUNTER — Ambulatory Visit

## 2023-09-09 DIAGNOSIS — M542 Cervicalgia: Secondary | ICD-10-CM

## 2023-09-09 DIAGNOSIS — R42 Dizziness and giddiness: Secondary | ICD-10-CM

## 2023-09-09 NOTE — Therapy (Signed)
 OUTPATIENT PHYSICAL THERAPY VESTIBULAR TREATMENT     Patient Name: Monique Graham MRN: [id number] DOB:May 09, 1956, 68 y.o., female Today's Date: 09/09/2023  END OF SESSION:  PT End of Session - 09/09/23 1623     Visit Number 3    Number of Visits 25    Date for PT Re-Evaluation 11/19/23    PT Start Time 1623    PT Stop Time 1658    PT Time Calculation (min) 35 min    Equipment Utilized During Treatment Gait belt    Activity Tolerance Patient tolerated treatment well    Behavior During Therapy WFL for tasks assessed/performed               Past Medical History:  Diagnosis Date   Acute kidney failure (HCC)    Atherosclerotic heart disease of native coronary artery without angina pectoris    Chronic kidney disease (CKD)    Congestive heart failure (CHF) (HCC)    Coronary atherosclerosis due to calcified coronary lesion    Cyst of left kidney    Diabetes mellitus without complication (HCC)    Diverticulosis of large intestine without perforation or abscess without bleeding    Endometrial intraepithelial neoplasia (EIN)    Enlarged heart    Folate deficiency anemia    Gastric ulcer    Hyperlipidemia    Hypertension    Migraine    Nonerosive esophageal reflux disease    Nonrheumatic mitral (valve) insufficiency    Obstructive sleep apnea    Osteonecrosis (HCC)    Osteoporosis    Other specified disorders of bone density and structure, multiple sites    Ovarian cyst    Polycystic ovary syndrome    Premature ventricular contractions    Restless leg syndrome    Vitamin B12 deficiency    Vitamin D deficiency    Past Surgical History:  Procedure Laterality Date   ABDOMINAL HYSTERECTOMY     OOPHORECTOMY     Patient Active Problem List   Diagnosis Date Noted   Hair loss 06/20/2023   Sleep apnea 06/20/2023   Otalgia, left 04/10/2023   Vertigo 04/10/2023   Class 2 obesity due to excess calories with body mass index (BMI) of 38.0 to 38.9 in adult  04/10/2023   Non-seasonal allergic rhinitis 04/10/2023   Benign hypertensive heart and CKD, 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), w CHF (HCC) 03/11/2023   Class 2 severe obesity due to excess calories with serious comorbidity and body mass index (BMI) of 39.0 to 39.9 in adult (HCC) 03/11/2023   Dyslipidemia due to type 2 diabetes mellitus (HCC) 03/11/2023   Type 2 diabetes mellitus with stage 3a chronic kidney disease, without long-term current use of insulin (HCC) 03/11/2023   Vitamin B12 deficiency 01/30/2023   Cystitis 01/30/2023   Acute right flank pain 01/30/2023   Establishing care with new doctor, encounter for 01/30/2023   Hyperlipidemia LDL goal <100 01/30/2023   Intractable chronic migraine without aura and without status migrainosus 01/30/2023   Hypertension 01/30/2023   Immunization due 01/30/2023    PCP: Melodie Spry, NP REFERRING PROVIDER: Lorane Rocker, PA-C  REFERRING DIAG: vertigo   THERAPY DIAG:  Dizziness and giddiness  Cervicalgia  ONSET DATE: May 2024  Rationale for Evaluation and Treatment: Rehabilitation  SUBJECTIVE:   SUBJECTIVE STATEMENT: Pt doing ok today. She says she's had two dizzy episodes since last seen. One occurred while she was in her car where she felt like she was moving side-to-side. Other episode pt was sitting down, she had  a headache, wasn't moving, experienced very short spinning.  Pt accompanied by: self  PERTINENT HISTORY:  From Eval:  The pt is a pleasant 68 y/o female presenting to PT for vertigo/dizziness and imbalance. Pt reports onset of dizziness started May last year. She was driving and dizziness began out of nowhere. It lasted for 3 minutes at the time. She reports frequency of dizzy episodes has increased a bit more now since last year. She reports a headache and burning in her throat sensation that typically precedes vertigo. She always gets a headache before or during dizzy episodes.  Pt with hx of migraines. She takes ibuprofen or  Tylenol to treat her symptoms. She used to have migraines frequently. She says now headaches seem more sinus-related. Headaches now are more mild and are felt in her sinuses or top of head. She does have ringing in her ears sometimes, sometimes occurs before headache, particularly in R ear, does have pain in L ear.  She reports she has been diagnosed with bilateral hearing loss. She reports her balance is not good and states it is like her equilibrium is off, affects ability to walk up steps, walk around corners. She reports no falls in last 6 months.  She says her legs can hurt at night; this occurred when she took a particular statin. She reports her sleep is not good, she has sleep apnea but has not been using CPAP until yesterday.  PT just moved back to the area and does not have a neurologist here currently. Other information: Pt can have dizziness watching TV, no dizziness looking at her phone, no dizziness in busy stores, no issues with symptoms as passenger in a car. She gets seasick, used to like go fishing, but had to stop.  Pt likes to dance and cook, and she likes to travel.  Treadmills make her dizzy. She goes to the gym, does everything except for treadmill/bikes.  PMH per chart significant for acute kidney failure, atherosclerotic heart disease of native coronary artery without angina pectoris, chronic kidney disease, congestive heart failure, DM, endometrial intraepithelial neoplasia, enlarged heart, folate deficiency anemia, HTN, OSA, osteonecrosis, osteoporosis, vitamin B12 deficiency, Vitamin D deficiency, migraines please refer to chart for full PMH  PAIN:  Are you having pain?  Headache at baseline  PRECAUTIONS: None and Fall  RED FLAGS: None   WEIGHT BEARING RESTRICTIONS: No  FALLS: Has patient fallen in last 6 months? No  LIVING ENVIRONMENT: Lives with: lives with their spouse Stairs:  2 to get into her home no handrails, 12 inside  has bilateral handrails Has following  equipment at home:   PLOF: Independent  PATIENT GOALS: Get rid of the vertigo   OBJECTIVE:  Note: Objective measures were completed at Evaluation unless otherwise noted.  DIAGNOSTIC FINDINGS:  Via chart CT HEAD 03/14/23: " FINDINGS: Brain: No evidence of acute infarction, hemorrhage, hydrocephalus, extra-axial collection or mass lesion/mass effect. Similar patchy white matter hypodensities are nonspecific but compatible with chronic microvascular ischemic disease.   Vascular: No hyperdense vessel.   Skull: No acute fracture.   Sinuses/Orbits: Clear sinuses.  No acute orbital findings.   Other: No mastoid effusions.   IMPRESSION: No evidence of acute intracranial abnormality.     Electronically Signed   By: Stevenson Elbe M.D.   On: 03/14/2023 12:18"  COGNITION: Overall cognitive status: Within functional limits for tasks assessed   SENSATION: Pt reports occ numbness/tingling in her legs -was present when taking statin   EDEMA:  Reports no  swelling    POSTURE:  Rounded shoulders, slight increase kyphosis/fwd head  Cervical ROM:   AROM screen - formal assessment deferred, assessing to determine if pt has sufficient range of motion for vestibular tests, not for normative values  Rotation bilat WFL for testing - no pain Flex - WFL for testing and pain free Ext - WFL for testing - notices headache (has at baseline) with ext more than flex   STRENGTH: deferred    BED MOBILITY:  Bed mobility can make pt dizzy (rolling, supine>sit)  TRANSFERS: Assistive device utilized: None  Sit to stand: Complete Independence Stand to sit: Complete Independence Chair to chair: Complete Independence   GAIT: Gait pattern: increased variability in BOS with head turns/scanning, scanning impairs gait mechanics with pt becoming slightly unsteady Distance walked: clinic distances Assistive device utilized: None Level of assistance: SBA and CGA   FUNCTIONAL TESTS:   Dynamic Gait Index: 21/24  PATIENT SURVEYS:  DHI deferred to future visit  VESTIBULAR ASSESSMENT:   OCULOMOTOR EXAM:  Ocular Alignment: normal  Ocular ROM: No Limitations  Spontaneous Nystagmus: absent  Gaze-Induced Nystagmus: absent  Smooth Pursuits: intact - does make pt feel dizzy, described as lightheaded   Saccades:  catch-up saccade (undershooting) with L gaze, corrective saccade (undershooting) looking up - some symptoms but dizziness not as bas as with pursuits   Convergence/Divergence:  sees double 2" from face, converges, some dizziness (described as light-headed)   VESTIBULAR - OCULAR REFLEX:   Slow VOR: Normal - makes pt a little dizzy  VOR Cancellation: Normal - makes pt a little dizzy   Head-Impulse Test: Positive to L with corrective saccade - makes pt dizzy but also movement helps headache; positive to R with undershooting and corrective saccade    POSITIONAL TESTING: deferred to future visit                                                                                                                           TREATMENT DATE: 09/09/23  NMR: DHI: 28  Manual: Heat applied to bilat shoulder while PT provides manual (no adverse reaction to heat, skin WNL prior to and upon removal of heat) Pt supine on plinth STM to bilat UTs, cervical paraspinals and mm of occipital triangle. HA not worse but did move spots with manual. TrP in L lower cervical paraspinals  TA - for symptom modulation, gentle mobility and strengthening  Chin tuck supine 2x10   Scapular squeezes 15x bliat  Shoulder shrugs 10x bilat   UT stretch 30 sec each side   Cervical rotation stretch 30 sec each side  RTB latex free shoulder rows 10x  RTB latex free horizontal abduction 10x   RTB latex free shoulder ER 10x   PATIENT EDUCATION:  Education details: exercise technique, purpose of interventions Person educated: Patient Education method: Explanation, Demonstration, and Verbal  cues Education comprehension: verbalized understanding, returned demonstration, and needs further education  HOME EXERCISE PROGRAM:   Access Code: [email or web address] URL:  [email or web address] Date: 08/29/2023 Prepared by: Aminta Kales  Exercises - Seated Scapular Retraction  - 1 x daily - 7 x weekly - 2 sets - 10 reps - Seated Shoulder Shrugs  - 1 x daily - 7 x weekly - 2 sets - 10 reps  Access Code: [email or web address] URL: [email or web address] Date: 08/27/2023 Prepared by: Aminta Kales  Exercises - Seated Gaze Stabilization with Head Rotation  - 1 x daily - 7 x weekly - 2 sets - 1 reps - 30 seconds hold   GOALS: Goals reviewed with patient? Yes, initiated, to continue with further assessment  SHORT TERM GOALS: Target date: 10/21/2023   Patient will be independent in home exercise program to improve balance, dizziness and mobility for increased QOL and ease with ADLs. Baseline: initiated  Goal status: INITIAL   LONG TERM GOALS: Target date: 12/02/2023    Patient will reduce dizziness handicap inventory score to <50, for less dizziness with ADLs and increased safety with home and work tasks. .  Baseline:  Goal status: INITIAL  2.  Patient will reduce dizziness handicap inventory score to <50, for less dizziness with ADLs and increased safety with home and work tasks.  Baseline: 4/28: 28 Goal status: INITIAL  3.  Patient will report at least a 30% reduction in dizzy symptoms with provoking movements since start of PT to indicate increased ease with ADLs and mobility. Baseline: horizontal, vertical head movements, bed mobility and other movements currently make pt dizzy Goal status: INITIAL  4.  Patient will increase dynamic gait index score to at least 23/24 as to demonstrate reduced fall risk and improved dynamic gait balance for better safety with community/home ambulation.   Baseline: 21/24 most difficulty with head turn activities Goal status:  INITIAL   ASSESSMENT:  CLINICAL IMPRESSION: DHI complete with score of 28, indicating low perception of handicap. Initiated manual therapy as adjunct to gentle mobility and strengthening as method to reduce HA frequency/intensity over time. Pt did not experience decrease or increase in HA intensity with intervention in session, but did report feeling headache move spots. Pt found to have TrP in lower L cervical paraspinals. The pt will benefit from further skilled PT to improve dizziness, gait, balance and mobility in order to increase ease and safety with ADLs and QOL.  OBJECTIVE IMPAIRMENTS: Abnormal gait, decreased balance, difficulty walking, dizziness, impaired sensation, improper body mechanics, postural dysfunction, and pain.   ACTIVITY LIMITATIONS: bending, stairs, bed mobility, locomotion level, and activities in general can be impacted/impaired if pt having dizzy episode  PARTICIPATION LIMITATIONS: meal prep, cleaning, driving, shopping, community activity, yard work, and ADLs generally impaired it pt having dizzy episode  PERSONAL FACTORS: Age, Sex, Time since onset of injury/illness/exacerbation, and 3+ comorbidities: PMH per chart significant for acute kidney failure, atherosclerotic heart disease of native coronary artery without angina pectoris, chronic kidney disease, congestive heart failure, DM, endometrial intraepithelial neoplasia, enlarged heart, folate deficiency anemia, HTN, OSA, osteonecrosis, osteoporosis, vitamin B12 deficiency, Vitamin D deficiency, migraines please refer to chart for full PMH  are also affecting patient's functional outcome.   REHAB POTENTIAL: Good  CLINICAL DECISION MAKING: Evolving/moderate complexity  EVALUATION COMPLEXITY: Moderate   PLAN:  PT FREQUENCY: 1-2x/week  PT DURATION: 12 weeks  PLANNED INTERVENTIONS: 97164- PT Re-evaluation, 97750- Physical Performance Testing, 97110-Therapeutic exercises, 97530- Therapeutic activity, [id number]-  Neuromuscular re-education, 97535- Self Care, [id number]- Manual therapy, [id number]- Gait training, [id number]- Orthotic Initial, [id number]- Orthotic/Prosthetic subsequent, [phone]- Canalith repositioning, [id number]- Electrical stimulation (unattended), [phone]- Electrical stimulation (manual), Patient/Family education, Balance  training, Stair training, Dry Needling, Joint mobilization, Spinal mobilization, Vestibular training, DME instructions, Cryotherapy, and Moist heat  PLAN FOR NEXT SESSION:  advance HEP as indicated, balance exercises, cardio if time, manual, continue plan    Samie Crews, PT 09/09/2023, 5:04 PM

## 2023-09-12 ENCOUNTER — Ambulatory Visit: Attending: Physician Assistant

## 2023-09-12 DIAGNOSIS — M542 Cervicalgia: Secondary | ICD-10-CM | POA: Insufficient documentation

## 2023-09-12 DIAGNOSIS — R42 Dizziness and giddiness: Secondary | ICD-10-CM | POA: Insufficient documentation

## 2023-09-12 DIAGNOSIS — R2681 Unsteadiness on feet: Secondary | ICD-10-CM | POA: Diagnosis present

## 2023-09-12 NOTE — Therapy (Signed)
 OUTPATIENT PHYSICAL THERAPY VESTIBULAR TREATMENT     Patient Name: Monique Graham MRN: [id number] DOB:[birth year], 68 y.o., female Today's Date: 09/12/2023  END OF SESSION:  PT End of Session - 09/12/23 1303     Visit Number 4    Number of Visits 25    Date for PT Re-Evaluation 11/19/23    PT Start Time 1202    PT Stop Time 1230    PT Time Calculation (min) 28 min    Equipment Utilized During Treatment Gait belt    Activity Tolerance Patient tolerated treatment well    Behavior During Therapy WFL for tasks assessed/performed                Past Medical History:  Diagnosis Date   Acute kidney failure (HCC)    Atherosclerotic heart disease of native coronary artery without angina pectoris    Chronic kidney disease (CKD)    Congestive heart failure (CHF) (HCC)    Coronary atherosclerosis due to calcified coronary lesion    Cyst of left kidney    Diabetes mellitus without complication (HCC)    Diverticulosis of large intestine without perforation or abscess without bleeding    Endometrial intraepithelial neoplasia (EIN)    Enlarged heart    Folate deficiency anemia    Gastric ulcer    Hyperlipidemia    Hypertension    Migraine    Nonerosive esophageal reflux disease    Nonrheumatic mitral (valve) insufficiency    Obstructive sleep apnea    Osteonecrosis (HCC)    Osteoporosis    Other specified disorders of bone density and structure, multiple sites    Ovarian cyst    Polycystic ovary syndrome    Premature ventricular contractions    Restless leg syndrome    Vitamin B12 deficiency    Vitamin D deficiency    Past Surgical History:  Procedure Laterality Date   ABDOMINAL HYSTERECTOMY     OOPHORECTOMY     Patient Active Problem List   Diagnosis Date Noted   Hair loss 06/20/2023   Sleep apnea 06/20/2023   Otalgia, left 04/10/2023   Vertigo 04/10/2023   Class 2 obesity due to excess calories with body mass index (BMI) of 38.0 to 38.9 in adult  04/10/2023   Non-seasonal allergic rhinitis 04/10/2023   Benign hypertensive heart and CKD, 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), w CHF (HCC) 03/11/2023   Class 2 severe obesity due to excess calories with serious comorbidity and body mass index (BMI) of 39.0 to 39.9 in adult (HCC) 03/11/2023   Dyslipidemia due to type 2 diabetes mellitus (HCC) 03/11/2023   Type 2 diabetes mellitus with stage 3a chronic kidney disease, without long-term current use of insulin (HCC) 03/11/2023   Vitamin B12 deficiency 01/30/2023   Cystitis 01/30/2023   Acute right flank pain 01/30/2023   Establishing care with new doctor, encounter for 01/30/2023   Hyperlipidemia LDL goal <100 01/30/2023   Intractable chronic migraine without aura and without status migrainosus 01/30/2023   Hypertension 01/30/2023   Immunization due 01/30/2023    PCP: Melodie Spry, NP REFERRING PROVIDER: Lorane Rocker, PA-C  REFERRING DIAG: vertigo   THERAPY DIAG:  Cervicalgia  Dizziness and giddiness  ONSET DATE: May 2024  Rationale for Evaluation and Treatment: Rehabilitation  SUBJECTIVE:   SUBJECTIVE STATEMENT: Pt reports she doesn't have a headache at the moment, but did earlier today. Pt reports equilibrium feels off but no recent vertigo episodes.  Pt accompanied by: self  PERTINENT HISTORY:  From Eval:  The  pt is a pleasant 68 y/o female presenting to PT for vertigo/dizziness and imbalance. Pt reports onset of dizziness started May last year. She was driving and dizziness began out of nowhere. It lasted for 3 minutes at the time. She reports frequency of dizzy episodes has increased a bit more now since last year. She reports a headache and burning in her throat sensation that typically precedes vertigo. She always gets a headache before or during dizzy episodes.  Pt with hx of migraines. She takes ibuprofen or Tylenol to treat her symptoms. She used to have migraines frequently. She says now headaches seem more sinus-related. Headaches  now are more mild and are felt in her sinuses or top of head. She does have ringing in her ears sometimes, sometimes occurs before headache, particularly in R ear, does have pain in L ear.  She reports she has been diagnosed with bilateral hearing loss. She reports her balance is not good and states it is like her equilibrium is off, affects ability to walk up steps, walk around corners. She reports no falls in last 6 months.  She says her legs can hurt at night; this occurred when she took a particular statin. She reports her sleep is not good, she has sleep apnea but has not been using CPAP until yesterday.  PT just moved back to the area and does not have a neurologist here currently. Other information: Pt can have dizziness watching TV, no dizziness looking at her phone, no dizziness in busy stores, no issues with symptoms as passenger in a car. She gets seasick, used to like go fishing, but had to stop.  Pt likes to dance and cook, and she likes to travel.  Treadmills make her dizzy. She goes to the gym, does everything except for treadmill/bikes.  PMH per chart significant for acute kidney failure, atherosclerotic heart disease of native coronary artery without angina pectoris, chronic kidney disease, congestive heart failure, DM, endometrial intraepithelial neoplasia, enlarged heart, folate deficiency anemia, HTN, OSA, osteonecrosis, osteoporosis, vitamin B12 deficiency, Vitamin D deficiency, migraines please refer to chart for full PMH  PAIN:  Are you having pain?  Headache at baseline  PRECAUTIONS: None and Fall  RED FLAGS: None   WEIGHT BEARING RESTRICTIONS: No  FALLS: Has patient fallen in last 6 months? No  LIVING ENVIRONMENT: Lives with: lives with their spouse Stairs:  2 to get into her home no handrails, 12 inside  has bilateral handrails Has following equipment at home:   PLOF: Independent  PATIENT GOALS: Get rid of the vertigo   OBJECTIVE:  Note: Objective measures were  completed at Evaluation unless otherwise noted.  DIAGNOSTIC FINDINGS:  Via chart CT HEAD 03/14/23: " FINDINGS: Brain: No evidence of acute infarction, hemorrhage, hydrocephalus, extra-axial collection or mass lesion/mass effect. Similar patchy white matter hypodensities are nonspecific but compatible with chronic microvascular ischemic disease.   Vascular: No hyperdense vessel.   Skull: No acute fracture.   Sinuses/Orbits: Clear sinuses.  No acute orbital findings.   Other: No mastoid effusions.   IMPRESSION: No evidence of acute intracranial abnormality.     Electronically Signed   By: Stevenson Elbe M.D.   On: 03/14/2023 12:18"  COGNITION: Overall cognitive status: Within functional limits for tasks assessed   SENSATION: Pt reports occ numbness/tingling in her legs -was present when taking statin   EDEMA:  Reports no swelling    POSTURE:  Rounded shoulders, slight increase kyphosis/fwd head  Cervical ROM:   AROM screen - formal  assessment deferred, assessing to determine if pt has sufficient range of motion for vestibular tests, not for normative values  Rotation bilat WFL for testing - no pain Flex - WFL for testing and pain free Ext - WFL for testing - notices headache (has at baseline) with ext more than flex   STRENGTH: deferred    BED MOBILITY:  Bed mobility can make pt dizzy (rolling, supine>sit)  TRANSFERS: Assistive device utilized: None  Sit to stand: Complete Independence Stand to sit: Complete Independence Chair to chair: Complete Independence   GAIT: Gait pattern: increased variability in BOS with head turns/scanning, scanning impairs gait mechanics with pt becoming slightly unsteady Distance walked: clinic distances Assistive device utilized: None Level of assistance: SBA and CGA   FUNCTIONAL TESTS:  Dynamic Gait Index: 21/24  PATIENT SURVEYS:  DHI deferred to future visit  VESTIBULAR ASSESSMENT:   OCULOMOTOR  EXAM:  Ocular Alignment: normal  Ocular ROM: No Limitations  Spontaneous Nystagmus: absent  Gaze-Induced Nystagmus: absent  Smooth Pursuits: intact - does make pt feel dizzy, described as lightheaded   Saccades:  catch-up saccade (undershooting) with L gaze, corrective saccade (undershooting) looking up - some symptoms but dizziness not as bas as with pursuits   Convergence/Divergence:  sees double 2" from face, converges, some dizziness (described as light-headed)   VESTIBULAR - OCULAR REFLEX:   Slow VOR: Normal - makes pt a little dizzy  VOR Cancellation: Normal - makes pt a little dizzy   Head-Impulse Test: Positive to L with corrective saccade - makes pt dizzy but also movement helps headache; positive to R with undershooting and corrective saccade    POSITIONAL TESTING: deferred to future visit                                                                                                                           TREATMENT DATE: 09/12/23    Manual: Heat applied to bilat shoulder while PT provides manual (no adverse reaction to heat, skin WNL prior to and upon removal of heat) Pt supine on plinth STM to bilat UTs, cervical paraspinals , posterior shoulder mm, and mm of occipital triangle. TrP in L occipital region. Additional TrP release to this area, but no improvement in session. PT also brought pt through long duration UT/levator stretching while providing manual therapy.  PT also provided -Posterior cervical mm stretch x 30 sec -Levator stretch x30 sec bilat   TA - for symptom modulation, gentle mobility and strengthening   Chin tuck supine  15x   Scapular squeezes 15x bliat  Rhomboids stretch x 30 sec   Levator stretch L side x 30 sec   Shoulder rolls cw/cc 10x for each    PATIENT EDUCATION:  Education details: exercise technique Person educated: Patient Education method: Programmer, multimedia, Demonstration, and Verbal cues Education comprehension: verbalized  understanding, returned demonstration, and needs further education  HOME EXERCISE PROGRAM:   Access Code: [email or web address] URL: [email or web address] Date: 08/29/2023 Prepared by: Aminta Kales  Exercises -  Seated Scapular Retraction  - 1 x daily - 7 x weekly - 2 sets - 10 reps - Seated Shoulder Shrugs  - 1 x daily - 7 x weekly - 2 sets - 10 reps  Access Code: [email or web address] URL: [email or web address] Date: 08/27/2023 Prepared by: Aminta Kales  Exercises - Seated Gaze Stabilization with Head Rotation  - 1 x daily - 7 x weekly - 2 sets - 1 reps - 30 seconds hold   GOALS: Goals reviewed with patient? Yes, initiated, to continue with further assessment  SHORT TERM GOALS: Target date: 10/24/2023   Patient will be independent in home exercise program to improve balance, dizziness and mobility for increased QOL and ease with ADLs. Baseline: initiated  Goal status: INITIAL   LONG TERM GOALS: Target date: 12/05/2023    Patient will reduce dizziness handicap inventory score to <50, for less dizziness with ADLs and increased safety with home and work tasks. .  Baseline:  Goal status: INITIAL  2.  Patient will reduce dizziness handicap inventory score to <50, for less dizziness with ADLs and increased safety with home and work tasks.  Baseline: 4/28: 28 Goal status: INITIAL  3.  Patient will report at least a 30% reduction in dizzy symptoms with provoking movements since start of PT to indicate increased ease with ADLs and mobility. Baseline: horizontal, vertical head movements, bed mobility and other movements currently make pt dizzy Goal status: INITIAL  4.  Patient will increase dynamic gait index score to at least 23/24 as to demonstrate reduced fall risk and improved dynamic gait balance for better safety with community/home ambulation.   Baseline: 21/24 most difficulty with head turn activities Goal status: INITIAL   ASSESSMENT:  CLINICAL IMPRESSION: Pt  presented reporting no recent episodes of vertigo and no current headache. Continued with manual therapy, one TrP found L occipital region, but no improvement within session. If pt still without headache during next visit plan to return to gaze stabilization activities and add in cardio training.  The pt will benefit from further skilled PT to improve dizziness, gait, balance and mobility in order to increase ease and safety with ADLs and QOL.  OBJECTIVE IMPAIRMENTS: Abnormal gait, decreased balance, difficulty walking, dizziness, impaired sensation, improper body mechanics, postural dysfunction, and pain.   ACTIVITY LIMITATIONS: bending, stairs, bed mobility, locomotion level, and activities in general can be impacted/impaired if pt having dizzy episode  PARTICIPATION LIMITATIONS: meal prep, cleaning, driving, shopping, community activity, yard work, and ADLs generally impaired it pt having dizzy episode  PERSONAL FACTORS: Age, Sex, Time since onset of injury/illness/exacerbation, and 3+ comorbidities: PMH per chart significant for acute kidney failure, atherosclerotic heart disease of native coronary artery without angina pectoris, chronic kidney disease, congestive heart failure, DM, endometrial intraepithelial neoplasia, enlarged heart, folate deficiency anemia, HTN, OSA, osteonecrosis, osteoporosis, vitamin B12 deficiency, Vitamin D deficiency, migraines please refer to chart for full PMH  are also affecting patient's functional outcome.   REHAB POTENTIAL: Good  CLINICAL DECISION MAKING: Evolving/moderate complexity  EVALUATION COMPLEXITY: Moderate   PLAN:  PT FREQUENCY: 1-2x/week  PT DURATION: 12 weeks  PLANNED INTERVENTIONS: 97164- PT Re-evaluation, 97750- Physical Performance Testing, 97110-Therapeutic exercises, 97530- Therapeutic activity, [id number]- Neuromuscular re-education, 97535- Self Care, 16109- Manual therapy, [id number]- Gait training, [id number]- Orthotic Initial, [phone]-  Orthotic/Prosthetic subsequent, [phone]- Canalith repositioning, [id number]- Electrical stimulation (unattended), [phone]- Electrical stimulation (manual), Patient/Family education, Balance training, Stair training, Dry Needling, Joint mobilization, Spinal mobilization, Vestibular training, DME instructions, Cryotherapy, and Moist heat  PLAN FOR NEXT SESSION:  advance HEP as indicated, balance exercises, cardio if time, manual, return to gaze stabilization if no headache continue plan    Samie Crews, PT 09/12/2023, 1:07 PM

## 2023-09-18 NOTE — Therapy (Signed)
 OUTPATIENT PHYSICAL THERAPY VESTIBULAR TREATMENT     Patient Name: Monique Graham MRN: [id number] DOB:[birth year], 68 y.o., female Today's Date: 09/19/2023  END OF SESSION:   PT End of Session - 09/19/23 1017     Visit Number 5    Number of Visits 25    Date for PT Re-Evaluation 11/19/23    PT Start Time 1017    PT Stop Time 1059    PT Time Calculation (min) 42 min    Equipment Utilized During Treatment Gait belt    Activity Tolerance Patient tolerated treatment well    Behavior During Therapy WFL for tasks assessed/performed             Past Medical History:  Diagnosis Date   Acute kidney failure (HCC)    Atherosclerotic heart disease of native coronary artery without angina pectoris    Chronic kidney disease (CKD)    Congestive heart failure (CHF) (HCC)    Coronary atherosclerosis due to calcified coronary lesion    Cyst of left kidney    Diabetes mellitus without complication (HCC)    Diverticulosis of large intestine without perforation or abscess without bleeding    Endometrial intraepithelial neoplasia (EIN)    Enlarged heart    Folate deficiency anemia    Gastric ulcer    Hyperlipidemia    Hypertension    Migraine    Nonerosive esophageal reflux disease    Nonrheumatic mitral (valve) insufficiency    Obstructive sleep apnea    Osteonecrosis (HCC)    Osteoporosis    Other specified disorders of bone density and structure, multiple sites    Ovarian cyst    Polycystic ovary syndrome    Premature ventricular contractions    Restless leg syndrome    Vitamin B12 deficiency    Vitamin D deficiency    Past Surgical History:  Procedure Laterality Date   ABDOMINAL HYSTERECTOMY     OOPHORECTOMY     Patient Active Problem List   Diagnosis Date Noted   Hair loss 06/20/2023   Sleep apnea 06/20/2023   Otalgia, left 04/10/2023   Vertigo 04/10/2023   Class 2 obesity due to excess calories with body mass index (BMI) of 38.0 to 38.9 in adult 04/10/2023    Non-seasonal allergic rhinitis 04/10/2023   Benign hypertensive heart and CKD, 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), w CHF (HCC) 03/11/2023   Class 2 severe obesity due to excess calories with serious comorbidity and body mass index (BMI) of 39.0 to 39.9 in adult (HCC) 03/11/2023   Dyslipidemia due to type 2 diabetes mellitus (HCC) 03/11/2023   Type 2 diabetes mellitus with stage 3a chronic kidney disease, without long-term current use of insulin (HCC) 03/11/2023   Vitamin B12 deficiency 01/30/2023   Cystitis 01/30/2023   Acute right flank pain 01/30/2023   Establishing care with new doctor, encounter for 01/30/2023   Hyperlipidemia LDL goal <100 01/30/2023   Intractable chronic migraine without aura and without status migrainosus 01/30/2023   Hypertension 01/30/2023   Immunization due 01/30/2023    PCP: Melodie Spry, NP REFERRING PROVIDER: Lorane Rocker, PA-C  REFERRING DIAG: vertigo   THERAPY DIAG:  Cervicalgia  Dizziness and giddiness  Unsteadiness on feet  ONSET DATE: May 2024  Rationale for Evaluation and Treatment: Rehabilitation  SUBJECTIVE:   SUBJECTIVE STATEMENT:   Pt states "today is one of my dizzy days." Pt sates "I wasn't even sure if I was going to be able to make it here." Pt states she did take meclizine , Lopresor,  and clonidine  this morning to help with her symptoms prior to coming to therapy. Pt states she "kind of sort of" has a headache.  States she feels "dizzy" and her "throat is burning."   Pt states this morning when going to get in the shower she had to hold onto the wall due to feeling "off balance" but denies feelings of "vertigo."  Reports she hasn't noticed a significant change in her symptoms following manual therapy the past few sessions.  Pt states she feels well when doing HEP, but then later has symptoms, almost like a delayed response.  Pt states when lying in bed at night if she sees the ceiling fan spinning around then it really causes her to be  dizzy.   Rates current dizziness as 6/10. Pt also reporting she has to drive herself back home today.   Pt reports she hasn't had headaches in a while, is not currently on medications for migraines, was previously on Topamax.   States she moved to [place] last June in 2024.   Later in session, pt reports yesterday when she got up to go to the kitchen she had brief moment of vertigo.  Pt accompanied by: self  PERTINENT HISTORY:  From Eval:  The pt is a pleasant 68 y/o female presenting to PT for vertigo/dizziness and imbalance. Pt reports onset of dizziness started May last year. She was driving and dizziness began out of nowhere. It lasted for 3 minutes at the time. She reports frequency of dizzy episodes has increased a bit more now since last year. She reports a headache and burning in her throat sensation that typically precedes vertigo. She always gets a headache before or during dizzy episodes.  Pt with hx of migraines. She takes ibuprofen or Tylenol to treat her symptoms. She used to have migraines frequently. She says now headaches seem more sinus-related. Headaches now are more mild and are felt in her sinuses or top of head. She does have ringing in her ears sometimes, sometimes occurs before headache, particularly in R ear, does have pain in L ear.  She reports she has been diagnosed with bilateral hearing loss. She reports her balance is not good and states it is like her equilibrium is off, affects ability to walk up steps, walk around corners. She reports no falls in last 6 months.  She says her legs can hurt at night; this occurred when she took a particular statin. She reports her sleep is not good, she has sleep apnea but has not been using CPAP until yesterday.  PT just moved back to the area and does not have a neurologist here currently. Other information: Pt can have dizziness watching TV, no dizziness looking at her phone, no dizziness in busy stores, no issues with symptoms  as passenger in a car. She gets seasick, used to like go fishing, but had to stop.  Pt likes to dance and cook, and she likes to travel.  Treadmills make her dizzy. She goes to the gym, does everything except for treadmill/bikes.  PMH per chart significant for acute kidney failure, atherosclerotic heart disease of native coronary artery without angina pectoris, chronic kidney disease, congestive heart failure, DM, endometrial intraepithelial neoplasia, enlarged heart, folate deficiency anemia, HTN, OSA, osteonecrosis, osteoporosis, vitamin B12 deficiency, Vitamin D deficiency, migraines please refer to chart for full PMH  PAIN:  Are you having pain? Headache at baseline  PRECAUTIONS: None and Fall  RED FLAGS: None   WEIGHT BEARING RESTRICTIONS: No  FALLS: Has patient fallen in last 6 months? No  LIVING ENVIRONMENT: Lives with: lives with their spouse Stairs: 2 to get into her home no handrails, 12 inside has bilateral handrails Has following equipment at home:   PLOF: Independent  PATIENT GOALS: Get rid of the vertigo   OBJECTIVE:  Note: Objective measures were completed at Evaluation unless otherwise noted.  DIAGNOSTIC FINDINGS:  Via chart CT HEAD 03/14/23: " FINDINGS: Brain: No evidence of acute infarction, hemorrhage, hydrocephalus, extra-axial collection or mass lesion/mass effect. Similar patchy white matter hypodensities are nonspecific but compatible with chronic microvascular ischemic disease.   Vascular: No hyperdense vessel.   Skull: No acute fracture.   Sinuses/Orbits: Clear sinuses.  No acute orbital findings.   Other: No mastoid effusions.   IMPRESSION: No evidence of acute intracranial abnormality.     Electronically Signed   By: Stevenson Elbe M.D.   On: 03/14/2023 12:18"  COGNITION: Overall cognitive status: Within functional limits for tasks assessed   SENSATION: Pt reports occ numbness/tingling in her legs -was present when taking statin    EDEMA:  Reports no swelling    POSTURE:  Rounded shoulders, slight increase kyphosis/fwd head  Cervical ROM:   AROM screen - formal assessment deferred, assessing to determine if pt has sufficient range of motion for vestibular tests, not for normative values  Rotation bilat WFL for testing - no pain Flex - WFL for testing and pain free Ext - WFL for testing - notices headache (has at baseline) with ext more than flex   STRENGTH: deferred    BED MOBILITY:  Bed mobility can make pt dizzy (rolling, supine>sit)  TRANSFERS: Assistive device utilized: None  Sit to stand: Complete Independence Stand to sit: Complete Independence Chair to chair: Complete Independence   GAIT: Gait pattern: increased variability in BOS with head turns/scanning, scanning impairs gait mechanics with pt becoming slightly unsteady Distance walked: clinic distances Assistive device utilized: None Level of assistance: SBA and CGA   FUNCTIONAL TESTS:  Dynamic Gait Index: 21/24  PATIENT SURVEYS:  DHI deferred to future visit  VESTIBULAR ASSESSMENT:   OCULOMOTOR EXAM:  Ocular Alignment: normal  Ocular ROM: No Limitations  Spontaneous Nystagmus: absent  Gaze-Induced Nystagmus: absent  Smooth Pursuits: intact - does make pt feel dizzy, described as lightheaded   Saccades: catch-up saccade (undershooting) with L gaze, corrective saccade (undershooting) looking up - some symptoms but dizziness not as bas as with pursuits   Convergence/Divergence:  sees double 2" from face, converges, some dizziness (described as light-headed)   VESTIBULAR - OCULAR REFLEX:   Slow VOR: Normal - makes pt a little dizzy  VOR Cancellation: Normal - makes pt a little dizzy   Head-Impulse Test: Positive to L with corrective saccade - makes pt dizzy but also movement helps headache; positive to R with undershooting and corrective saccade    POSITIONAL TESTING: deferred to future visit  TREATMENT DATE: 09/19/23  Based on pt's increased dizziness symptoms today, pt and therapist determine it is unsafe to participate in further gaze stabilization exercises due to concern for further exacerbating her symptoms, making it unsafe for her to drive home.   Differed continuing with manual therapy at this time given pt reporting minimal impact on her symptoms.   B UE and B LE reciprocal movement pattern on Nustep for cardiac endurance training and generalized strengthening against level 1 resistance for 3 min, increased to level 2 for 2 minute, then level 3 for 3 minutes, totaling 8 minutes. Maintains SPM >70 throughout, totaling 586 steps. Sit>stand from Nustep afterwards requiring CGA for steadying due to pt having some instability.   Therapist providing education to pt on previous BPPV treatment in prior therapy sessions. Educated on plan today to focus on cardiac endurance training to increase HR to promote release of brain growth factor for symptom management.   Educated pt that plan of future sessions to do cardio training with VOR/gaze stabilization exercises when her dizziness is less severe.   Gait training ~139ft with CGA for safety but no overt LOB - does have slow gait speed with slight decrease in natural head movements to visually scan environment during gait.  Pt denies increase in her symptoms following Nustep and gait.   Following additional questioning pt reports she did run out of one of her BP medications, Benicar , yesterday.    Vitals during session:  Sitting after gait: BP 158/98 (MAP 117), HR 81bpm  After sitting rest break: BP 150/90 (MAP 107), HR 80bpm     Educated pt on recommendation to purchase BP cuff to assess vitals daily at home. Educated on importance of following up with pharmacist and physicians regarding getting refill of her BP medication.   Pt denies  increase of her symptoms during session and states feeling comfortable driving home.  PATIENT EDUCATION:  Education details: exercise technique Person educated: Patient Education method: Explanation, Demonstration, and Verbal cues Education comprehension: verbalized understanding, returned demonstration, and needs further education  HOME EXERCISE PROGRAM:   Access Code: [email or web address] URL: [email or web address] Date: 08/29/2023 Prepared by: Aminta Kales  Exercises - Seated Scapular Retraction  - 1 x daily - 7 x weekly - 2 sets - 10 reps - Seated Shoulder Shrugs  - 1 x daily - 7 x weekly - 2 sets - 10 reps  Access Code: [email or web address] URL: [email or web address] Date: 08/27/2023 Prepared by: Aminta Kales  Exercises - Seated Gaze Stabilization with Head Rotation  - 1 x daily - 7 x weekly - 2 sets - 1 reps - 30 seconds hold   GOALS: Goals reviewed with patient? Yes, initiated, to continue with further assessment  SHORT TERM GOALS: Target date: 10/31/2023   Patient will be independent in home exercise program to improve balance, dizziness and mobility for increased QOL and ease with ADLs. Baseline: initiated  Goal status: INITIAL   LONG TERM GOALS: Target date: 12/12/2023    Patient will reduce dizziness handicap inventory score to <50, for less dizziness with ADLs and increased safety with home and work tasks. .  Baseline:  Goal status: INITIAL  2.  Patient will reduce dizziness handicap inventory score to <50, for less dizziness with ADLs and increased safety with home and work tasks.  Baseline: 4/28: 28 Goal status: INITIAL  3.  Patient will report at least a 30% reduction in dizzy symptoms with provoking movements since start of PT to indicate increased ease with ADLs and mobility.  Baseline: horizontal, vertical head movements, bed mobility and other movements currently make pt dizzy Goal status: INITIAL  4.  Patient will increase dynamic gait index  score to at least 23/24 as to demonstrate reduced fall risk and improved dynamic gait balance for better safety with community/home ambulation.   Baseline: 21/24 most difficulty with head turn activities Goal status: INITIAL   ASSESSMENT:  CLINICAL IMPRESSION:  Patient presents with reports of significant dizziness today. Pt reports premedicating with meclizine  prior to therapy session; however, rates dizziness as 6/10 to start session. Due to concerns of further exacerbating patient's symptoms, focused therapy session on patient education and cardiovascular endurance training. Patient reports when performing gaze stabilization exercises at home that often she has delayed response of dizziness. Patient tolerated participation in Nustep and gait training today with pt noted to have slight balance instability when stepping off of Nustep at the end. Patient denies increase in her dizziness symptoms during session. Monique Graham will benefit from further skilled PT to improve dizziness, gait, balance and mobility in order to increase ease and safety with ADLs and QOL.  OBJECTIVE IMPAIRMENTS: Abnormal gait, decreased balance, difficulty walking, dizziness, impaired sensation, improper body mechanics, postural dysfunction, and pain.   ACTIVITY LIMITATIONS: bending, stairs, bed mobility, locomotion level, and activities in general can be impacted/impaired if pt having dizzy episode  PARTICIPATION LIMITATIONS: meal prep, cleaning, driving, shopping, community activity, yard work, and ADLs generally impaired it pt having dizzy episode  PERSONAL FACTORS: Age, Sex, Time since onset of injury/illness/exacerbation, and 3+ comorbidities: PMH per chart significant for acute kidney failure, atherosclerotic heart disease of native coronary artery without angina pectoris, chronic kidney disease, congestive heart failure, DM, endometrial intraepithelial neoplasia, enlarged heart, folate deficiency anemia, HTN, OSA,  osteonecrosis, osteoporosis, vitamin B12 deficiency, Vitamin D deficiency, migraines please refer to chart for full PMH are also affecting patient's functional outcome.   REHAB POTENTIAL: Good  CLINICAL DECISION MAKING: Evolving/moderate complexity  EVALUATION COMPLEXITY: Moderate   PLAN:  PT FREQUENCY: 1-2x/week  PT DURATION: 12 weeks  PLANNED INTERVENTIONS: 97164- PT Re-evaluation, 97750- Physical Performance Testing, 97110-Therapeutic exercises, 97530- Therapeutic activity, [id number]- Neuromuscular re-education, 97535- Self Care, 16109- Manual therapy, [id number]- Gait training, [phone]- Orthotic Initial, [phone]- Orthotic/Prosthetic subsequent, [phone]- Canalith repositioning, [id number]- Electrical stimulation (unattended), [phone]- Electrical stimulation (manual), Patient/Family education, Balance training, Stair training, Dry Needling, Joint mobilization, Spinal mobilization, Vestibular training, DME instructions, Cryotherapy, and Moist heat  PLAN FOR NEXT SESSION:   - re-test positional tests if pt continuing to report episodes of vertigo - check orthostatic hypotension and re-assess BP in general  - follow-up on pt seeing PCP regarding running out of BP medications  - continue cardio conditioning followed by gaze stabilization when appropriate - advance HEP as indicated, balance exercises, cardio if time, manual, return to gaze stabilization if no headache continue plan     Carlen Chasten, PT, DPT, NCS, CSRS Physical Therapist - [facility]  [facility]  11:01 AM 09/19/23

## 2023-09-19 ENCOUNTER — Ambulatory Visit: Admitting: Physical Therapy

## 2023-09-19 DIAGNOSIS — R42 Dizziness and giddiness: Secondary | ICD-10-CM

## 2023-09-19 DIAGNOSIS — M542 Cervicalgia: Secondary | ICD-10-CM | POA: Diagnosis not present

## 2023-09-19 DIAGNOSIS — R2681 Unsteadiness on feet: Secondary | ICD-10-CM

## 2023-09-23 ENCOUNTER — Ambulatory Visit

## 2023-09-23 NOTE — Therapy (Incomplete)
 OUTPATIENT PHYSICAL THERAPY VESTIBULAR TREATMENT     Patient Name: Monique Graham MRN: [id number] DOB:07-Mar-1956, 68 y.o., female Today's Date: 09/23/2023  END OF SESSION:     Past Medical History:  Diagnosis Date   Acute kidney failure (HCC)    Atherosclerotic heart disease of native coronary artery without angina pectoris    Chronic kidney disease (CKD)    Congestive heart failure (CHF) (HCC)    Coronary atherosclerosis due to calcified coronary lesion    Cyst of left kidney    Diabetes mellitus without complication (HCC)    Diverticulosis of large intestine without perforation or abscess without bleeding    Endometrial intraepithelial neoplasia (EIN)    Enlarged heart    Folate deficiency anemia    Gastric ulcer    Hyperlipidemia    Hypertension    Migraine    Nonerosive esophageal reflux disease    Nonrheumatic mitral (valve) insufficiency    Obstructive sleep apnea    Osteonecrosis (HCC)    Osteoporosis    Other specified disorders of bone density and structure, multiple sites    Ovarian cyst    Polycystic ovary syndrome    Premature ventricular contractions    Restless leg syndrome    Vitamin B12 deficiency    Vitamin D deficiency    Past Surgical History:  Procedure Laterality Date   ABDOMINAL HYSTERECTOMY     OOPHORECTOMY     Patient Active Problem List   Diagnosis Date Noted   Hair loss 06/20/2023   Sleep apnea 06/20/2023   Otalgia, left 04/10/2023   Vertigo 04/10/2023   Class 2 obesity due to excess calories with body mass index (BMI) of 38.0 to 38.9 in adult 04/10/2023   Non-seasonal allergic rhinitis 04/10/2023   Benign hypertensive heart and CKD, 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), w CHF (HCC) 03/11/2023   Class 2 severe obesity due to excess calories with serious comorbidity and body mass index (BMI) of 39.0 to 39.9 in adult (HCC) 03/11/2023   Dyslipidemia due to type 2 diabetes mellitus (HCC) 03/11/2023   Type 2 diabetes mellitus with stage 3a  chronic kidney disease, without long-term current use of insulin (HCC) 03/11/2023   Vitamin B12 deficiency 01/30/2023   Cystitis 01/30/2023   Acute right flank pain 01/30/2023   Establishing care with new doctor, encounter for 01/30/2023   Hyperlipidemia LDL goal <100 01/30/2023   Intractable chronic migraine without aura and without status migrainosus 01/30/2023   Hypertension 01/30/2023   Immunization due 01/30/2023    PCP: Melodie Spry, NP REFERRING PROVIDER: Lorane Rocker, PA-C  REFERRING DIAG: vertigo   THERAPY DIAG:  No diagnosis found.  ONSET DATE: May 2024  Rationale for Evaluation and Treatment: Rehabilitation  SUBJECTIVE:   SUBJECTIVE STATEMENT:   Pt states "today is one of my dizzy days." Pt sates "I wasn't even sure if I was going to be able to make it here." Pt states she did take meclizine , Lopresor, and clonidine  this morning to help with her symptoms prior to coming to therapy. Pt states she "kind of sort of" has a headache.  States she feels "dizzy" and her "throat is burning."   Pt states this morning when going to get in the shower she had to hold onto the wall due to feeling "off balance" but denies feelings of "vertigo."  Reports she hasn't noticed a significant change in her symptoms following manual therapy the past few sessions.  Pt states she feels well when doing HEP, but then later has symptoms,  almost like a delayed response.  Pt states when lying in bed at night if she sees the ceiling fan spinning around then it really causes her to be dizzy.   Rates current dizziness as 6/10. Pt also reporting she has to drive herself back home today.   Pt reports she hasn't had headaches in a while, is not currently on medications for migraines, was previously on Topamax.   States she moved to [place] last June in 2024.   Later in session, pt reports yesterday when she got up to go to the kitchen she had brief moment of vertigo.  Pt accompanied by:  self  PERTINENT HISTORY:  From Eval:  The pt is a pleasant 68 y/o female presenting to PT for vertigo/dizziness and imbalance. Pt reports onset of dizziness started May last year. She was driving and dizziness began out of nowhere. It lasted for 3 minutes at the time. She reports frequency of dizzy episodes has increased a bit more now since last year. She reports a headache and burning in her throat sensation that typically precedes vertigo. She always gets a headache before or during dizzy episodes.  Pt with hx of migraines. She takes ibuprofen or Tylenol to treat her symptoms. She used to have migraines frequently. She says now headaches seem more sinus-related. Headaches now are more mild and are felt in her sinuses or top of head. She does have ringing in her ears sometimes, sometimes occurs before headache, particularly in R ear, does have pain in L ear.  She reports she has been diagnosed with bilateral hearing loss. She reports her balance is not good and states it is like her equilibrium is off, affects ability to walk up steps, walk around corners. She reports no falls in last 6 months.  She says her legs can hurt at night; this occurred when she took a particular statin. She reports her sleep is not good, she has sleep apnea but has not been using CPAP until yesterday.  PT just moved back to the area and does not have a neurologist here currently. Other information: Pt can have dizziness watching TV, no dizziness looking at her phone, no dizziness in busy stores, no issues with symptoms as passenger in a car. She gets seasick, used to like go fishing, but had to stop.  Pt likes to dance and cook, and she likes to travel.  Treadmills make her dizzy. She goes to the gym, does everything except for treadmill/bikes.  PMH per chart significant for acute kidney failure, atherosclerotic heart disease of native coronary artery without angina pectoris, chronic kidney disease, congestive heart failure, DM,  endometrial intraepithelial neoplasia, enlarged heart, folate deficiency anemia, HTN, OSA, osteonecrosis, osteoporosis, vitamin B12 deficiency, Vitamin D deficiency, migraines please refer to chart for full PMH  PAIN:  Are you having pain? Headache at baseline  PRECAUTIONS: None and Fall  RED FLAGS: None   WEIGHT BEARING RESTRICTIONS: No  FALLS: Has patient fallen in last 6 months? No  LIVING ENVIRONMENT: Lives with: lives with their spouse Stairs: 2 to get into her home no handrails, 12 inside has bilateral handrails Has following equipment at home:   PLOF: Independent  PATIENT GOALS: Get rid of the vertigo   OBJECTIVE:  Note: Objective measures were completed at Evaluation unless otherwise noted.  DIAGNOSTIC FINDINGS:  Via chart CT HEAD 03/14/23: " FINDINGS: Brain: No evidence of acute infarction, hemorrhage, hydrocephalus, extra-axial collection or mass lesion/mass effect. Similar patchy white matter hypodensities are nonspecific but  compatible with chronic microvascular ischemic disease.   Vascular: No hyperdense vessel.   Skull: No acute fracture.   Sinuses/Orbits: Clear sinuses.  No acute orbital findings.   Other: No mastoid effusions.   IMPRESSION: No evidence of acute intracranial abnormality.     Electronically Signed   By: Stevenson Elbe M.D.   On: 03/14/2023 12:18"  COGNITION: Overall cognitive status: Within functional limits for tasks assessed   SENSATION: Pt reports occ numbness/tingling in her legs -was present when taking statin   EDEMA:  Reports no swelling    POSTURE:  Rounded shoulders, slight increase kyphosis/fwd head  Cervical ROM:   AROM screen - formal assessment deferred, assessing to determine if pt has sufficient range of motion for vestibular tests, not for normative values  Rotation bilat WFL for testing - no pain Flex - WFL for testing and pain free Ext - WFL for testing - notices headache (has at baseline) with  ext more than flex   STRENGTH: deferred    BED MOBILITY:  Bed mobility can make pt dizzy (rolling, supine>sit)  TRANSFERS: Assistive device utilized: None  Sit to stand: Complete Independence Stand to sit: Complete Independence Chair to chair: Complete Independence   GAIT: Gait pattern: increased variability in BOS with head turns/scanning, scanning impairs gait mechanics with pt becoming slightly unsteady Distance walked: clinic distances Assistive device utilized: None Level of assistance: SBA and CGA   FUNCTIONAL TESTS:  Dynamic Gait Index: 21/24  PATIENT SURVEYS:  DHI deferred to future visit  VESTIBULAR ASSESSMENT:   OCULOMOTOR EXAM:  Ocular Alignment: normal  Ocular ROM: No Limitations  Spontaneous Nystagmus: absent  Gaze-Induced Nystagmus: absent  Smooth Pursuits: intact - does make pt feel dizzy, described as lightheaded   Saccades: catch-up saccade (undershooting) with L gaze, corrective saccade (undershooting) looking up - some symptoms but dizziness not as bas as with pursuits   Convergence/Divergence:  sees double 2" from face, converges, some dizziness (described as light-headed)   VESTIBULAR - OCULAR REFLEX:   Slow VOR: Normal - makes pt a little dizzy  VOR Cancellation: Normal - makes pt a little dizzy   Head-Impulse Test: Positive to L with corrective saccade - makes pt dizzy but also movement helps headache; positive to R with undershooting and corrective saccade    POSITIONAL TESTING: deferred to future visit                                                                                                                           TREATMENT DATE: 09/23/23   Repeat Dix-Hallpike and Roll test today if pt able  Nustep cardio training   UE MMT  Circuit/UE training  Gaze stabilization if not too dizzy   PREVIOUS  Based on pt's increased dizziness symptoms today, pt and therapist determine it is unsafe to participate in further gaze  stabilization exercises due to concern for further exacerbating her symptoms, making it unsafe for her to drive home.   Differed continuing  with manual therapy at this time given pt reporting minimal impact on her symptoms.   B UE and B LE reciprocal movement pattern on Nustep for cardiac endurance training and generalized strengthening against level 1 resistance for 3 min, increased to level 2 for 2 minute, then level 3 for 3 minutes, totaling 8 minutes. Maintains SPM >70 throughout, totaling 586 steps. Sit>stand from Nustep afterwards requiring CGA for steadying due to pt having some instability.   Therapist providing education to pt on previous BPPV treatment in prior therapy sessions. Educated on plan today to focus on cardiac endurance training to increase HR to promote release of brain growth factor for symptom management.   Educated pt that plan of future sessions to do cardio training with VOR/gaze stabilization exercises when her dizziness is less severe.   Gait training ~123ft with CGA for safety but no overt LOB - does have slow gait speed with slight decrease in natural head movements to visually scan environment during gait.  Pt denies increase in her symptoms following Nustep and gait.   Following additional questioning pt reports she did run out of one of her BP medications, Benicar , yesterday.    Vitals during session:  Sitting after gait: BP 158/98 (MAP 117), HR 81bpm  After sitting rest break: BP 150/90 (MAP 107), HR 80bpm     Educated pt on recommendation to purchase BP cuff to assess vitals daily at home. Educated on importance of following up with pharmacist and physicians regarding getting refill of her BP medication.   Pt denies increase of her symptoms during session and states feeling comfortable driving home.  PATIENT EDUCATION:  Education details: exercise technique Person educated: Patient Education method: Explanation, Demonstration, and Verbal  cues Education comprehension: verbalized understanding, returned demonstration, and needs further education  HOME EXERCISE PROGRAM:   Access Code: [email or web address] URL: [email or web address] Date: 08/29/2023 Prepared by: Aminta Kales  Exercises - Seated Scapular Retraction  - 1 x daily - 7 x weekly - 2 sets - 10 reps - Seated Shoulder Shrugs  - 1 x daily - 7 x weekly - 2 sets - 10 reps  Access Code: [email or web address] URL: [email or web address] Date: 08/27/2023 Prepared by: Aminta Kales  Exercises - Seated Gaze Stabilization with Head Rotation  - 1 x daily - 7 x weekly - 2 sets - 1 reps - 30 seconds hold   GOALS: Goals reviewed with patient? Yes, initiated, to continue with further assessment  SHORT TERM GOALS: Target date: 11/04/2023   Patient will be independent in home exercise program to improve balance, dizziness and mobility for increased QOL and ease with ADLs. Baseline: initiated  Goal status: INITIAL   LONG TERM GOALS: Target date: 12/16/2023    Patient will reduce dizziness handicap inventory score to <50, for less dizziness with ADLs and increased safety with home and work tasks. .  Baseline:  Goal status: INITIAL  2.  Patient will reduce dizziness handicap inventory score to <50, for less dizziness with ADLs and increased safety with home and work tasks.  Baseline: 4/28: 28 Goal status: INITIAL  3.  Patient will report at least a 30% reduction in dizzy symptoms with provoking movements since start of PT to indicate increased ease with ADLs and mobility. Baseline: horizontal, vertical head movements, bed mobility and other movements currently make pt dizzy Goal status: INITIAL  4.  Patient will increase dynamic gait index score to at least 23/24 as to demonstrate reduced fall risk and improved dynamic gait balance for better safety  with community/home ambulation.   Baseline: 21/24 most difficulty with head turn activities Goal status:  INITIAL   ASSESSMENT:  CLINICAL IMPRESSION:  Patient presents with reports of significant dizziness today. Pt reports premedicating with meclizine  prior to therapy session; however, rates dizziness as 6/10 to start session. Due to concerns of further exacerbating patient's symptoms, focused therapy session on patient education and cardiovascular endurance training. Patient reports when performing gaze stabilization exercises at home that often she has delayed response of dizziness. Patient tolerated participation in Nustep and gait training today with pt noted to have slight balance instability when stepping off of Nustep at the end. Patient denies increase in her dizziness symptoms during session. Ms. Covin will benefit from further skilled PT to improve dizziness, gait, balance and mobility in order to increase ease and safety with ADLs and QOL.  OBJECTIVE IMPAIRMENTS: Abnormal gait, decreased balance, difficulty walking, dizziness, impaired sensation, improper body mechanics, postural dysfunction, and pain.   ACTIVITY LIMITATIONS: bending, stairs, bed mobility, locomotion level, and activities in general can be impacted/impaired if pt having dizzy episode  PARTICIPATION LIMITATIONS: meal prep, cleaning, driving, shopping, community activity, yard work, and ADLs generally impaired it pt having dizzy episode  PERSONAL FACTORS: Age, Sex, Time since onset of injury/illness/exacerbation, and 3+ comorbidities: PMH per chart significant for acute kidney failure, atherosclerotic heart disease of native coronary artery without angina pectoris, chronic kidney disease, congestive heart failure, DM, endometrial intraepithelial neoplasia, enlarged heart, folate deficiency anemia, HTN, OSA, osteonecrosis, osteoporosis, vitamin B12 deficiency, Vitamin D deficiency, migraines please refer to chart for full PMH are also affecting patient's functional outcome.   REHAB POTENTIAL: Good  CLINICAL DECISION MAKING:  Evolving/moderate complexity  EVALUATION COMPLEXITY: Moderate   PLAN:  PT FREQUENCY: 1-2x/week  PT DURATION: 12 weeks  PLANNED INTERVENTIONS: 97164- PT Re-evaluation, 97750- Physical Performance Testing, 97110-Therapeutic exercises, 97530- Therapeutic activity, 97112- Neuromuscular re-education, 97535- Self Care, [id number]- Manual therapy, [phone]- Gait training, [phone]- Orthotic Initial, [phone]- Orthotic/Prosthetic subsequent, [phone]- Canalith repositioning, [id number]- Electrical stimulation (unattended), [phone]- Electrical stimulation (manual), Patient/Family education, Balance training, Stair training, Dry Needling, Joint mobilization, Spinal mobilization, Vestibular training, DME instructions, Cryotherapy, and Moist heat  PLAN FOR NEXT SESSION:   - re-test positional tests if pt continuing to report episodes of vertigo - check orthostatic hypotension and re-assess BP in general  - follow-up on pt seeing PCP regarding running out of BP medications  - continue cardio conditioning followed by gaze stabilization when appropriate - advance HEP as indicated, balance exercises, cardio if time, manual, return to gaze stabilization if no headache continue plan     Aminta Kales PT, DPT  Physical Therapist - [facility]  1:07 PM 09/23/23

## 2023-09-24 ENCOUNTER — Other Ambulatory Visit (INDEPENDENT_AMBULATORY_CARE_PROVIDER_SITE_OTHER): Payer: Self-pay | Admitting: Physician Assistant

## 2023-09-26 ENCOUNTER — Ambulatory Visit: Admitting: Physical Therapy

## 2023-09-26 DIAGNOSIS — R2681 Unsteadiness on feet: Secondary | ICD-10-CM

## 2023-09-26 DIAGNOSIS — M542 Cervicalgia: Secondary | ICD-10-CM

## 2023-09-26 DIAGNOSIS — R42 Dizziness and giddiness: Secondary | ICD-10-CM

## 2023-09-26 NOTE — Therapy (Signed)
 OUTPATIENT PHYSICAL THERAPY VESTIBULAR TREATMENT     Patient Name: Monique Graham MRN: [id number] DOB:1956/04/05, 68 y.o., female Today's Date: 09/26/2023  END OF SESSION:   PT End of Session - 09/26/23 1150     Visit Number 6    Number of Visits 25    Date for PT Re-Evaluation 11/19/23    PT Start Time 1152    PT Stop Time 1236    PT Time Calculation (min) 44 min    Equipment Utilized During Treatment Gait belt    Activity Tolerance Patient tolerated treatment well    Behavior During Therapy WFL for tasks assessed/performed              Past Medical History:  Diagnosis Date   Acute kidney failure (HCC)    Atherosclerotic heart disease of native coronary artery without angina pectoris    Chronic kidney disease (CKD)    Congestive heart failure (CHF) (HCC)    Coronary atherosclerosis due to calcified coronary lesion    Cyst of left kidney    Diabetes mellitus without complication (HCC)    Diverticulosis of large intestine without perforation or abscess without bleeding    Endometrial intraepithelial neoplasia (EIN)    Enlarged heart    Folate deficiency anemia    Gastric ulcer    Hyperlipidemia    Hypertension    Migraine    Nonerosive esophageal reflux disease    Nonrheumatic mitral (valve) insufficiency    Obstructive sleep apnea    Osteonecrosis (HCC)    Osteoporosis    Other specified disorders of bone density and structure, multiple sites    Ovarian cyst    Polycystic ovary syndrome    Premature ventricular contractions    Restless leg syndrome    Vitamin B12 deficiency    Vitamin D deficiency    Past Surgical History:  Procedure Laterality Date   ABDOMINAL HYSTERECTOMY     OOPHORECTOMY     Patient Active Problem List   Diagnosis Date Noted   Hair loss 06/20/2023   Sleep apnea 06/20/2023   Otalgia, left 04/10/2023   Vertigo 04/10/2023   Class 2 obesity due to excess calories with body mass index (BMI) of 38.0 to 38.9 in adult  04/10/2023   Non-seasonal allergic rhinitis 04/10/2023   Benign hypertensive heart and CKD, 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), w CHF (HCC) 03/11/2023   Class 2 severe obesity due to excess calories with serious comorbidity and body mass index (BMI) of 39.0 to 39.9 in adult (HCC) 03/11/2023   Dyslipidemia due to type 2 diabetes mellitus (HCC) 03/11/2023   Type 2 diabetes mellitus with stage 3a chronic kidney disease, without long-term current use of insulin (HCC) 03/11/2023   Vitamin B12 deficiency 01/30/2023   Cystitis 01/30/2023   Acute right flank pain 01/30/2023   Establishing care with new doctor, encounter for 01/30/2023   Hyperlipidemia LDL goal <100 01/30/2023   Intractable chronic migraine without aura and without status migrainosus 01/30/2023   Hypertension 01/30/2023   Immunization due 01/30/2023    PCP: Melodie Spry, NP REFERRING PROVIDER: Lorane Rocker, PA-C  REFERRING DIAG: vertigo   THERAPY DIAG:  Cervicalgia  Dizziness and giddiness  Unsteadiness on feet  ONSET DATE: May 2024  Rationale for Evaluation and Treatment: Rehabilitation  SUBJECTIVE:   SUBJECTIVE STATEMENT:   Pt states her dizziness is "better" today (rated as 2/10). States she did have an episode 2 nights ago where she was sitting down watching TV and when she started to stand  everything "went spinning."    States she has been doing her HEP and feels it may be helping.   Pt states she does believe her dizziness may be "neurological" because her "headaches" come on with "burning" in her throat prior to the dizziness similar to an "aura" for migraines.   States she has been taking clonidine  and lopressor for her BP. Pt states she hasn't been able to get her BP med she ran out of yet, but plans to follow up with MD or pharmacy.   From prior sessions: - pt on meclizine  for dizziness PRN - experiences "throat burning" with her dizziness - can have delayed dizziness response - when lying in bed at night if she  sees the ceiling fan spinning around then it really causes her to be dizzy.  - Pt is not currently on medications for migraines, was previously on Topamax   - moved to [facility] last June 2024.    Pt accompanied by: self  PERTINENT HISTORY:  From Eval:  The pt is a pleasant 68 y/o female presenting to PT for vertigo/dizziness and imbalance. Pt reports onset of dizziness started May last year. She was driving and dizziness began out of nowhere. It lasted for 3 minutes at the time. She reports frequency of dizzy episodes has increased a bit more now since last year. She reports a headache and burning in her throat sensation that typically precedes vertigo. She always gets a headache before or during dizzy episodes.  Pt with hx of migraines. She takes ibuprofen or Tylenol to treat her symptoms. She used to have migraines frequently. She says now headaches seem more sinus-related. Headaches now are more mild and are felt in her sinuses or top of head. She does have ringing in her ears sometimes, sometimes occurs before headache, particularly in R ear, does have pain in L ear.  She reports she has been diagnosed with bilateral hearing loss. She reports her balance is not good and states it is like her equilibrium is off, affects ability to walk up steps, walk around corners. She reports no falls in last 6 months.  She says her legs can hurt at night; this occurred when she took a particular statin. She reports her sleep is not good, she has sleep apnea but has not been using CPAP until yesterday.  PT just moved back to the area and does not have a neurologist here currently. Other information: Pt can have dizziness watching TV, no dizziness looking at her phone, no dizziness in busy stores, no issues with symptoms as passenger in a car. She gets seasick, used to like go fishing, but had to stop.  Pt likes to dance and cook, and she likes to travel.  Treadmills make her dizzy. She goes to the gym, does  everything except for treadmill/bikes.  PMH per chart significant for acute kidney failure, atherosclerotic heart disease of native coronary artery without angina pectoris, chronic kidney disease, congestive heart failure, DM, endometrial intraepithelial neoplasia, enlarged heart, folate deficiency anemia, HTN, OSA, osteonecrosis, osteoporosis, vitamin B12 deficiency, Vitamin D deficiency, migraines please refer to chart for full PMH  PAIN:  Are you having pain? Headache at baseline  PRECAUTIONS: None and Fall  RED FLAGS: None   WEIGHT BEARING RESTRICTIONS: No  FALLS: Has patient fallen in last 6 months? No  LIVING ENVIRONMENT: Lives with: lives with their spouse Stairs: 2 to get into her home no handrails, 12 inside has bilateral handrails Has following equipment at home:  PLOF: Independent  PATIENT GOALS: Get rid of the vertigo   OBJECTIVE:  Note: Objective measures were completed at Evaluation unless otherwise noted.  DIAGNOSTIC FINDINGS:  Via chart CT HEAD 03/14/23: " FINDINGS: Brain: No evidence of acute infarction, hemorrhage, hydrocephalus, extra-axial collection or mass lesion/mass effect. Similar patchy white matter hypodensities are nonspecific but compatible with chronic microvascular ischemic disease.   Vascular: No hyperdense vessel.   Skull: No acute fracture.   Sinuses/Orbits: Clear sinuses.  No acute orbital findings.   Other: No mastoid effusions.   IMPRESSION: No evidence of acute intracranial abnormality.     Electronically Signed   By: Stevenson Elbe M.D.   On: 03/14/2023 12:18"  COGNITION: Overall cognitive status: Within functional limits for tasks assessed   SENSATION: Pt reports occ numbness/tingling in her legs -was present when taking statin   EDEMA:  Reports no swelling    POSTURE:  Rounded shoulders, slight increase kyphosis/fwd head  Cervical ROM:   AROM screen - formal assessment deferred, assessing to determine if  pt has sufficient range of motion for vestibular tests, not for normative values  Rotation bilat WFL for testing - no pain Flex - WFL for testing and pain free Ext - WFL for testing - notices headache (has at baseline) with ext more than flex   STRENGTH: deferred    BED MOBILITY:  Bed mobility can make pt dizzy (rolling, supine>sit)  TRANSFERS: Assistive device utilized: None  Sit to stand: Complete Independence Stand to sit: Complete Independence Chair to chair: Complete Independence   GAIT: Gait pattern: increased variability in BOS with head turns/scanning, scanning impairs gait mechanics with pt becoming slightly unsteady Distance walked: clinic distances Assistive device utilized: None Level of assistance: SBA and CGA   FUNCTIONAL TESTS:  Dynamic Gait Index: 21/24  PATIENT SURVEYS:  DHI deferred to future visit  VESTIBULAR ASSESSMENT:   OCULOMOTOR EXAM:  Ocular Alignment: normal  Ocular ROM: No Limitations  Spontaneous Nystagmus: absent  Gaze-Induced Nystagmus: absent  Smooth Pursuits: intact - does make pt feel dizzy, described as lightheaded   Saccades: catch-up saccade (undershooting) with L gaze, corrective saccade (undershooting) looking up - some symptoms but dizziness not as bas as with pursuits   Convergence/Divergence:  sees double 2" from face, converges, some dizziness (described as light-headed)   VESTIBULAR - OCULAR REFLEX:   Slow VOR: Normal - makes pt a little dizzy  VOR Cancellation: Normal - makes pt a little dizzy   Head-Impulse Test: Positive to L with corrective saccade - makes pt dizzy but also movement helps headache; positive to R with undershooting and corrective saccade    POSITIONAL TESTING: deferred to future visit                                                                                                                           TREATMENT DATE: 09/26/23  Seated BP at beginning of session: BP 140/81 (MAP 98), HR 75bpm     Educated  on Borg RPE scale and gradually build up to working at a 5/10 "hard" level when she is exercises on a recumbent bike for overall cardiovascular training. Educated pt on current goal of exercising 3x/week and increasing from there.   Educated pt on BPPV vs vestibular migraine vs orthostatic hypotension.  Seated VBI (vertebrobasilar insufficiency) test with pt reporting tinnitis in R ear when looking towards the RIGHT and slight dizziness symptoms when looking to the LEFT.   Pt reports she has previously been to cardiologist and was told she had "calcification" in the arteries of her neck with pt gesturing towards front of her neck, but she was unable to recall details of the findings.    Positional Testing:  R Sidelying: negative for nystagmus and symptoms L sidelying: negative for nystagmus and symptoms Pt reports onset of 5/10 headache with these positional testings Provided pt with cool wash cloth for symptom management  Performed orthostatic BP assessment:  Supine: BP 130/78 (MAP 93), HR 71bpm  Standing: BP 117/77 (MAP 91), HR 78bpm  *SBP drop by 13 and pt reporting mild dizziness   Educated pt on importance of increasing daily water intake as pt states she has a hx of severe dehydration to the point she had behavior changes. Pt states she drinks around 1 bottle (16oz) of water daily, but otherwise drinks coffee, tea, or soda.   Educated pt on:  Prioritizing arranging follow-up with cardiologist regarding BP and potential decreased blood flow in arteries per pt report  Getting set-up with neurologist to help manage potential vestibular migraines.   Aaron Aas  PATIENT EDUCATION:  Education details: exercise technique Person educated: Patient Education method: Explanation, Demonstration, and Verbal cues Education comprehension: verbalized understanding, returned demonstration, and needs further education  HOME EXERCISE PROGRAM:   Access Code: [email or web address] URL:  [email or web address] Date: 08/29/2023 Prepared by: Aminta Kales  Exercises - Seated Scapular Retraction  - 1 x daily - 7 x weekly - 2 sets - 10 reps - Seated Shoulder Shrugs  - 1 x daily - 7 x weekly - 2 sets - 10 reps  Access Code: [email or web address] URL: [email or web address] Date: 08/27/2023 Prepared by: Aminta Kales  Exercises - Seated Gaze Stabilization with Head Rotation  - 1 x daily - 7 x weekly - 2 sets - 1 reps - 30 seconds hold   GOALS: Goals reviewed with patient? Yes, initiated, to continue with further assessment  SHORT TERM GOALS: Target date: 11/07/2023   Patient will be independent in home exercise program to improve balance, dizziness and mobility for increased QOL and ease with ADLs. Baseline: initiated  Goal status: INITIAL   LONG TERM GOALS: Target date: 12/19/2023    Patient will reduce dizziness handicap inventory score to <50, for less dizziness with ADLs and increased safety with home and work tasks. .  Baseline: 09/09/2023: 28 Goal status: INITIAL  2.  Patient will report at least a 30% reduction in dizzy symptoms with provoking movements since start of PT to indicate increased ease with ADLs and mobility. Baseline: horizontal, vertical head movements, bed mobility and other movements currently make pt dizzy Goal status: INITIAL  3.  Patient will increase dynamic gait index score to at least 23/24 as to demonstrate reduced fall risk and improved dynamic gait balance for better safety with community/home ambulation.   Baseline: 21/24 most difficulty with head turn activities Goal status: INITIAL   ASSESSMENT:  CLINICAL IMPRESSION:  Patient reports having a good day overall with no significant dizziness noted. Therapy session  focused on continued assessment to determine potential causes of pt's dizziness symptoms. Pt continues to report feeling a "burning" sensation in her throat and a headache that precedes her dizziness, indicating  likely vestibular migraine. However, pt also reports a few instances of becoming dizzy when coming to stand; therefore, assessed orthostatic hypotension with pt having SBP drop by 13 and having mild symptoms; therefore, could be a contributor. Therapist re-assessed R and L sidelying tests for posterior canal BPPV with them both negative, but pt experiencing onset of headache. Therapist educated pt on importance of establishing follow-up with cardiology given hx of potential decreased blood flow in arteries of her neck (per pt report). Recommend continuing with cardiovascular training and gaze stabilization exercises in future sessions to manage dizziness symptoms (remembering pt often experiences delayed symptoms). Ms. Motamedi will benefit from further skilled PT to improve dizziness, gait, balance and mobility in order to increase ease and safety with ADLs and QOL.  OBJECTIVE IMPAIRMENTS: Abnormal gait, decreased balance, difficulty walking, dizziness, impaired sensation, improper body mechanics, postural dysfunction, and pain.   ACTIVITY LIMITATIONS: bending, stairs, bed mobility, locomotion level, and activities in general can be impacted/impaired if pt having dizzy episode  PARTICIPATION LIMITATIONS: meal prep, cleaning, driving, shopping, community activity, yard work, and ADLs generally impaired it pt having dizzy episode  PERSONAL FACTORS: Age, Sex, Time since onset of injury/illness/exacerbation, and 3+ comorbidities: PMH per chart significant for acute kidney failure, atherosclerotic heart disease of native coronary artery without angina pectoris, chronic kidney disease, congestive heart failure, DM, endometrial intraepithelial neoplasia, enlarged heart, folate deficiency anemia, HTN, OSA, osteonecrosis, osteoporosis, vitamin B12 deficiency, Vitamin D deficiency, migraines please refer to chart for full PMH are also affecting patient's functional outcome.   REHAB POTENTIAL: Good  CLINICAL  DECISION MAKING: Evolving/moderate complexity  EVALUATION COMPLEXITY: Moderate   PLAN:  PT FREQUENCY: 1-2x/week  PT DURATION: 12 weeks  PLANNED INTERVENTIONS: 97164- PT Re-evaluation, 97750- Physical Performance Testing, 97110-Therapeutic exercises, 97530- Therapeutic activity, [id number]- Neuromuscular re-education, 97535- Self Care, 16109- Manual therapy, [id number]- Gait training, [phone]- Orthotic Initial, [phone]- Orthotic/Prosthetic subsequent, [phone]- Canalith repositioning, [id number]- Electrical stimulation (unattended), [phone]- Electrical stimulation (manual), Patient/Family education, Balance training, Stair training, Dry Needling, Joint mobilization, Spinal mobilization, Vestibular training, DME instructions, Cryotherapy, and Moist heat  PLAN FOR NEXT SESSION:   - follow-up on recommendations for: - increased daily water intake - set-up with cardiologist for BP management and follow-up pt reports of "calcifications" in arteries of her neck - recheck orthostatic hypotension if symptomatic  - SBP dropped by 13 on 09/26/2023 - continue cardio conditioning followed by gaze stabilization  - reinforce education on working at 5/10 on Borg RPE scale - advance HEP as indicated - balance interventions  Chyla Schlender, PT, DPT, NCS, CSRS Physical Therapist - [facility]  [facility]  12:38 PM 09/26/23

## 2023-10-01 ENCOUNTER — Ambulatory Visit: Admitting: Physical Therapy

## 2023-10-01 ENCOUNTER — Other Ambulatory Visit: Payer: Self-pay | Admitting: Family Medicine

## 2023-10-01 DIAGNOSIS — R42 Dizziness and giddiness: Secondary | ICD-10-CM

## 2023-10-01 DIAGNOSIS — R2681 Unsteadiness on feet: Secondary | ICD-10-CM

## 2023-10-01 DIAGNOSIS — M542 Cervicalgia: Secondary | ICD-10-CM | POA: Diagnosis not present

## 2023-10-01 MED ORDER — OLMESARTAN MEDOXOMIL 40 MG PO TABS: 40.0000 mg | ORAL_TABLET | Freq: Every day | ORAL | 1 refills | Status: DC

## 2023-10-01 NOTE — Telephone Encounter (Signed)
 Copied from CRM [id number]. Topic: Clinical - Medication Refill >> Oct 01, 2023  3:12 PM Carlatta H wrote: Medication:  olmesartan  (BENICAR ) 40 MG tablet [[id number]]  Has the patient contacted their pharmacy? No (Agent: If no, request that the patient contact the pharmacy for the refill. If patient does not wish to contact the pharmacy document the reason why and proceed with request.) (Agent: If yes, when and what did the pharmacy advise?)  This is the patient's preferred pharmacy:   [id number] - [place], [place] - [address] [place] [address] Phone: [phone] Fax: [phone]  Is this the correct pharmacy for this prescription? No If no, delete pharmacy and type the correct one.   Has the prescription been filled recently? No  Is the patient out of the medication? Yes  Has the patient been seen for an appointment in the last year OR does the patient have an upcoming appointment? Yes  Can we respond through MyChart? Yes  Agent: Please be advised that Rx refills may take up to 3 business days. We ask that you follow-up with your pharmacy.

## 2023-10-01 NOTE — Therapy (Signed)
 OUTPATIENT PHYSICAL THERAPY VESTIBULAR TREATMENT     Patient Name: Monique Graham MRN: [id number] DOB:May 28, 1955, 68 y.o., female Today's Date: 10/01/2023  END OF SESSION:   PT End of Session - 10/01/23 1451     Visit Number 7    Number of Visits 25    Date for PT Re-Evaluation 11/19/23    PT Start Time 1450    PT Stop Time 1518    PT Time Calculation (min) 28 min    Equipment Utilized During Treatment Gait belt    Activity Tolerance Patient tolerated treatment well    Behavior During Therapy WFL for tasks assessed/performed               Past Medical History:  Diagnosis Date   Acute kidney failure (HCC)    Atherosclerotic heart disease of native coronary artery without angina pectoris    Chronic kidney disease (CKD)    Congestive heart failure (CHF) (HCC)    Coronary atherosclerosis due to calcified coronary lesion    Cyst of left kidney    Diabetes mellitus without complication (HCC)    Diverticulosis of large intestine without perforation or abscess without bleeding    Endometrial intraepithelial neoplasia (EIN)    Enlarged heart    Folate deficiency anemia    Gastric ulcer    Hyperlipidemia    Hypertension    Migraine    Nonerosive esophageal reflux disease    Nonrheumatic mitral (valve) insufficiency    Obstructive sleep apnea    Osteonecrosis (HCC)    Osteoporosis    Other specified disorders of bone density and structure, multiple sites    Ovarian cyst    Polycystic ovary syndrome    Premature ventricular contractions    Restless leg syndrome    Vitamin B12 deficiency    Vitamin D deficiency    Past Surgical History:  Procedure Laterality Date   ABDOMINAL HYSTERECTOMY     OOPHORECTOMY     Patient Active Problem List   Diagnosis Date Noted   Hair loss 06/20/2023   Sleep apnea 06/20/2023   Otalgia, left 04/10/2023   Vertigo 04/10/2023   Class 2 obesity due to excess calories with body mass index (BMI) of 38.0 to 38.9 in adult  04/10/2023   Non-seasonal allergic rhinitis 04/10/2023   Benign hypertensive heart and CKD, 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), w CHF (HCC) 03/11/2023   Class 2 severe obesity due to excess calories with serious comorbidity and body mass index (BMI) of 39.0 to 39.9 in adult (HCC) 03/11/2023   Dyslipidemia due to type 2 diabetes mellitus (HCC) 03/11/2023   Type 2 diabetes mellitus with stage 3a chronic kidney disease, without long-term current use of insulin (HCC) 03/11/2023   Vitamin B12 deficiency 01/30/2023   Cystitis 01/30/2023   Acute right flank pain 01/30/2023   Establishing care with new doctor, encounter for 01/30/2023   Hyperlipidemia LDL goal <100 01/30/2023   Intractable chronic migraine without aura and without status migrainosus 01/30/2023   Hypertension 01/30/2023   Immunization due 01/30/2023    PCP: Melodie Spry, NP REFERRING PROVIDER: Lorane Rocker, PA-C  REFERRING DIAG: vertigo   THERAPY DIAG:  Cervicalgia  Dizziness and giddiness  Unsteadiness on feet  ONSET DATE: May 2024  Rationale for Evaluation and Treatment: Rehabilitation  SUBJECTIVE:   SUBJECTIVE STATEMENT:   Pt reports she has been "nervous" all morning and was feeling some "palpitations." Pt states she just took lopressor and clonidine  prior to therapy session (~9min before driving to therapy). Pt  reports she usually takes her medications around 10AM, but didn't take them until closer to 2:00pm today because she was on the phone with her sorority sister discussing a concerning matter. Pt states she has a hx of tachycardia.  States she is having a little bit of a headache today, but "not bad."  Pt states she is still waiting for PCP office to refill her benicar  prescription for her BP (this is the medication pt reports she ran out of 2 PT sessions ago). Pt states she has apt with PCP on June 16th.   Pt states she did drink some water today so far(16.9oz), but states she plans to drink 3 more bottles to total  64oz today. States overall she has improved her water intake since last session.  States she hasn't been to the gym yet. States she needs to reach out to her PCP for a referral to see cardiologist because she doesn't know any providers in this area due to having moved here last year in June.  Pt states she has felt more "off balance" lately, but not having any "vertigo."   From prior sessions: - pt on meclizine  for dizziness PRN - experiences "throat burning" with her dizziness - can have delayed dizziness response - when lying in bed at night if she sees the ceiling fan spinning around then it really causes her to be dizzy.  - Pt is not currently on medications for migraines, was previously on Topamax   - moved to [facility] last June 2024.    Pt accompanied by: self  PERTINENT HISTORY:  From Eval:  The pt is a pleasant 68 y/o female presenting to PT for vertigo/dizziness and imbalance. Pt reports onset of dizziness started May last year. She was driving and dizziness began out of nowhere. It lasted for 3 minutes at the time. She reports frequency of dizzy episodes has increased a bit more now since last year. She reports a headache and burning in her throat sensation that typically precedes vertigo. She always gets a headache before or during dizzy episodes.  Pt with hx of migraines. She takes ibuprofen or Tylenol to treat her symptoms. She used to have migraines frequently. She says now headaches seem more sinus-related. Headaches now are more mild and are felt in her sinuses or top of head. She does have ringing in her ears sometimes, sometimes occurs before headache, particularly in R ear, does have pain in L ear.  She reports she has been diagnosed with bilateral hearing loss. She reports her balance is not good and states it is like her equilibrium is off, affects ability to walk up steps, walk around corners. She reports no falls in last 6 months.  She says her legs can hurt at night;  this occurred when she took a particular statin. She reports her sleep is not good, she has sleep apnea but has not been using CPAP until yesterday.  PT just moved back to the area and does not have a neurologist here currently. Other information: Pt can have dizziness watching TV, no dizziness looking at her phone, no dizziness in busy stores, no issues with symptoms as passenger in a car. She gets seasick, used to like go fishing, but had to stop.  Pt likes to dance and cook, and she likes to travel.  Treadmills make her dizzy. She goes to the gym, does everything except for treadmill/bikes.   PMH per chart significant for acute kidney failure, atherosclerotic heart disease of native coronary  artery without angina pectoris, chronic kidney disease, congestive heart failure, DM, endometrial intraepithelial neoplasia, enlarged heart, folate deficiency anemia, HTN, OSA, osteonecrosis, osteoporosis, vitamin B12 deficiency, Vitamin D deficiency, migraines please refer to chart for full PMH  PAIN:  Are you having pain? Headache at baseline  PRECAUTIONS: None and Fall  RED FLAGS: None   WEIGHT BEARING RESTRICTIONS: No  FALLS: Has patient fallen in last 6 months? No  LIVING ENVIRONMENT: Lives with: lives with their spouse Stairs: 2 to get into her home no handrails, 12 inside has bilateral handrails Has following equipment at home:   PLOF: Independent  PATIENT GOALS: Get rid of the vertigo   OBJECTIVE:  Note: Objective measures were completed at Evaluation unless otherwise noted.  DIAGNOSTIC FINDINGS:  Via chart CT HEAD 03/14/23: " FINDINGS: Brain: No evidence of acute infarction, hemorrhage, hydrocephalus, extra-axial collection or mass lesion/mass effect. Similar patchy white matter hypodensities are nonspecific but compatible with chronic microvascular ischemic disease.   Vascular: No hyperdense vessel.   Skull: No acute fracture.   Sinuses/Orbits: Clear sinuses.  No acute  orbital findings.   Other: No mastoid effusions.   IMPRESSION: No evidence of acute intracranial abnormality.     Electronically Signed   By: Stevenson Elbe M.D.   On: 03/14/2023 12:18"  COGNITION: Overall cognitive status: Within functional limits for tasks assessed   SENSATION: Pt reports occ numbness/tingling in her legs -was present when taking statin   EDEMA:  Reports no swelling    POSTURE:  Rounded shoulders, slight increase kyphosis/fwd head  Cervical ROM:   AROM screen - formal assessment deferred, assessing to determine if pt has sufficient range of motion for vestibular tests, not for normative values  Rotation bilat WFL for testing - no pain Flex - WFL for testing and pain free Ext - WFL for testing - notices headache (has at baseline) with ext more than flex   STRENGTH: deferred    BED MOBILITY:  Bed mobility can make pt dizzy (rolling, supine>sit)  TRANSFERS: Assistive device utilized: None  Sit to stand: Complete Independence Stand to sit: Complete Independence Chair to chair: Complete Independence   GAIT: Gait pattern: increased variability in BOS with head turns/scanning, scanning impairs gait mechanics with pt becoming slightly unsteady Distance walked: clinic distances Assistive device utilized: None Level of assistance: SBA and CGA   FUNCTIONAL TESTS:  Dynamic Gait Index: 21/24  PATIENT SURVEYS:  DHI deferred to future visit  VESTIBULAR ASSESSMENT:   OCULOMOTOR EXAM:  Ocular Alignment: normal  Ocular ROM: No Limitations  Spontaneous Nystagmus: absent  Gaze-Induced Nystagmus: absent  Smooth Pursuits: intact - does make pt feel dizzy, described as lightheaded   Saccades: catch-up saccade (undershooting) with L gaze, corrective saccade (undershooting) looking up - some symptoms but dizziness not as bas as with pursuits   Convergence/Divergence:  sees double 2" from face, converges, some dizziness (described as light-headed)    VESTIBULAR - OCULAR REFLEX:   Slow VOR: Normal - makes pt a little dizzy  VOR Cancellation: Normal - makes pt a little dizzy   Head-Impulse Test: Positive to L with corrective saccade - makes pt dizzy but also movement helps headache; positive to R with undershooting and corrective saccade    POSITIONAL TESTING: deferred to future visit  TREATMENT DATE: 10/01/23  Assessed vitals due to pt reporting feeling "palpitations" today. Seated vitals at beginning of session: BP 181/103 (MAP 123), HR 109-111bpm, SpO2 98%    Reassessed vitals in sitting: BP 157/95 (MAP 110), HR 104bpm, Spo2 97%   Educated pt to purchase a BP cuff today and monitor her BP over the next several hours and if it does not continue to decrease, go to ED. Also, educated on option to also purchase pulse oximeter to monitor her HR if needed. Educated pt on calling PCP to discuss moving her follow-up appointment to sooner if possible. Educated pt to send MyChart message to PCP to receive cardiology referral.   Reinforced education to pt on continuing to ensure adequate water intake. Also educated on importance of taking prescription medications at the same time each day, as prescribed. .  Based on elevated BP and HR today with pt reporting feeling "palpitations," deferred continuing therapy appointment at this time. Patient's HR is usually in the 70s during therapy session.  Pt called PCP and notified PCP of her elevated BP and HR.  Pt states her PCP has put in an order for BP medication and her PCP will put in a referral to cardiologist.  Discontinued therapy appointment at this time.   PATIENT EDUCATION:  Education details: exercise technique Person educated: Patient Education method: Explanation, Demonstration, and Verbal cues Education comprehension: verbalized understanding, returned  demonstration, and needs further education  HOME EXERCISE PROGRAM:   Access Code: [email or web address] URL: [email or web address] Date: 08/29/2023 Prepared by: Aminta Kales  Exercises - Seated Scapular Retraction  - 1 x daily - 7 x weekly - 2 sets - 10 reps - Seated Shoulder Shrugs  - 1 x daily - 7 x weekly - 2 sets - 10 reps  Access Code: [email or web address] URL: [email or web address] Date: 08/27/2023 Prepared by: Aminta Kales  Exercises - Seated Gaze Stabilization with Head Rotation  - 1 x daily - 7 x weekly - 2 sets - 1 reps - 30 seconds hold   GOALS: Goals reviewed with patient? Yes, initiated, to continue with further assessment  SHORT TERM GOALS: Target date: 11/12/2023   Patient will be independent in home exercise program to improve balance, dizziness and mobility for increased QOL and ease with ADLs. Baseline: initiated  Goal status: INITIAL   LONG TERM GOALS: Target date: 12/24/2023    Patient will reduce dizziness handicap inventory score to <50, for less dizziness with ADLs and increased safety with home and work tasks. .  Baseline: 09/09/2023: 28 Goal status: INITIAL  2.  Patient will report at least a 30% reduction in dizzy symptoms with provoking movements since start of PT to indicate increased ease with ADLs and mobility. Baseline: horizontal, vertical head movements, bed mobility and other movements currently make pt dizzy Goal status: INITIAL  3.  Patient will increase dynamic gait index score to at least 23/24 as to demonstrate reduced fall risk and improved dynamic gait balance for better safety with community/home ambulation.   Baseline: 21/24 most difficulty with head turn activities Goal status: INITIAL   ASSESSMENT:  CLINICAL IMPRESSION:  Patient arrives reporting feeling "palpitations" today. Therapist assessed vitals with pt noted to have elevated HR and BP. Therapist educated pt on importance of her purchasing a BP machine today and  continuing to monitor vitals over the next several hours to ensure BP and HR are improving and educated pt to go to ED if symptoms worsen or if vitals remain elevated. Pt called PCP prior to  leaving therapy session to notify PCP of her symptoms and pt reports PCP put in a medication prescription for pt to pick-up. Pt reports PCP is also putting pt in a referral to cardiology. Therapist educated pt on importance of ensuring she takes her prescription medications as prescribed and at the same time daily. Plan to continue with cardiovascular training and gaze stabilization exercises in future sessions (when appropriate) to manage dizziness symptoms (remembering pt often experiences delayed symptoms). Ms. Sawka will benefit from further skilled PT to improve dizziness, gait, balance and mobility in order to increase ease and safety with ADLs and QOL.  OBJECTIVE IMPAIRMENTS: Abnormal gait, decreased balance, difficulty walking, dizziness, impaired sensation, improper body mechanics, postural dysfunction, and pain.   ACTIVITY LIMITATIONS: bending, stairs, bed mobility, locomotion level, and activities in general can be impacted/impaired if pt having dizzy episode  PARTICIPATION LIMITATIONS: meal prep, cleaning, driving, shopping, community activity, yard work, and ADLs generally impaired it pt having dizzy episode  PERSONAL FACTORS: Age, Sex, Time since onset of injury/illness/exacerbation, and 3+ comorbidities: PMH per chart significant for acute kidney failure, atherosclerotic heart disease of native coronary artery without angina pectoris, chronic kidney disease, congestive heart failure, DM, endometrial intraepithelial neoplasia, enlarged heart, folate deficiency anemia, HTN, OSA, osteonecrosis, osteoporosis, vitamin B12 deficiency, Vitamin D deficiency, migraines please refer to chart for full PMH are also affecting patient's functional outcome.   REHAB POTENTIAL: Good  CLINICAL DECISION MAKING:  Evolving/moderate complexity  EVALUATION COMPLEXITY: Moderate   PLAN:  PT FREQUENCY: 1-2x/week  PT DURATION: 12 weeks  PLANNED INTERVENTIONS: 97164- PT Re-evaluation, 97750- Physical Performance Testing, 97110-Therapeutic exercises, 97530- Therapeutic activity, [id number]- Neuromuscular re-education, 97535- Self Care, 16109- Manual therapy, [id number]- Gait training, [phone]- Orthotic Initial, [phone]- Orthotic/Prosthetic subsequent, [phone]- Canalith repositioning, [id number]- Electrical stimulation (unattended), [phone]- Electrical stimulation (manual), Patient/Family education, Balance training, Stair training, Dry Needling, Joint mobilization, Spinal mobilization, Vestibular training, DME instructions, Cryotherapy, and Moist heat  PLAN FOR NEXT SESSION:   *follow-up on elevated BP and HR during last session*  - follow-up on recommendations for: - increased daily water intake - set-up with cardiologist for BP management and follow-up pt reports of "calcifications" in arteries of her neck - recheck orthostatic hypotension if symptomatic  - SBP dropped by 13 on 09/26/2023 - continue cardio conditioning followed by gaze stabilization  - reinforce education on working at 5/10 on Borg RPE scale - advance HEP as indicated - balance interventions     Vonita Calloway, PT, DPT, NCS, CSRS Physical Therapist - [place]  [facility]  3:19 PM 10/01/23

## 2023-10-02 ENCOUNTER — Telehealth: Payer: Self-pay

## 2023-10-02 NOTE — Telephone Encounter (Signed)
 I called and left pt vm to call the office. We need to schedule her an appointment to be evaluated for her bp. [place] [employer]

## 2023-10-03 ENCOUNTER — Ambulatory Visit: Admitting: Physical Therapy

## 2023-10-03 DIAGNOSIS — R42 Dizziness and giddiness: Secondary | ICD-10-CM

## 2023-10-03 DIAGNOSIS — M542 Cervicalgia: Secondary | ICD-10-CM

## 2023-10-03 DIAGNOSIS — R2681 Unsteadiness on feet: Secondary | ICD-10-CM

## 2023-10-03 NOTE — Therapy (Signed)
 OUTPATIENT PHYSICAL THERAPY VESTIBULAR TREATMENT     Patient Name: Monique Graham MRN: [id number] DOB:1956/01/09, 68 y.o., female Today's Date: 10/03/2023  END OF SESSION:   PT End of Session - 10/03/23 0850     Visit Number 8    Number of Visits 25    Date for PT Re-Evaluation 11/19/23    PT Start Time 0849    PT Stop Time 0929    PT Time Calculation (min) 40 min    Equipment Utilized During Treatment Gait belt    Activity Tolerance Patient tolerated treatment well    Behavior During Therapy WFL for tasks assessed/performed                Past Medical History:  Diagnosis Date   Acute kidney failure (HCC)    Atherosclerotic heart disease of native coronary artery without angina pectoris    Chronic kidney disease (CKD)    Congestive heart failure (CHF) (HCC)    Coronary atherosclerosis due to calcified coronary lesion    Cyst of left kidney    Diabetes mellitus without complication (HCC)    Diverticulosis of large intestine without perforation or abscess without bleeding    Endometrial intraepithelial neoplasia (EIN)    Enlarged heart    Folate deficiency anemia    Gastric ulcer    Hyperlipidemia    Hypertension    Migraine    Nonerosive esophageal reflux disease    Nonrheumatic mitral (valve) insufficiency    Obstructive sleep apnea    Osteonecrosis (HCC)    Osteoporosis    Other specified disorders of bone density and structure, multiple sites    Ovarian cyst    Polycystic ovary syndrome    Premature ventricular contractions    Restless leg syndrome    Vitamin B12 deficiency    Vitamin D deficiency    Past Surgical History:  Procedure Laterality Date   ABDOMINAL HYSTERECTOMY     OOPHORECTOMY     Patient Active Problem List   Diagnosis Date Noted   Hair loss 06/20/2023   Sleep apnea 06/20/2023   Otalgia, left 04/10/2023   Vertigo 04/10/2023   Class 2 obesity due to excess calories with body mass index (BMI) of 38.0 to 38.9 in adult  04/10/2023   Non-seasonal allergic rhinitis 04/10/2023   Benign hypertensive heart and CKD, 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), w CHF (HCC) 03/11/2023   Class 2 severe obesity due to excess calories with serious comorbidity and body mass index (BMI) of 39.0 to 39.9 in adult (HCC) 03/11/2023   Dyslipidemia due to type 2 diabetes mellitus (HCC) 03/11/2023   Type 2 diabetes mellitus with stage 3a chronic kidney disease, without long-term current use of insulin (HCC) 03/11/2023   Vitamin B12 deficiency 01/30/2023   Cystitis 01/30/2023   Acute right flank pain 01/30/2023   Establishing care with new doctor, encounter for 01/30/2023   Hyperlipidemia LDL goal <100 01/30/2023   Intractable chronic migraine without aura and without status migrainosus 01/30/2023   Hypertension 01/30/2023   Immunization due 01/30/2023    PCP: Melodie Spry, NP REFERRING PROVIDER: Lorane Rocker, PA-C  REFERRING DIAG: vertigo   THERAPY DIAG:  Cervicalgia  Dizziness and giddiness  Unsteadiness on feet  ONSET DATE: May 2024  Rationale for Evaluation and Treatment: Rehabilitation  SUBJECTIVE:   SUBJECTIVE STATEMENT:   Pt reports she was able to take her BP medication this morning. States she spoke with PCP office and is going to see them tomorrow regarding her BP. Pt states  she mostly relaxed and took it easy after last therapy session. Pt denies feeling palpitations today.   Pt states she had a headache earlier this morning, but doesn't currently have one. Pt states she hasn't had vertigo this week.   Pt states she didn't purchase a BP machine yet, but plans to coordinate with MD to get a cuff that will send the readings to her provider.  Pt reports she is continuing to try and drink more water.    From prior sessions: - pt on meclizine  for dizziness PRN - experiences "throat burning" with her dizziness - can have delayed dizziness response - when lying in bed at night if she sees the ceiling fan spinning around  then it really causes her to be dizzy.  - Pt is not currently on medications for migraines, was previously on Topamax   - moved to [facility] last June 2024.    Pt accompanied by: self  PERTINENT HISTORY:  From Eval:  The pt is a pleasant 68 y/o female presenting to PT for vertigo/dizziness and imbalance. Pt reports onset of dizziness started May last year. She was driving and dizziness began out of nowhere. It lasted for 3 minutes at the time. She reports frequency of dizzy episodes has increased a bit more now since last year. She reports a headache and burning in her throat sensation that typically precedes vertigo. She always gets a headache before or during dizzy episodes.  Pt with hx of migraines. She takes ibuprofen or Tylenol to treat her symptoms. She used to have migraines frequently. She says now headaches seem more sinus-related. Headaches now are more mild and are felt in her sinuses or top of head. She does have ringing in her ears sometimes, sometimes occurs before headache, particularly in R ear, does have pain in L ear.  She reports she has been diagnosed with bilateral hearing loss. She reports her balance is not good and states it is like her equilibrium is off, affects ability to walk up steps, walk around corners. She reports no falls in last 6 months.  She says her legs can hurt at night; this occurred when she took a particular statin. She reports her sleep is not good, she has sleep apnea but has not been using CPAP until yesterday.  PT just moved back to the area and does not have a neurologist here currently. Other information: Pt can have dizziness watching TV, no dizziness looking at her phone, no dizziness in busy stores, no issues with symptoms as passenger in a car. She gets seasick, used to like go fishing, but had to stop.  Pt likes to dance and cook, and she likes to travel.  Treadmills make her dizzy. She goes to the gym, does everything except for treadmill/bikes.    PMH per chart significant for acute kidney failure, atherosclerotic heart disease of native coronary artery without angina pectoris, chronic kidney disease, congestive heart failure, DM, endometrial intraepithelial neoplasia, enlarged heart, folate deficiency anemia, HTN, OSA, osteonecrosis, osteoporosis, vitamin B12 deficiency, Vitamin D deficiency, migraines please refer to chart for full PMH  PAIN:  Are you having pain? Headache at baseline  PRECAUTIONS: None and Fall  RED FLAGS: None   WEIGHT BEARING RESTRICTIONS: No  FALLS: Has patient fallen in last 6 months? No  LIVING ENVIRONMENT: Lives with: lives with their spouse Stairs: 2 to get into her home no handrails, 12 inside has bilateral handrails Has following equipment at home:   PLOF: Independent  PATIENT GOALS:  Get rid of the vertigo   OBJECTIVE:  Note: Objective measures were completed at Evaluation unless otherwise noted.  DIAGNOSTIC FINDINGS:  Via chart CT HEAD 03/14/23: " FINDINGS: Brain: No evidence of acute infarction, hemorrhage, hydrocephalus, extra-axial collection or mass lesion/mass effect. Similar patchy white matter hypodensities are nonspecific but compatible with chronic microvascular ischemic disease.   Vascular: No hyperdense vessel.   Skull: No acute fracture.   Sinuses/Orbits: Clear sinuses.  No acute orbital findings.   Other: No mastoid effusions.   IMPRESSION: No evidence of acute intracranial abnormality.     Electronically Signed   By: Stevenson Elbe M.D.   On: 03/14/2023 12:18"  COGNITION: Overall cognitive status: Within functional limits for tasks assessed   SENSATION: Pt reports occ numbness/tingling in her legs -was present when taking statin   EDEMA:  Reports no swelling    POSTURE:  Rounded shoulders, slight increase kyphosis/fwd head  Cervical ROM:   AROM screen - formal assessment deferred, assessing to determine if pt has sufficient range of motion for  vestibular tests, not for normative values  Rotation bilat WFL for testing - no pain Flex - WFL for testing and pain free Ext - WFL for testing - notices headache (has at baseline) with ext more than flex   STRENGTH: deferred    BED MOBILITY:  Bed mobility can make pt dizzy (rolling, supine>sit)  TRANSFERS: Assistive device utilized: None  Sit to stand: Complete Independence Stand to sit: Complete Independence Chair to chair: Complete Independence   GAIT: Gait pattern: increased variability in BOS with head turns/scanning, scanning impairs gait mechanics with pt becoming slightly unsteady Distance walked: clinic distances Assistive device utilized: None Level of assistance: SBA and CGA   FUNCTIONAL TESTS:  Dynamic Gait Index: 21/24  PATIENT SURVEYS:  DHI deferred to future visit  VESTIBULAR ASSESSMENT:   OCULOMOTOR EXAM:  Ocular Alignment: normal  Ocular ROM: No Limitations  Spontaneous Nystagmus: absent  Gaze-Induced Nystagmus: absent  Smooth Pursuits: intact - does make pt feel dizzy, described as lightheaded   Saccades: catch-up saccade (undershooting) with L gaze, corrective saccade (undershooting) looking up - some symptoms but dizziness not as bas as with pursuits   Convergence/Divergence:  sees double 2" from face, converges, some dizziness (described as light-headed)   VESTIBULAR - OCULAR REFLEX:   Slow VOR: Normal - makes pt a little dizzy  VOR Cancellation: Normal - makes pt a little dizzy   Head-Impulse Test: Positive to L with corrective saccade - makes pt dizzy but also movement helps headache; positive to R with undershooting and corrective saccade    POSITIONAL TESTING: deferred to future visit                                                                                                                           TREATMENT DATE: 10/03/23  Vitals in sitting to start session: BP 147/87 (MAP 105), HR 74bpm, SpO2 100%  Reinforced education to pt on  importance  of taking her medications as prescribed and at the same time daily.   B LE and B UE reciprocal movement pattern on Nustep for cardiovascular training against level 2 resistance for 2 minutes, level 3 for 4 minutes, totaling 6 minutes with pt maintaining SPM (steps per minute) > 75 for at least 80% of the time, totaling 466 steps.  Pt reports feeling her leg muscles working during this exercise Pt rates Borg RPE scale of 5/10  Re-assessed vitals after Nustep: BP 143/69 (MAP 87), HR 82bpm decreased to 76bpm with rest, SpO2 100%  Reinforced education to pt on continuing to ensure adequate water intake daily.  Standing Gaze Stabilization: Horizontal VOR 2x 30 sec Vertical VOR 2x 30 sec  Pt reports this as the most challenging of the gaze stabilization exercises, with pt having feelings of "wanting to throw up" but states "I'm not" Horizontal VOR cancellation 2x 30 sec Pt reports feeling symptomatic with this  *overall pt with difficulty coordinating both eyes and head movements on all gaze stabilization exercises   Gait training ~153ft, no AD, with CGA for safety while performing R/L head turns on verbal command by therapist - pt reports feeling off balance, but no noticeable postural sway, but pt does limit amount of head rotation performed.   PATIENT EDUCATION:  Education details: exercise technique Person educated: Patient Education method: Explanation, Demonstration, and Verbal cues Education comprehension: verbalized understanding, returned demonstration, and needs further education  HOME EXERCISE PROGRAM:  Access Code: [email or web address] URL: [email or web address] Date: 10/03/2023 Prepared by: Carlen Chasten  Exercises - Walking  - 1 x daily - 3 x weekly - 1 sets - 3-6 minutes hold - Recumbent Bike  - 1 x daily - 3 x weekly - 1 sets - 6 minutes hold - Seated Scapular Retraction  - 1 x daily - 7 x weekly - 2 sets - 10 reps - Seated Shoulder Shrugs  - 1 x daily -  7 x weekly - 2 sets - 10 reps - Seated Gaze Stabilization with Head Rotation  - 1 x daily - 7 x weekly - 2 sets - 30 seconds hold - Seated Gaze Stabilization with Head Nod  - 1 x daily - 7 x weekly - 2 sets - 30 seconds hold    GOALS: Goals reviewed with patient? Yes, initiated, to continue with further assessment  SHORT TERM GOALS: Target date: 11/14/2023   Patient will be independent in home exercise program to improve balance, dizziness and mobility for increased QOL and ease with ADLs. Baseline: initiated  Goal status: INITIAL   LONG TERM GOALS: Target date: 12/26/2023    Patient will reduce dizziness handicap inventory score to <50, for less dizziness with ADLs and increased safety with home and work tasks. .  Baseline: 09/09/2023: 28 Goal status: INITIAL  2.  Patient will report at least a 30% reduction in dizzy symptoms with provoking movements since start of PT to indicate increased ease with ADLs and mobility. Baseline: horizontal, vertical head movements, bed mobility and other movements currently make pt dizzy Goal status: INITIAL  3.  Patient will increase dynamic gait index score to at least 23/24 as to demonstrate reduced fall risk and improved dynamic gait balance for better safety with community/home ambulation.   Baseline: 21/24 most difficulty with head turn activities Goal status: INITIAL   ASSESSMENT:  CLINICAL IMPRESSION:  Patient arrives reporting no symptoms today and vitals WNL allowing increased participation in therapy session. Therapist educated pt on importance of ensuring she  takes her prescription medications as prescribed and at the same time daily. Patient able to participate in cardiovascular training and gaze stabilization exercises this sessions with goal of managing dizziness symptoms (remembering pt often experiences delayed symptoms). Patient does report feeling B LE muscle fatigue on Nustep. During gaze stabilization exercises pt reports feeling  most symptomatic during vertical VOR and pt noted to have difficulty coordinating head and eye movements with all of these exercises. Patient able to participate in gait with horizontal head turns, not trying to look at any specific target, with pt reporting feeling off balance and limiting head movements although no significant postural sway noted. Ms. Jeanlouis will benefit from further skilled PT to improve dizziness, gait, balance and mobility in order to increase ease and safety with ADLs and QOL.  OBJECTIVE IMPAIRMENTS: Abnormal gait, decreased balance, difficulty walking, dizziness, impaired sensation, improper body mechanics, postural dysfunction, and pain.   ACTIVITY LIMITATIONS: bending, stairs, bed mobility, locomotion level, and activities in general can be impacted/impaired if pt having dizzy episode  PARTICIPATION LIMITATIONS: meal prep, cleaning, driving, shopping, community activity, yard work, and ADLs generally impaired it pt having dizzy episode  PERSONAL FACTORS: Age, Sex, Time since onset of injury/illness/exacerbation, and 3+ comorbidities: PMH per chart significant for acute kidney failure, atherosclerotic heart disease of native coronary artery without angina pectoris, chronic kidney disease, congestive heart failure, DM, endometrial intraepithelial neoplasia, enlarged heart, folate deficiency anemia, HTN, OSA, osteonecrosis, osteoporosis, vitamin B12 deficiency, Vitamin D deficiency, migraines please refer to chart for full PMH are also affecting patient's functional outcome.   REHAB POTENTIAL: Good  CLINICAL DECISION MAKING: Evolving/moderate complexity  EVALUATION COMPLEXITY: Moderate   PLAN:  PT FREQUENCY: 1-2x/week  PT DURATION: 12 weeks  PLANNED INTERVENTIONS: 97164- PT Re-evaluation, 97750- Physical Performance Testing, 97110-Therapeutic exercises, 97530- Therapeutic activity, [id number]- Neuromuscular re-education, 97535- Self Care, 91478- Manual therapy, [id number]- Gait  training, [phone]- Orthotic Initial, [phone]- Orthotic/Prosthetic subsequent, [phone]- Canalith repositioning, [id number]- Electrical stimulation (unattended), [phone]- Electrical stimulation (manual), Patient/Family education, Balance training, Stair training, Dry Needling, Joint mobilization, Spinal mobilization, Vestibular training, DME instructions, Cryotherapy, and Moist heat  PLAN FOR NEXT SESSION:   *follow-up on elevated BP and HR from 5/20 apt - pt seeing PCP on 10/04/23*  - follow-up on recommendations for: - increased daily water intake - set-up with cardiologist for BP management and follow-up pt reports of "calcifications" in arteries of her neck - recheck orthostatic hypotension if symptomatic  - SBP dropped by 13 on 09/26/2023 - continue cardio conditioning followed by gaze stabilization  - reinforce education on working at 5/10 on Borg RPE scale - advance HEP as indicated - balance interventions     Alezandra Egli, PT, DPT, NCS, CSRS Physical Therapist - [facility]  [facility]  9:32 AM 10/03/23

## 2023-10-04 ENCOUNTER — Ambulatory Visit (INDEPENDENT_AMBULATORY_CARE_PROVIDER_SITE_OTHER): Admitting: Family Medicine

## 2023-10-04 ENCOUNTER — Encounter: Payer: Self-pay | Admitting: Family Medicine

## 2023-10-04 VITALS — BP 160/90 | HR 63 | Temp 98.5°F | Ht 62.0 in | Wt 232.0 lb

## 2023-10-04 DIAGNOSIS — E785 Hyperlipidemia, unspecified: Secondary | ICD-10-CM

## 2023-10-04 DIAGNOSIS — E1122 Type 2 diabetes mellitus with diabetic chronic kidney disease: Secondary | ICD-10-CM | POA: Diagnosis not present

## 2023-10-04 DIAGNOSIS — E1169 Type 2 diabetes mellitus with other specified complication: Secondary | ICD-10-CM | POA: Diagnosis not present

## 2023-10-04 DIAGNOSIS — R002 Palpitations: Secondary | ICD-10-CM

## 2023-10-04 DIAGNOSIS — N1831 Chronic kidney disease, stage 3a: Secondary | ICD-10-CM

## 2023-10-04 DIAGNOSIS — I129 Hypertensive chronic kidney disease with stage 1 through stage 4 chronic kidney disease, or unspecified chronic kidney disease: Secondary | ICD-10-CM | POA: Insufficient documentation

## 2023-10-04 DIAGNOSIS — Z6841 Body Mass Index (BMI) 40.0 and over, adult: Secondary | ICD-10-CM | POA: Insufficient documentation

## 2023-10-04 DIAGNOSIS — E66813 Obesity, class 3: Secondary | ICD-10-CM

## 2023-10-04 MED ORDER — METOPROLOL TARTRATE 50 MG PO TABS: 50.0000 mg | ORAL_TABLET | Freq: Two times a day (BID) | ORAL | 1 refills | Status: DC

## 2023-10-04 MED ORDER — REPATHA SURECLICK 140 MG/ML ~~LOC~~ SOAJ: 140.0000 mg | SUBCUTANEOUS | 1 refills | Status: DC

## 2023-10-04 MED ORDER — CLONIDINE HCL 0.1 MG PO TABS: 0.1000 mg | ORAL_TABLET | Freq: Two times a day (BID) | ORAL | 1 refills | Status: DC

## 2023-10-04 MED ORDER — OLMESARTAN MEDOXOMIL 40 MG PO TABS: 40.0000 mg | ORAL_TABLET | Freq: Every day | ORAL | 2 refills | Status: DC

## 2023-10-04 NOTE — Assessment & Plan Note (Signed)
Encouraged to keep BP well controlled and avoid use of NSAIDs

## 2023-10-04 NOTE — Progress Notes (Signed)
 I,[name], CMA,acting as a Neurosurgeon for [name], NP.,have documented all relevant documentation on the behalf of Melodie Spry, NP,as directed by  Melodie Spry, NP while in the presence of Melodie Spry, NP.  Subjective:  Patient ID: Monique Graham , female    DOB: Jun 19, 1955 , 68 y.o.   MRN: [id number]  Chief Complaint  Patient presents with   Hypertension    Patient presents today for BP check. Patient doesn't have any specific questions or concerns. Patient in therapy for vertigo and when she went her BP was extremely high. Denies headaches, chest pain and sob.    HPI  Patient is a 68 year old female who presents today for chronic disease management. Patient states that she was in therapy during the week and her BP was extremely high. She states that she had not taken her Benicar  40 mg every day for up to a week and she has not been too compliant with taking her clonidine  0.1 mg twice daily either. She states that she has a diagnosis of intermittent palpitations and is on metoprolol daily for it but recently it has been happening. She wants a cardiologist because she used to see one in the past.     Past Medical History:  Diagnosis Date   Acute kidney failure (HCC)    Atherosclerotic heart disease of native coronary artery without angina pectoris    Chronic kidney disease (CKD)    Congestive heart failure (CHF) (HCC)    Coronary atherosclerosis due to calcified coronary lesion    Cyst of left kidney    Diabetes mellitus without complication (HCC)    Diverticulosis of large intestine without perforation or abscess without bleeding    Endometrial intraepithelial neoplasia (EIN)    Enlarged heart    Folate deficiency anemia    Gastric ulcer    Hyperlipidemia    Hypertension    Migraine    Nonerosive esophageal reflux disease    Nonrheumatic mitral (valve) insufficiency    Obstructive sleep apnea    Osteonecrosis (HCC)    Osteoporosis    Other specified  disorders of bone density and structure, multiple sites    Ovarian cyst    Polycystic ovary syndrome    Premature ventricular contractions    Restless leg syndrome    Vitamin B12 deficiency    Vitamin D deficiency      Family History  Problem Relation Age of Onset   Hypertension Mother    Diabetes Mother    Heart disease Mother    Breast cancer Mother    Diabetes Father    Breast cancer Sister    Breast cancer Cousin    ALS Cousin      Current Outpatient Medications:    allopurinol (ZYLOPRIM) 100 MG tablet, Take 100 mg by mouth daily., Disp: , Rfl:    aspirin EC 81 MG tablet, Take 81 mg by mouth daily., Disp: , Rfl:    azelastine  (ASTELIN ) 0.1 % nasal spray, Place 2 sprays into both nostrils 2 (two) times daily. Use in each nostril as directed, Disp: 30 mL, Rfl: 12   desloratadine  (CLARINEX ) 5 MG tablet, TAKE 1 TABLET (5 MG TOTAL) BY MOUTH DAILY., Disp: 90 tablet, Rfl: 3   diclofenac  Sodium (VOLTAREN ) 1 % GEL, Apply 2 g topically daily as needed., Disp: 100 g, Rfl: 1   fluticasone  (FLONASE ) 50 MCG/ACT nasal spray, Place 2 sprays into both nostrils daily., Disp: 16 g, Rfl: 6   Folic Acid (FOLATE PO),  Take 666 mcg by mouth daily at 12 noon., Disp: , Rfl:    meclizine  (ANTIVERT ) 25 MG tablet, Take 1 tablet (25 mg total) by mouth 3 (three) times daily as needed for dizziness., Disp: 30 tablet, Rfl: 1   OZEMPIC , 1 MG/DOSE, 4 MG/3ML SOPN, INJECT 1 MG INTO THE SKIN ONE TIME PER WEEK, Disp: 3 mL, Rfl: 2   cloNIDine  (CATAPRES ) 0.1 MG tablet, Take 1 tablet (0.1 mg total) by mouth 2 (two) times daily., Disp: 180 tablet, Rfl: 1   Evolocumab  (REPATHA  SURECLICK) 140 MG/ML SOAJ, Inject 140 mg into the skin every 14 (fourteen) days., Disp: 6 mL, Rfl: 1   ezetimibe  (ZETIA ) 10 MG tablet, Take 1 tablet (10 mg total) by mouth daily. (Patient not taking: Reported on 10/04/2023), Disp: 30 tablet, Rfl: 11   metoprolol tartrate (LOPRESSOR) 50 MG tablet, Take 1 tablet (50 mg total) by mouth 2 (two) times  daily., Disp: 180 tablet, Rfl: 1   olmesartan  (BENICAR ) 40 MG tablet, Take 1 tablet (40 mg total) by mouth daily., Disp: 90 tablet, Rfl: 2   Rimegepant Sulfate (NURTEC) 75 MG TBDP, Take 1 tablet (75 mg total) by mouth every other day., Disp: 16 tablet, Rfl: 2   Allergies  Allergen Reactions   Oxycodone-Acetaminophen Anaphylaxis and Other (See Comments)   Latex Rash   Lactose Diarrhea   Sulfa Antibiotics Other (See Comments)     Review of Systems  Constitutional: Negative.   HENT: Negative.    Respiratory: Negative.    Cardiovascular:  Positive for palpitations.  Genitourinary: Negative.   Musculoskeletal: Negative.   Neurological:  Negative for dizziness, light-headedness, numbness and headaches.  Psychiatric/Behavioral: Negative.       Today's Vitals   10/04/23 1033  BP: (!) 160/90  Pulse: 63  Temp: 98.5 F (36.9 C)  SpO2: 98%  Weight: 232 lb (105.2 kg)  Height: 5\' 2"  (1.575 m)   Body mass index is 42.43 kg/m.  Wt Readings from Last 3 Encounters:  10/04/23 232 lb (105.2 kg)  08/26/23 227 lb 9.6 oz (103.2 kg)  07/05/23 217 lb (98.4 kg)    The 10-year ASCVD risk score (Arnett DK, et al., 2019) is: 43.1%   Values used to calculate the score:     Age: 33 years     Sex: Female     Is Non-Hispanic African American: Yes     Diabetic: Yes     Tobacco smoker: No     Systolic Blood Pressure: 160 mmHg     Is BP treated: Yes     HDL Cholesterol: 48 mg/dL     Total Cholesterol: 251 mg/dL  Objective:  Physical Exam HENT:     Head: Normocephalic.  Cardiovascular:     Rate and Rhythm: Normal rate.  Pulmonary:     Effort: Pulmonary effort is normal.     Breath sounds: Normal breath sounds.  Skin:    General: Skin is warm and dry.  Neurological:     Mental Status: She is alert and oriented to person, place, and time.         Assessment And Plan:  Dyslipidemia due to type 2 diabetes mellitus (HCC) -     CBC -     CMP14+EGFR -     Lipid panel -     Repatha   SureClick; Inject 140 mg into the skin every 14 (fourteen) days.  Dispense: 6 mL; Refill: 1  Class 3 severe obesity due to excess calories with serious comorbidity and  body mass index (BMI) of 40.0 to 44.9 in adult -     Amb Ref to Medical Weight Management  Type 2 diabetes mellitus with stage 3a chronic kidney disease, without long-term current use of insulin (HCC) -     Hemoglobin A1c -     Ambulatory referral to Ophthalmology  Benign hypertension with CKD (chronic kidney disease) stage III (HCC) -     cloNIDine  HCl; Take 1 tablet (0.1 mg total) by mouth 2 (two) times daily.  Dispense: 180 tablet; Refill: 1 -     Metoprolol Tartrate; Take 1 tablet (50 mg total) by mouth 2 (two) times daily.  Dispense: 180 tablet; Refill: 1 -     Olmesartan  Medoxomil; Take 1 tablet (40 mg total) by mouth daily.  Dispense: 90 tablet; Refill: 2  Intermittent palpitations -     Ambulatory referral to Cardiology    Return 4 mon f/u for BP Check, for move june appt to August f/u bpc.  Patient was given opportunity to ask questions. Patient verbalized understanding of the plan and was able to repeat key elements of the plan. All questions were answered to their satisfaction.    I, Melodie Spry, NP, have reviewed all documentation for this visit. The documentation on 10/04/23 for the exam, diagnosis, procedures, and orders are all accurate and complete.   IF YOU HAVE BEEN REFERRED TO A SPECIALIST, IT MAY TAKE 1-2 WEEKS TO SCHEDULE/PROCESS THE REFERRAL. IF YOU HAVE NOT HEARD FROM US /SPECIALIST IN TWO WEEKS, PLEASE GIVE US  A CALL AT [phone] X [phone].

## 2023-10-04 NOTE — Assessment & Plan Note (Signed)
 Chronic. Continue ozempic  1 mg SQ weekly

## 2023-10-04 NOTE — Assessment & Plan Note (Signed)
 Chronic. On Repatha  140 mg SQ biweekly

## 2023-10-05 LAB — CBC
Hematocrit: 42.8 % (ref 34.0–46.6)
Hemoglobin: 12.8 g/dL (ref 11.1–15.9)
MCH: 24.4 pg — ABNORMAL LOW (ref 26.6–33.0)
MCHC: 29.9 g/dL — ABNORMAL LOW (ref 31.5–35.7)
MCV: 82 fL (ref 79–97)
Platelets: 244 10*3/uL (ref 150–450)
RBC: 5.24 x10E6/uL (ref 3.77–5.28)
RDW: 14.7 % (ref 11.7–15.4)
WBC: 5.6 10*3/uL (ref 3.4–10.8)

## 2023-10-05 LAB — LIPID PANEL
Chol/HDL Ratio: 6.2 ratio — ABNORMAL HIGH (ref 0.0–4.4)
Cholesterol, Total: 247 mg/dL — ABNORMAL HIGH (ref 100–199)
HDL: 40 mg/dL (ref >39)
LDL Chol Calc (NIH): 175 mg/dL — ABNORMAL HIGH (ref 0–99)
Triglycerides: 173 mg/dL — ABNORMAL HIGH (ref 0–149)
VLDL Cholesterol Cal: 32 mg/dL (ref 5–40)

## 2023-10-05 LAB — CMP14+EGFR
ALT: 18 IU/L (ref 0–32)
AST: 19 IU/L (ref 0–40)
Albumin: 3.9 g/dL (ref 3.9–4.9)
Alkaline Phosphatase: 137 IU/L — ABNORMAL HIGH (ref 44–121)
BUN/Creatinine Ratio: 13 (ref 12–28)
BUN: 16 mg/dL (ref 8–27)
Bilirubin Total: 0.5 mg/dL (ref 0.0–1.2)
CO2: 21 mmol/L (ref 20–29)
Calcium: 9 mg/dL (ref 8.7–10.3)
Chloride: 104 mmol/L (ref 96–106)
Creatinine, Ser: 1.23 mg/dL — ABNORMAL HIGH (ref 0.57–1.00)
Globulin, Total: 3 g/dL (ref 1.5–4.5)
Glucose: 94 mg/dL (ref 70–99)
Potassium: 4.8 mmol/L (ref 3.5–5.2)
Sodium: 140 mmol/L (ref 134–144)
Total Protein: 6.9 g/dL (ref 6.0–8.5)
eGFR: 48 mL/min/{1.73_m2} — ABNORMAL LOW (ref >59)

## 2023-10-05 LAB — HEMOGLOBIN A1C
Est. average glucose Bld gHb Est-mCnc: 126 mg/dL
Hgb A1c MFr Bld: 6 % — ABNORMAL HIGH (ref 4.8–5.6)

## 2023-10-08 DIAGNOSIS — R002 Palpitations: Secondary | ICD-10-CM | POA: Insufficient documentation

## 2023-10-08 DIAGNOSIS — N1831 Chronic kidney disease, stage 3a: Secondary | ICD-10-CM | POA: Insufficient documentation

## 2023-10-10 ENCOUNTER — Ambulatory Visit: Admitting: Physical Therapy

## 2023-10-10 DIAGNOSIS — R2681 Unsteadiness on feet: Secondary | ICD-10-CM

## 2023-10-10 DIAGNOSIS — R42 Dizziness and giddiness: Secondary | ICD-10-CM

## 2023-10-10 DIAGNOSIS — M542 Cervicalgia: Secondary | ICD-10-CM | POA: Diagnosis not present

## 2023-10-10 NOTE — Therapy (Signed)
 OUTPATIENT PHYSICAL THERAPY VESTIBULAR TREATMENT     Patient Name: Monique Graham MRN: [id number] DOB:Mar 02, 1956, 68 y.o., female Today's Date: 10/10/2023  END OF SESSION:   PT End of Session - 10/10/23 1148     Visit Number 9    Number of Visits 25    Date for PT Re-Evaluation 11/19/23    PT Start Time 1148    PT Stop Time 1230    PT Time Calculation (min) 42 min    Equipment Utilized During Treatment Gait belt    Activity Tolerance Patient tolerated treatment well    Behavior During Therapy WFL for tasks assessed/performed                 Past Medical History:  Diagnosis Date   Acute kidney failure (HCC)    Atherosclerotic heart disease of native coronary artery without angina pectoris    Chronic kidney disease (CKD)    Congestive heart failure (CHF) (HCC)    Coronary atherosclerosis due to calcified coronary lesion    Cyst of left kidney    Diabetes mellitus without complication (HCC)    Diverticulosis of large intestine without perforation or abscess without bleeding    Endometrial intraepithelial neoplasia (EIN)    Enlarged heart    Folate deficiency anemia    Gastric ulcer    Hyperlipidemia    Hypertension    Migraine    Nonerosive esophageal reflux disease    Nonrheumatic mitral (valve) insufficiency    Obstructive sleep apnea    Osteonecrosis (HCC)    Osteoporosis    Other specified disorders of bone density and structure, multiple sites    Ovarian cyst    Polycystic ovary syndrome    Premature ventricular contractions    Restless leg syndrome    Vitamin B12 deficiency    Vitamin D deficiency    Past Surgical History:  Procedure Laterality Date   ABDOMINAL HYSTERECTOMY     OOPHORECTOMY     Patient Active Problem List   Diagnosis Date Noted   Intermittent palpitations 10/08/2023   Benign hypertension with stage 3a chronic kidney disease (HCC) 10/08/2023   Benign hypertension with CKD (chronic kidney disease) stage III (HCC)  10/04/2023   Class 3 severe obesity due to excess calories with serious comorbidity and body mass index (BMI) of 40.0 to 44.9 in adult 10/04/2023   Hair loss 06/20/2023   Sleep apnea 06/20/2023   Otalgia, left 04/10/2023   Vertigo 04/10/2023   Class 2 obesity due to excess calories with body mass index (BMI) of 38.0 to 38.9 in adult 04/10/2023   Non-seasonal allergic rhinitis 04/10/2023   Class 2 severe obesity due to excess calories with serious comorbidity and body mass index (BMI) of 39.0 to 39.9 in adult (HCC) 03/11/2023   Dyslipidemia due to type 2 diabetes mellitus (HCC) 03/11/2023   Type 2 diabetes mellitus with stage 3a chronic kidney disease, without long-term current use of insulin (HCC) 03/11/2023   Vitamin B12 deficiency 01/30/2023   Cystitis 01/30/2023   Acute right flank pain 01/30/2023   Establishing care with new doctor, encounter for 01/30/2023   Hyperlipidemia LDL goal <100 01/30/2023   Intractable chronic migraine without aura and without status migrainosus 01/30/2023   Hypertension 01/30/2023   Immunization due 01/30/2023    PCP: Melodie Spry, NP REFERRING PROVIDER: Lorane Rocker, PA-C  REFERRING DIAG: vertigo   THERAPY DIAG:  Cervicalgia  Dizziness and giddiness  Unsteadiness on feet  ONSET DATE: May 2024  Rationale for Evaluation  and Treatment: Rehabilitation  SUBJECTIVE:   SUBJECTIVE STATEMENT:   PCP visit on 10/04/2023: received cardiology referral  Pt states at PCP appointment they said her lab values for her cholesterol and kidneys were not good. Pt states they are starting her on a medication (injection) for her cholesterol because she has previously been allergic to the oral medications. Pt states she is going to continue trying to drink more water.   Pt states she did not take her clonidine  this morning because it makes her sleepy and she doesn't want to take it when she is going to drive, but pt states she did take her 2 other medications  (Olmesartan  medoxomil and Metoprolol Tartrate). Therapist encouraged pt to discuss this concern with her physician.  Pt states no dizziness symptoms that same day after last therapy session; however, states when doing her HEP at home the vertical head nod gaze stabilization is still what makes her symptomatic.  States when in kitchen she turned around and became so dizzy she had to hold onto something to keep her balance.   Denies any vertigo episodes, but does still get headache approximately every other day. Therapist encouraged her to seek referral to neurologist to help manage this.  From prior sessions: - pt on meclizine  for dizziness PRN - experiences "throat burning" with her dizziness - can have delayed dizziness response - when lying in bed at night if she sees the ceiling fan spinning around then it really causes her to be dizzy.  - Pt is not currently on medications for migraines, was previously on Topamax   - moved to [facility] last June 2024.    Pt accompanied by: self  PERTINENT HISTORY:  From Eval:  The pt is a pleasant 68 y/o female presenting to PT for vertigo/dizziness and imbalance. Pt reports onset of dizziness started May last year. She was driving and dizziness began out of nowhere. It lasted for 3 minutes at the time. She reports frequency of dizzy episodes has increased a bit more now since last year. She reports a headache and burning in her throat sensation that typically precedes vertigo. She always gets a headache before or during dizzy episodes.  Pt with hx of migraines. She takes ibuprofen or Tylenol to treat her symptoms. She used to have migraines frequently. She says now headaches seem more sinus-related. Headaches now are more mild and are felt in her sinuses or top of head. She does have ringing in her ears sometimes, sometimes occurs before headache, particularly in R ear, does have pain in L ear.  She reports she has been diagnosed with bilateral hearing  loss. She reports her balance is not good and states it is like her equilibrium is off, affects ability to walk up steps, walk around corners. She reports no falls in last 6 months.  She says her legs can hurt at night; this occurred when she took a particular statin. She reports her sleep is not good, she has sleep apnea but has not been using CPAP until yesterday.  PT just moved back to the area and does not have a neurologist here currently. Other information: Pt can have dizziness watching TV, no dizziness looking at her phone, no dizziness in busy stores, no issues with symptoms as passenger in a car. She gets seasick, used to like go fishing, but had to stop.  Pt likes to dance and cook, and she likes to travel.  Treadmills make her dizzy. She goes to the gym, does everything  except for treadmill/bikes.   PMH per chart significant for acute kidney failure, atherosclerotic heart disease of native coronary artery without angina pectoris, chronic kidney disease, congestive heart failure, DM, endometrial intraepithelial neoplasia, enlarged heart, folate deficiency anemia, HTN, OSA, osteonecrosis, osteoporosis, vitamin B12 deficiency, Vitamin D deficiency, migraines please refer to chart for full PMH  PAIN:  Are you having pain? Headache at baseline  PRECAUTIONS: None and Fall  RED FLAGS: None   WEIGHT BEARING RESTRICTIONS: No  FALLS: Has patient fallen in last 6 months? No  LIVING ENVIRONMENT: Lives with: lives with their spouse Stairs: 2 to get into her home no handrails, 12 inside has bilateral handrails Has following equipment at home:   PLOF: Independent  PATIENT GOALS: Get rid of the vertigo   OBJECTIVE:  Note: Objective measures were completed at Evaluation unless otherwise noted.  DIAGNOSTIC FINDINGS:  Via chart CT HEAD 03/14/23: " FINDINGS: Brain: No evidence of acute infarction, hemorrhage, hydrocephalus, extra-axial collection or mass lesion/mass effect. Similar  patchy white matter hypodensities are nonspecific but compatible with chronic microvascular ischemic disease.   Vascular: No hyperdense vessel.   Skull: No acute fracture.   Sinuses/Orbits: Clear sinuses.  No acute orbital findings.   Other: No mastoid effusions.   IMPRESSION: No evidence of acute intracranial abnormality.     Electronically Signed   By: Stevenson Elbe M.D.   On: 03/14/2023 12:18"  COGNITION: Overall cognitive status: Within functional limits for tasks assessed   SENSATION: Pt reports occ numbness/tingling in her legs -was present when taking statin   EDEMA:  Reports no swelling    POSTURE:  Rounded shoulders, slight increase kyphosis/fwd head  Cervical ROM:   AROM screen - formal assessment deferred, assessing to determine if pt has sufficient range of motion for vestibular tests, not for normative values  Rotation bilat WFL for testing - no pain Flex - WFL for testing and pain free Ext - WFL for testing - notices headache (has at baseline) with ext more than flex   STRENGTH: deferred    BED MOBILITY:  Bed mobility can make pt dizzy (rolling, supine>sit)  TRANSFERS: Assistive device utilized: None  Sit to stand: Complete Independence Stand to sit: Complete Independence Chair to chair: Complete Independence   GAIT: Gait pattern: increased variability in BOS with head turns/scanning, scanning impairs gait mechanics with pt becoming slightly unsteady Distance walked: clinic distances Assistive device utilized: None Level of assistance: SBA and CGA   FUNCTIONAL TESTS:  Dynamic Gait Index: 21/24  PATIENT SURVEYS:  DHI deferred to future visit  VESTIBULAR ASSESSMENT:   OCULOMOTOR EXAM:  Ocular Alignment: normal  Ocular ROM: No Limitations  Spontaneous Nystagmus: absent  Gaze-Induced Nystagmus: absent  Smooth Pursuits: intact - does make pt feel dizzy, described as lightheaded   Saccades: catch-up saccade (undershooting) with L  gaze, corrective saccade (undershooting) looking up - some symptoms but dizziness not as bas as with pursuits   Convergence/Divergence:  sees double 2" from face, converges, some dizziness (described as light-headed)   VESTIBULAR - OCULAR REFLEX:   Slow VOR: Normal - makes pt a little dizzy  VOR Cancellation: Normal - makes pt a little dizzy   Head-Impulse Test: Positive to L with corrective saccade - makes pt dizzy but also movement helps headache; positive to R with undershooting and corrective saccade    POSITIONAL TESTING: deferred to future visit  TREATMENT DATE: 10/10/23  Vitals in sitting to start session: BP 154/95 (MAP 113), HR 97bpm   Reinforced education to pt on importance of taking her medications as prescribed and at the same time daily as well as following up with physician regarding concerns of medications making her drowsy/sleepy.   B LE and B UE reciprocal movement pattern on Nustep for cardiovascular training against level 2 resistance for 2 minutes, level 3 for 3 minutes, level 4 for 1 minute totaling 6 minutes with pt maintaining SPM (steps per minute) > 75 for at least 85% of the time, totaling 512 steps.  Pt reports feeling her leg muscles working during this exercise Pt rates Borg RPE scale of 8/10  Re-assessed vitals after Nustep: BP 168/97 (MAP 114), HR 103bpm  Re-assessed following seated rest break: BP 168/97 (MAP 114), HR 98bpm   Standing Gaze Stabilization: Horizontal VOR 2x 30 sec Vertical VOR 2x 30 sec  Pt reports this as the most challenging of the gaze stabilization exercises, with pt having feelings of "wanting to throw up" but states "I'm not" Horizontal VOR cancellation 2x 30 sec Pt reports no symptoms with this Vertical VOR cancellation x30 sec Pt reports feeling some symptoms with this, but not as significant as vertical  VOR *overall pt with difficulty coordinating both eyes and head movements on all gaze stabilization exercises Requires close SBA for safety throughout due to slight postural sway  Pt maintaining wide BOS for stability  Vitals in sitting after gaze stabilization: BP 151/101 (MAP 115), HR 101bpm   Gait training ~257ft, no AD, with CGA/close SBA for safety while performing R/L head turns to identify cards held up by therapist - noticed mild postural sway initially that improves with repetition - focused on trying to turn at end without stopping and continuing to visually scan to find targets, this causes greatest instability Will benefit from additional practice of turning during gait   PATIENT EDUCATION:  Education details: exercise technique Person educated: Patient Education method: Explanation, Demonstration, and Verbal cues Education comprehension: verbalized understanding, returned demonstration, and needs further education  HOME EXERCISE PROGRAM:  Access Code: [email or web address] URL: [email or web address] Date: 10/03/2023 Prepared by: Carlen Chasten  Exercises - Walking  - 1 x daily - 3 x weekly - 1 sets - 3-6 minutes hold - Recumbent Bike  - 1 x daily - 3 x weekly - 1 sets - 6 minutes hold - Seated Scapular Retraction  - 1 x daily - 7 x weekly - 2 sets - 10 reps - Seated Shoulder Shrugs  - 1 x daily - 7 x weekly - 2 sets - 10 reps - Seated Gaze Stabilization with Head Rotation  - 1 x daily - 7 x weekly - 2 sets - 30 seconds hold - Seated Gaze Stabilization with Head Nod  - 1 x daily - 7 x weekly - 2 sets - 30 seconds hold    GOALS: Goals reviewed with patient? Yes, initiated, to continue with further assessment  SHORT TERM GOALS: Target date: 11/21/2023   Patient will be independent in home exercise program to improve balance, dizziness and mobility for increased QOL and ease with ADLs. Baseline: initiated  Goal status: INITIAL   LONG TERM GOALS: Target date:  01/02/2024    Patient will reduce dizziness handicap inventory score to <50, for less dizziness with ADLs and increased safety with home and work tasks. .  Baseline: 09/09/2023: 28 Goal status: INITIAL  2.  Patient will report at least a 30%  reduction in dizzy symptoms with provoking movements since start of PT to indicate increased ease with ADLs and mobility. Baseline: horizontal, vertical head movements, bed mobility and other movements currently make pt dizzy Goal status: INITIAL  3.  Patient will increase dynamic gait index score to at least 23/24 as to demonstrate reduced fall risk and improved dynamic gait balance for better safety with community/home ambulation.   Baseline: 21/24 most difficulty with head turn activities Goal status: INITIAL   ASSESSMENT:  CLINICAL IMPRESSION:  Patient arrives reporting no symptoms today and vitals mildly elevated, but stable enough to allow increased participation in therapy session. Therapist encouraged pt to follow-up with MD regarding her concerns of clonidine  making her sleepy as well as seeking referral to neurology to help manage her headaches. Patient able to participate in cardiovascular training and gaze stabilization exercises this session with goal of managing dizziness symptoms (remembering pt often experiences delayed symptoms). Patient continues to report feeling B LE muscle fatigue on Nustep and is able to work at an increased intensity today per Borg RPE scale. During gaze stabilization exercises pt reports feeling most symptomatic during vertical VOR and pt noted to have difficulty coordinating head and eye movements with all of these exercises. Patient able to participate in gait with horizontal head turns, to visually find specific target, with pt having mild imbalance that improved with repetition but greater imbalance when turning while visually scanning. Ms. Salas will benefit from further skilled PT to improve dizziness, gait,  balance and mobility in order to increase ease and safety with ADLs and QOL.  OBJECTIVE IMPAIRMENTS: Abnormal gait, decreased balance, difficulty walking, dizziness, impaired sensation, improper body mechanics, postural dysfunction, and pain.   ACTIVITY LIMITATIONS: bending, stairs, bed mobility, locomotion level, and activities in general can be impacted/impaired if pt having dizzy episode  PARTICIPATION LIMITATIONS: meal prep, cleaning, driving, shopping, community activity, yard work, and ADLs generally impaired it pt having dizzy episode  PERSONAL FACTORS: Age, Sex, Time since onset of injury/illness/exacerbation, and 3+ comorbidities: PMH per chart significant for acute kidney failure, atherosclerotic heart disease of native coronary artery without angina pectoris, chronic kidney disease, congestive heart failure, DM, endometrial intraepithelial neoplasia, enlarged heart, folate deficiency anemia, HTN, OSA, osteonecrosis, osteoporosis, vitamin B12 deficiency, Vitamin D deficiency, migraines please refer to chart for full PMH are also affecting patient's functional outcome.   REHAB POTENTIAL: Good  CLINICAL DECISION MAKING: Evolving/moderate complexity  EVALUATION COMPLEXITY: Moderate   PLAN:  PT FREQUENCY: 1-2x/week  PT DURATION: 12 weeks  PLANNED INTERVENTIONS: 97164- PT Re-evaluation, 97750- Physical Performance Testing, 97110-Therapeutic exercises, 97530- Therapeutic activity, [id number]- Neuromuscular re-education, 97535- Self Care, 96045- Manual therapy, [id number]- Gait training, [phone]- Orthotic Initial, [phone]- Orthotic/Prosthetic subsequent, [phone]- Canalith repositioning, [id number]- Electrical stimulation (unattended), [phone]- Electrical stimulation (manual), Patient/Family education, Balance training, Stair training, Dry Needling, Joint mobilization, Spinal mobilization, Vestibular training, DME instructions, Cryotherapy, and Moist heat  PLAN FOR NEXT SESSION:    *PROGRESS NOTE* *follow-up  on elevated BP and HR from 5/20 apt - pt seeing PCP on 10/04/23*  - follow-up on recommendations for: - increased daily water intake - set-up with cardiologist for BP management and follow-up pt reports of "calcifications" in arteries of her neck - recheck orthostatic hypotension if symptomatic  - SBP dropped by 13 on 09/26/2023 - continue cardio conditioning followed by gaze stabilization  - reinforce education on working at 5/10 on Borg RPE scale - advance HEP as indicated - balance interventions - dynamic gait training with turning  Carlen Chasten, PT, DPT, NCS, CSRS Physical Therapist - [place]  [facility]  12:36 PM 10/10/23

## 2023-10-16 ENCOUNTER — Ambulatory Visit: Attending: Physician Assistant

## 2023-10-16 DIAGNOSIS — M542 Cervicalgia: Secondary | ICD-10-CM | POA: Insufficient documentation

## 2023-10-16 DIAGNOSIS — R2681 Unsteadiness on feet: Secondary | ICD-10-CM | POA: Insufficient documentation

## 2023-10-16 DIAGNOSIS — R42 Dizziness and giddiness: Secondary | ICD-10-CM | POA: Diagnosis present

## 2023-10-16 DIAGNOSIS — M6281 Muscle weakness (generalized): Secondary | ICD-10-CM | POA: Insufficient documentation

## 2023-10-16 NOTE — Therapy (Signed)
 OUTPATIENT PHYSICAL THERAPY VESTIBULAR TREATMENT/Physical Therapy Progress Note   Dates of reporting period  08/27/2023   to   10/16/2023      Patient Name: Monique Graham MRN: [id number] DOB:[birth year], 68 y.o., female Today's Date: 10/16/2023  END OF SESSION:   PT End of Session - 10/16/23 0846     Visit Number 10    Number of Visits 25    Date for PT Re-Evaluation 11/19/23    PT Start Time 0849    PT Stop Time 0929    PT Time Calculation (min) 40 min    Equipment Utilized During Treatment Gait belt    Activity Tolerance Patient tolerated treatment well    Behavior During Therapy WFL for tasks assessed/performed                 Past Medical History:  Diagnosis Date   Acute kidney failure (HCC)    Atherosclerotic heart disease of native coronary artery without angina pectoris    Chronic kidney disease (CKD)    Congestive heart failure (CHF) (HCC)    Coronary atherosclerosis due to calcified coronary lesion    Cyst of left kidney    Diabetes mellitus without complication (HCC)    Diverticulosis of large intestine without perforation or abscess without bleeding    Endometrial intraepithelial neoplasia (EIN)    Enlarged heart    Folate deficiency anemia    Gastric ulcer    Hyperlipidemia    Hypertension    Migraine    Nonerosive esophageal reflux disease    Nonrheumatic mitral (valve) insufficiency    Obstructive sleep apnea    Osteonecrosis (HCC)    Osteoporosis    Other specified disorders of bone density and structure, multiple sites    Ovarian cyst    Polycystic ovary syndrome    Premature ventricular contractions    Restless leg syndrome    Vitamin B12 deficiency    Vitamin D deficiency    Past Surgical History:  Procedure Laterality Date   ABDOMINAL HYSTERECTOMY     OOPHORECTOMY     Patient Active Problem List   Diagnosis Date Noted   Intermittent palpitations 10/08/2023   Benign hypertension with stage 3a chronic kidney disease  (HCC) 10/08/2023   Benign hypertension with CKD (chronic kidney disease) stage III (HCC) 10/04/2023   Class 3 severe obesity due to excess calories with serious comorbidity and body mass index (BMI) of 40.0 to 44.9 in adult 10/04/2023   Hair loss 06/20/2023   Sleep apnea 06/20/2023   Otalgia, left 04/10/2023   Vertigo 04/10/2023   Class 2 obesity due to excess calories with body mass index (BMI) of 38.0 to 38.9 in adult 04/10/2023   Non-seasonal allergic rhinitis 04/10/2023   Class 2 severe obesity due to excess calories with serious comorbidity and body mass index (BMI) of 39.0 to 39.9 in adult (HCC) 03/11/2023   Dyslipidemia due to type 2 diabetes mellitus (HCC) 03/11/2023   Type 2 diabetes mellitus with stage 3a chronic kidney disease, without long-term current use of insulin (HCC) 03/11/2023   Vitamin B12 deficiency 01/30/2023   Cystitis 01/30/2023   Acute right flank pain 01/30/2023   Establishing care with new doctor, encounter for 01/30/2023   Hyperlipidemia LDL goal <100 01/30/2023   Intractable chronic migraine without aura and without status migrainosus 01/30/2023   Hypertension 01/30/2023   Immunization due 01/30/2023    PCP: Melodie Spry, NP REFERRING PROVIDER: Lorane Rocker, PA-C  REFERRING DIAG: vertigo   THERAPY DIAG:  Dizziness and giddiness  Unsteadiness on feet  Muscle weakness (generalized)  ONSET DATE: May 2024  Rationale for Evaluation and Treatment: Rehabilitation  SUBJECTIVE:   SUBJECTIVE STATEMENT: Pt still having headaches every day, reports BP is up & and down. She had her BP medication refilled. She reports on avg BP has been 135/90 mmHg. She has been dizzy but has not had a recent bout of vertigo. She reports it has been 3 weeks since she's had vertigo. She says she gets a little dizzy and nauseous when you stand and sometimes when she is sitting down. Sometimes she feels it after eating.       PCP visit on 10/04/2023: received cardiology  referral  Pt states at PCP appointment they said her lab values for her cholesterol and kidneys were not good. Pt states they are starting her on a medication (injection) for her cholesterol because she has previously been allergic to the oral medications. Pt states she is going to continue trying to drink more water.   Pt states she did not take her clonidine  this morning because it makes her sleepy and she doesn't want to take it when she is going to drive, but pt states she did take her 2 other medications (Olmesartan  medoxomil and Metoprolol  Tartrate). Therapist encouraged pt to discuss this concern with her physician.  Pt states no dizziness symptoms that same day after last therapy session; however, states when doing her HEP at home the vertical head nod gaze stabilization is still what makes her symptomatic.  States when in kitchen she turned around and became so dizzy she had to hold onto something to keep her balance.   Denies any vertigo episodes, but does still get headache approximately every other day. Therapist encouraged her to seek referral to neurologist to help manage this.  From prior sessions: - pt on meclizine  for dizziness PRN - experiences "throat burning" with her dizziness - can have delayed dizziness response - when lying in bed at night if she sees the ceiling fan spinning around then it really causes her to be dizzy.  - Pt is not currently on medications for migraines, was previously on Topamax   - moved to [facility] last June 2024.    Pt accompanied by: self  PERTINENT HISTORY:  From Eval:  The pt is a pleasant 68 y/o female presenting to PT for vertigo/dizziness and imbalance. Pt reports onset of dizziness started May last year. She was driving and dizziness began out of nowhere. It lasted for 3 minutes at the time. She reports frequency of dizzy episodes has increased a bit more now since last year. She reports a headache and burning in her throat sensation  that typically precedes vertigo. She always gets a headache before or during dizzy episodes.  Pt with hx of migraines. She takes ibuprofen or Tylenol to treat her symptoms. She used to have migraines frequently. She says now headaches seem more sinus-related. Headaches now are more mild and are felt in her sinuses or top of head. She does have ringing in her ears sometimes, sometimes occurs before headache, particularly in R ear, does have pain in L ear.  She reports she has been diagnosed with bilateral hearing loss. She reports her balance is not good and states it is like her equilibrium is off, affects ability to walk up steps, walk around corners. She reports no falls in last 6 months.  She says her legs can hurt at night; this occurred when she took a  particular statin. She reports her sleep is not good, she has sleep apnea but has not been using CPAP until yesterday.  PT just moved back to the area and does not have a neurologist here currently. Other information: Pt can have dizziness watching TV, no dizziness looking at her phone, no dizziness in busy stores, no issues with symptoms as passenger in a car. She gets seasick, used to like go fishing, but had to stop.  Pt likes to dance and cook, and she likes to travel.  Treadmills make her dizzy. She goes to the gym, does everything except for treadmill/bikes.   PMH per chart significant for acute kidney failure, atherosclerotic heart disease of native coronary artery without angina pectoris, chronic kidney disease, congestive heart failure, DM, endometrial intraepithelial neoplasia, enlarged heart, folate deficiency anemia, HTN, OSA, osteonecrosis, osteoporosis, vitamin B12 deficiency, Vitamin D deficiency, migraines please refer to chart for full PMH  PAIN:  Are you having pain? Headache at baseline  PRECAUTIONS: None and Fall  RED FLAGS: None   WEIGHT BEARING RESTRICTIONS: No  FALLS: Has patient fallen in last 6 months? No  LIVING  ENVIRONMENT: Lives with: lives with their spouse Stairs: 2 to get into her home no handrails, 12 inside has bilateral handrails Has following equipment at home:   PLOF: Independent  PATIENT GOALS: Get rid of the vertigo   OBJECTIVE:  Note: Objective measures were completed at Evaluation unless otherwise noted.  DIAGNOSTIC FINDINGS:  Via chart CT HEAD 03/14/23: " FINDINGS: Brain: No evidence of acute infarction, hemorrhage, hydrocephalus, extra-axial collection or mass lesion/mass effect. Similar patchy white matter hypodensities are nonspecific but compatible with chronic microvascular ischemic disease.   Vascular: No hyperdense vessel.   Skull: No acute fracture.   Sinuses/Orbits: Clear sinuses.  No acute orbital findings.   Other: No mastoid effusions.   IMPRESSION: No evidence of acute intracranial abnormality.     Electronically Signed   By: Stevenson Elbe M.D.   On: 03/14/2023 12:18"  COGNITION: Overall cognitive status: Within functional limits for tasks assessed   SENSATION: Pt reports occ numbness/tingling in her legs -was present when taking statin   EDEMA:  Reports no swelling    POSTURE:  Rounded shoulders, slight increase kyphosis/fwd head  Cervical ROM:   AROM screen - formal assessment deferred, assessing to determine if pt has sufficient range of motion for vestibular tests, not for normative values  Rotation bilat WFL for testing - no pain Flex - WFL for testing and pain free Ext - WFL for testing - notices headache (has at baseline) with ext more than flex   STRENGTH: deferred    BED MOBILITY:  Bed mobility can make pt dizzy (rolling, supine>sit)  TRANSFERS: Assistive device utilized: None  Sit to stand: Complete Independence Stand to sit: Complete Independence Chair to chair: Complete Independence   GAIT: Gait pattern: increased variability in BOS with head turns/scanning, scanning impairs gait mechanics with pt becoming  slightly unsteady Distance walked: clinic distances Assistive device utilized: None Level of assistance: SBA and CGA   FUNCTIONAL TESTS:  Dynamic Gait Index: 21/24  PATIENT SURVEYS:  DHI deferred to future visit  VESTIBULAR ASSESSMENT:   OCULOMOTOR EXAM:  Ocular Alignment: normal  Ocular ROM: No Limitations  Spontaneous Nystagmus: absent  Gaze-Induced Nystagmus: absent  Smooth Pursuits: intact - does make pt feel dizzy, described as lightheaded   Saccades: catch-up saccade (undershooting) with L gaze, corrective saccade (undershooting) looking up - some symptoms but dizziness not as bas as  with pursuits   Convergence/Divergence:  sees double 2" from face, converges, some dizziness (described as light-headed)   VESTIBULAR - OCULAR REFLEX:   Slow VOR: Normal - makes pt a little dizzy  VOR Cancellation: Normal - makes pt a little dizzy   Head-Impulse Test: Positive to L with corrective saccade - makes pt dizzy but also movement helps headache; positive to R with undershooting and corrective saccade    POSITIONAL TESTING: deferred to future visit                                                                                                                           TREATMENT DATE: 10/16/23  Self-Care/Home Management: Symptom tracker tool. Provided 7 copies and reviewed use  BP prior to reassessment: Seated: 132/86 mmHg, HR 86 bpm  Reviewed findings with pt  Physical Performance: PT instructed pt in DGI. See below for results. Demonstrates increased fall risk with score of 20/24. (<19 indicates increased fall risk)   [facility] PT Assessment - 10/16/23 0001       Dynamic Gait Index   Level Surface Normal    Change in Gait Speed Normal    Gait with Horizontal Head Turns Mild Impairment    Gait with Vertical Head Turns Mild Impairment    Gait and Pivot Turn Mild Impairment    Step Over Obstacle Normal    Step Around Obstacles Normal    Steps Mild Impairment    Total Score  20           PT reviewed findings with pt  NMR: DHI - 48  Habituation with addition of cardio conditioning on nustep: Lvl 1 x 1 min  Lvl 2 x 1 min 30 sec - requested decrease in intensity due to LE mm fatigue Lvl 1 x 2 min 30 sec  2 brief rest breaks during intervention due to fatigue SPM 70s-90s  Gait with vertical head turns, completed over 10 meters, turning head eery 5-8 steps. Pt least symptomatic with waiting to turn head after 8 steps.   Please see goal section below for other goal reassessment/questions   PATIENT EDUCATION:  Education details: exercise technique, goal reassessment, symptom tracker Person educated: Patient Education method: Explanation, Demonstration, and Verbal cues, handout  Education comprehension: verbalized understanding, returned demonstration, and needs further education  HOME EXERCISE PROGRAM:  Access Code: [email or web address] URL: [email or web address] Date: 10/03/2023 Prepared by: Carlen Chasten  Exercises - Walking  - 1 x daily - 3 x weekly - 1 sets - 3-6 minutes hold - Recumbent Bike  - 1 x daily - 3 x weekly - 1 sets - 6 minutes hold - Seated Scapular Retraction  - 1 x daily - 7 x weekly - 2 sets - 10 reps - Seated Shoulder Shrugs  - 1 x daily - 7 x weekly - 2 sets - 10 reps - Seated Gaze Stabilization with Head Rotation  - 1 x daily - 7 x weekly - 2 sets -  30 seconds hold - Seated Gaze Stabilization with Head Nod  - 1 x daily - 7 x weekly - 2 sets - 30 seconds hold    GOALS: Goals reviewed with patient? Yes, initiated, to continue with further assessment  SHORT TERM GOALS: Target date: 11/27/2023   Patient will be independent in home exercise program to improve balance, dizziness and mobility for increased QOL and ease with ADLs. Baseline: initiated; 6/4: Pt doing HEP every day Goal status: MET   LONG TERM GOALS: Target date: 01/08/2024    Patient will reduce dizziness handicap inventory score to <50, for less dizziness  with ADLs and increased safety with home and work tasks. .  Baseline: 09/09/2023: 28;  6/4: 48 Goal status: ONGOING  2.  Patient will report at least a 30% reduction in dizzy symptoms with provoking movements since start of PT to indicate increased ease with ADLs and mobility. Baseline: horizontal, vertical head movements, bed mobility and other movements currently make pt dizzy; 10/16/22: symptoms unchanged/dizziness with these activities, but no vertigo episodes  Goal status: ONGOING  3.  Patient will increase dynamic gait index score to at least 23/24 as to demonstrate reduced fall risk and improved dynamic gait balance for better safety with community/home ambulation.   Baseline: 21/24 most difficulty with head turn activities; 10/16/22: 20/24 Goal status: ONGOING   ASSESSMENT:  CLINICAL IMPRESSION:  Goals reassessed for progress visit. Pt with similar but slight decrease on DGI. Pt score on DHI increased to 48, indicating increased perception of handicap due to dizziness. However, pt did report while still dizzy, she has not experienced vertigo in at least three weeks. She still experiences dizziness with horizontal and vertical head movements and bed mobility. During appointment pt also exhibited quick onset of fatigue with LE endurance and cardiovascular habituation activity on nustep, which I suspect is impacting continued severity of symptoms, and pt would likely benefit from continuing a conditioning program with strengthening. Patient's condition has the potential to improve in response to therapy. Maximum improvement is yet to be obtained. The anticipated improvement is attainable and reasonable in a generally predictable time.  Patient reports   Ms. Pollak will benefit from further skilled PT to improve dizziness, gait, balance and mobility in order to increase ease and safety with ADLs and QOL.  OBJECTIVE IMPAIRMENTS: Abnormal gait, decreased balance, difficulty walking, dizziness,  impaired sensation, improper body mechanics, postural dysfunction, and pain.   ACTIVITY LIMITATIONS: bending, stairs, bed mobility, locomotion level, and activities in general can be impacted/impaired if pt having dizzy episode  PARTICIPATION LIMITATIONS: meal prep, cleaning, driving, shopping, community activity, yard work, and ADLs generally impaired it pt having dizzy episode  PERSONAL FACTORS: Age, Sex, Time since onset of injury/illness/exacerbation, and 3+ comorbidities: PMH per chart significant for acute kidney failure, atherosclerotic heart disease of native coronary artery without angina pectoris, chronic kidney disease, congestive heart failure, DM, endometrial intraepithelial neoplasia, enlarged heart, folate deficiency anemia, HTN, OSA, osteonecrosis, osteoporosis, vitamin B12 deficiency, Vitamin D deficiency, migraines please refer to chart for full PMH are also affecting patient's functional outcome.   REHAB POTENTIAL: Good  CLINICAL DECISION MAKING: Evolving/moderate complexity  EVALUATION COMPLEXITY: Moderate   PLAN:  PT FREQUENCY: 1-2x/week  PT DURATION: 12 weeks  PLANNED INTERVENTIONS: 97164- PT Re-evaluation, 97750- Physical Performance Testing, 97110-Therapeutic exercises, 97530- Therapeutic activity, [id number]- Neuromuscular re-education, 97535- Self Care, 56213- Manual therapy, [id number]- Gait training, [id number]- Orthotic Initial, [id number]- Orthotic/Prosthetic subsequent, [phone]- Canalith repositioning, [id number]- Electrical stimulation (unattended), [id number]- Electrical stimulation (manual), Patient/Family  education, Balance training, Stair training, Dry Needling, Joint mobilization, Spinal mobilization, Vestibular training, DME instructions, Cryotherapy, and Moist heat  PLAN FOR NEXT SESSION:    *PROGRESS NOTE* *follow-up on elevated BP and HR from 5/20 apt - pt seeing PCP on 10/04/23*  - follow-up on recommendations for: - increased daily water intake - set-up with cardiologist for BP  management and follow-up pt reports of "calcifications" in arteries of her neck - recheck orthostatic hypotension if symptomatic  - SBP dropped by 13 on 09/26/2023 - continue cardio conditioning followed by gaze stabilization  - reinforce education on working at 5/10 on Borg RPE scale - advance HEP as indicated - balance interventions - dynamic gait training with turning Continue plan   Aminta Kales PT, DPT  Physical Therapist - [facility]  [facility]  9:42 AM 10/16/23

## 2023-10-18 ENCOUNTER — Ambulatory Visit: Payer: Medicare Other | Admitting: Family Medicine

## 2023-10-23 ENCOUNTER — Ambulatory Visit

## 2023-10-23 DIAGNOSIS — R42 Dizziness and giddiness: Secondary | ICD-10-CM | POA: Diagnosis not present

## 2023-10-23 DIAGNOSIS — M6281 Muscle weakness (generalized): Secondary | ICD-10-CM

## 2023-10-23 NOTE — Therapy (Signed)
 OUTPATIENT PHYSICAL THERAPY VESTIBULAR TREATMENT/Physical Therapy Progress Note   Dates of reporting period  08/27/2023   to   10/16/2023      Patient Name: Monique Graham MRN: [id number] DOB:08-12-1955, 68 y.o., female Today's Date: 10/23/2023  END OF SESSION:         Past Medical History:  Diagnosis Date   Acute kidney failure (HCC)    Atherosclerotic heart disease of native coronary artery without angina pectoris    Chronic kidney disease (CKD)    Congestive heart failure (CHF) (HCC)    Coronary atherosclerosis due to calcified coronary lesion    Cyst of left kidney    Diabetes mellitus without complication (HCC)    Diverticulosis of large intestine without perforation or abscess without bleeding    Endometrial intraepithelial neoplasia (EIN)    Enlarged heart    Folate deficiency anemia    Gastric ulcer    Hyperlipidemia    Hypertension    Migraine    Nonerosive esophageal reflux disease    Nonrheumatic mitral (valve) insufficiency    Obstructive sleep apnea    Osteonecrosis (HCC)    Osteoporosis    Other specified disorders of bone density and structure, multiple sites    Ovarian cyst    Polycystic ovary syndrome    Premature ventricular contractions    Restless leg syndrome    Vitamin B12 deficiency    Vitamin D deficiency    Past Surgical History:  Procedure Laterality Date   ABDOMINAL HYSTERECTOMY     OOPHORECTOMY     Patient Active Problem List   Diagnosis Date Noted   Intermittent palpitations 10/08/2023   Benign hypertension with stage 3a chronic kidney disease (HCC) 10/08/2023   Benign hypertension with CKD (chronic kidney disease) stage III (HCC) 10/04/2023   Class 3 severe obesity due to excess calories with serious comorbidity and body mass index (BMI) of 40.0 to 44.9 in adult 10/04/2023   Hair loss 06/20/2023   Sleep apnea 06/20/2023   Otalgia, left 04/10/2023   Vertigo 04/10/2023   Class 2 obesity due to excess calories with  body mass index (BMI) of 38.0 to 38.9 in adult 04/10/2023   Non-seasonal allergic rhinitis 04/10/2023   Class 2 severe obesity due to excess calories with serious comorbidity and body mass index (BMI) of 39.0 to 39.9 in adult (HCC) 03/11/2023   Dyslipidemia due to type 2 diabetes mellitus (HCC) 03/11/2023   Type 2 diabetes mellitus with stage 3a chronic kidney disease, without long-term current use of insulin (HCC) 03/11/2023   Vitamin B12 deficiency 01/30/2023   Cystitis 01/30/2023   Acute right flank pain 01/30/2023   Establishing care with new doctor, encounter for 01/30/2023   Hyperlipidemia LDL goal <100 01/30/2023   Intractable chronic migraine without aura and without status migrainosus 01/30/2023   Hypertension 01/30/2023   Immunization due 01/30/2023    PCP: Melodie Spry, NP REFERRING PROVIDER: Lorane Rocker, PA-C  REFERRING DIAG: vertigo   THERAPY DIAG:  No diagnosis found.  ONSET DATE: May 2024  Rationale for Evaluation and Treatment: Rehabilitation  SUBJECTIVE:   SUBJECTIVE STATEMENT: Pt reports lots of dizziness this weekend, feels like her brain freezes, she reports no vertigo. She says she just looks straight ahead and like she's stuck. She still deals with HA and burning in throat sensation. PT recommends pt establish care with neurologist/headache specialist. Pt agreeable to this plan.   From prior sessions: - pt on meclizine  for dizziness PRN - experiences throat burning with her dizziness -  can have delayed dizziness response - when lying in bed at night if she sees the ceiling fan spinning around then it really causes her to be dizzy.  - Pt is not currently on medications for migraines, was previously on Topamax   - moved to [facility] last June 2024.    Pt accompanied by: self  PERTINENT HISTORY:  From Eval:  The pt is a pleasant 68 y/o female presenting to PT for vertigo/dizziness and imbalance. Pt reports onset of dizziness started May last  year. She was driving and dizziness began out of nowhere. It lasted for 3 minutes at the time. She reports frequency of dizzy episodes has increased a bit more now since last year. She reports a headache and burning in her throat sensation that typically precedes vertigo. She always gets a headache before or during dizzy episodes.  Pt with hx of migraines. She takes ibuprofen or Tylenol to treat her symptoms. She used to have migraines frequently. She says now headaches seem more sinus-related. Headaches now are more mild and are felt in her sinuses or top of head. She does have ringing in her ears sometimes, sometimes occurs before headache, particularly in R ear, does have pain in L ear.  She reports she has been diagnosed with bilateral hearing loss. She reports her balance is not good and states it is like her equilibrium is off, affects ability to walk up steps, walk around corners. She reports no falls in last 6 months.  She says her legs can hurt at night; this occurred when she took a particular statin. She reports her sleep is not good, she has sleep apnea but has not been using CPAP until yesterday.  PT just moved back to the area and does not have a neurologist here currently. Other information: Pt can have dizziness watching TV, no dizziness looking at her phone, no dizziness in busy stores, no issues with symptoms as passenger in a car. She gets seasick, used to like go fishing, but had to stop.  Pt likes to dance and cook, and she likes to travel.  Treadmills make her dizzy. She goes to the gym, does everything except for treadmill/bikes.   PMH per chart significant for acute kidney failure, atherosclerotic heart disease of native coronary artery without angina pectoris, chronic kidney disease, congestive heart failure, DM, endometrial intraepithelial neoplasia, enlarged heart, folate deficiency anemia, HTN, OSA, osteonecrosis, osteoporosis, vitamin B12 deficiency, Vitamin D deficiency, migraines  please refer to chart for full PMH  PAIN:  Are you having pain? Headache at baseline  PRECAUTIONS: None and Fall  RED FLAGS: None   WEIGHT BEARING RESTRICTIONS: No  FALLS: Has patient fallen in last 6 months? No  LIVING ENVIRONMENT: Lives with: lives with their spouse Stairs: 2 to get into her home no handrails, 12 inside has bilateral handrails Has following equipment at home:   PLOF: Independent  PATIENT GOALS: Get rid of the vertigo   OBJECTIVE:  Note: Objective measures were completed at Evaluation unless otherwise noted.  DIAGNOSTIC FINDINGS:  Via chart CT HEAD 03/14/23:  FINDINGS: Brain: No evidence of acute infarction, hemorrhage, hydrocephalus, extra-axial collection or mass lesion/mass effect. Similar patchy white matter hypodensities are nonspecific but compatible with chronic microvascular ischemic disease.   Vascular: No hyperdense vessel.   Skull: No acute fracture.   Sinuses/Orbits: Clear sinuses.  No acute orbital findings.   Other: No mastoid effusions.   IMPRESSION: No evidence of acute intracranial abnormality.     Electronically Signed  By: Stevenson Elbe M.D.   On: 03/14/2023 12:18  COGNITION: Overall cognitive status: Within functional limits for tasks assessed   SENSATION: Pt reports occ numbness/tingling in her legs -was present when taking statin   EDEMA:  Reports no swelling    POSTURE:  Rounded shoulders, slight increase kyphosis/fwd head  Cervical ROM:   AROM screen - formal assessment deferred, assessing to determine if pt has sufficient range of motion for vestibular tests, not for normative values  Rotation bilat WFL for testing - no pain Flex - WFL for testing and pain free Ext - WFL for testing - notices headache (has at baseline) with ext more than flex   STRENGTH: 10/23/23 MMT UE/LE  -UE strength grossly 4+/5 bilat UE  -LE strength grossly 4+/5 bilat LE, greatest defictis proximal mm    BED MOBILITY:   Bed mobility can make pt dizzy (rolling, supine>sit)  TRANSFERS: Assistive device utilized: None  Sit to stand: Complete Independence Stand to sit: Complete Independence Chair to chair: Complete Independence   GAIT: Gait pattern: increased variability in BOS with head turns/scanning, scanning impairs gait mechanics with pt becoming slightly unsteady Distance walked: clinic distances Assistive device utilized: None Level of assistance: SBA and CGA   FUNCTIONAL TESTS:  Dynamic Gait Index: 21/24  PATIENT SURVEYS:  DHI deferred to future visit  VESTIBULAR ASSESSMENT:   OCULOMOTOR EXAM:  Ocular Alignment: normal  Ocular ROM: No Limitations  Spontaneous Nystagmus: absent  Gaze-Induced Nystagmus: absent  Smooth Pursuits: intact - does make pt feel dizzy, described as lightheaded   Saccades: catch-up saccade (undershooting) with L gaze, corrective saccade (undershooting) looking up - some symptoms but dizziness not as bas as with pursuits   Convergence/Divergence:  sees double 2 from face, converges, some dizziness (described as light-headed)   VESTIBULAR - OCULAR REFLEX:   Slow VOR: Normal - makes pt a little dizzy  VOR Cancellation: Normal - makes pt a little dizzy   Head-Impulse Test: Positive to L with corrective saccade - makes pt dizzy but also movement helps headache; positive to R with undershooting and corrective saccade    POSITIONAL TESTING: deferred to future visit                                                                                                                           TREATMENT DATE: 10/23/23   TE- MMT UE/LE  -UE strength grossly 4+/5 bilat UE  -LE strength grossly 4+/5 bilat LE, greatest deficits proximal mm  TA Access Code: [email or web address] URL: [email or web address] Date: 10/23/2023 Prepared by: Aminta Kales  Exercises - Sit to Stand  - 1 x daily - 5 x weekly - 3 sets - 10 reps - Standing Hip Abduction with Counter Support  - 1  x daily - 5 x weekly - 2 sets - 10 reps - Standing March with Counter Support  - 1 x daily - 5 x weekly - 2 sets - 10 reps Comments Pt reports more fatiguing  for LLE than RLE   Cardio endurance/LE endurance training emphasis  Lvl 1 x 1 min  Lvl 2 x 5 min SPM 70s-100s  NMR:  Gait with horizontal head turns, completed over 10 meters, turning head eery 5-8 steps x 3 rounds. No increased dizziness but increase in variability of BOS   PATIENT EDUCATION:  Education details: exercise technique, goal reassessment, symptom tracker Person educated: Patient Education method: Explanation, Demonstration, and Verbal cues, handout  Education comprehension: verbalized understanding, returned demonstration, and needs further education  HOME EXERCISE PROGRAM:  Access Code: [email or web address] URL: [email or web address] Date: 10/03/2023 Prepared by: Carlen Chasten  Exercises - Walking  - 1 x daily - 3 x weekly - 1 sets - 3-6 minutes hold - Recumbent Bike  - 1 x daily - 3 x weekly - 1 sets - 6 minutes hold - Seated Scapular Retraction  - 1 x daily - 7 x weekly - 2 sets - 10 reps - Seated Shoulder Shrugs  - 1 x daily - 7 x weekly - 2 sets - 10 reps - Seated Gaze Stabilization with Head Rotation  - 1 x daily - 7 x weekly - 2 sets - 30 seconds hold - Seated Gaze Stabilization with Head Nod  - 1 x daily - 7 x weekly - 2 sets - 30 seconds hold    GOALS: Goals reviewed with patient? Yes, initiated, to continue with further assessment  SHORT TERM GOALS: Target date: 12/04/2023   Patient will be independent in home exercise program to improve balance, dizziness and mobility for increased QOL and ease with ADLs. Baseline: initiated; 6/4: Pt doing HEP every day Goal status: MET   LONG TERM GOALS: Target date: 01/15/2024    Patient will reduce dizziness handicap inventory score to <50, for less dizziness with ADLs and increased safety with home and work tasks. .  Baseline: 09/09/2023: 28;  6/4:  48 Goal status: ONGOING  2.  Patient will report at least a 30% reduction in dizzy symptoms with provoking movements since start of PT to indicate increased ease with ADLs and mobility. Baseline: horizontal, vertical head movements, bed mobility and other movements currently make pt dizzy; 10/16/22: symptoms unchanged/dizziness with these activities, but no vertigo episodes  Goal status: ONGOING  3.  Patient will increase dynamic gait index score to at least 23/24 as to demonstrate reduced fall risk and improved dynamic gait balance for better safety with community/home ambulation.   Baseline: 21/24 most difficulty with head turn activities; 10/16/22: 20/24 Goal status: ONGOING   ASSESSMENT:  CLINICAL IMPRESSION:  Improved LE endurance AEB increased time at higher resistance on nustep today. PT completed MMT of UE/LE for more target conditioning program. HEP updated to address LE muscular deficits, plan to target UE future visits Ms. Dishner will benefit from further skilled PT to improve dizziness, gait, balance and mobility in order to increase ease and safety with ADLs and QOL.  OBJECTIVE IMPAIRMENTS: Abnormal gait, decreased balance, difficulty walking, dizziness, impaired sensation, improper body mechanics, postural dysfunction, and pain.   ACTIVITY LIMITATIONS: bending, stairs, bed mobility, locomotion level, and activities in general can be impacted/impaired if pt having dizzy episode  PARTICIPATION LIMITATIONS: meal prep, cleaning, driving, shopping, community activity, yard work, and ADLs generally impaired it pt having dizzy episode  PERSONAL FACTORS: Age, Sex, Time since onset of injury/illness/exacerbation, and 3+ comorbidities: PMH per chart significant for acute kidney failure, atherosclerotic heart disease of native coronary artery without angina pectoris, chronic kidney disease, congestive heart failure, DM,  endometrial intraepithelial neoplasia, enlarged heart, folate deficiency  anemia, HTN, OSA, osteonecrosis, osteoporosis, vitamin B12 deficiency, Vitamin D deficiency, migraines please refer to chart for full PMH are also affecting patient's functional outcome.   REHAB POTENTIAL: Good  CLINICAL DECISION MAKING: Evolving/moderate complexity  EVALUATION COMPLEXITY: Moderate   PLAN:  PT FREQUENCY: 1-2x/week  PT DURATION: 12 weeks  PLANNED INTERVENTIONS: 97164- PT Re-evaluation, 97750- Physical Performance Testing, 97110-Therapeutic exercises, 97530- Therapeutic activity, [id number]- Neuromuscular re-education, 97535- Self Care, 40981- Manual therapy, [id number]- Gait training, [phone]- Orthotic Initial, [phone]- Orthotic/Prosthetic subsequent, [phone]- Canalith repositioning, [id number]- Electrical stimulation (unattended), [phone]- Electrical stimulation (manual), Patient/Family education, Balance training, Stair training, Dry Needling, Joint mobilization, Spinal mobilization, Vestibular training, DME instructions, Cryotherapy, and Moist heat  PLAN FOR NEXT SESSION:    *PROGRESS NOTE* *follow-up on elevated BP and HR from 5/20 apt - pt seeing PCP on 10/04/23*  - follow-up on recommendations for: - increased daily water intake - set-up with cardiologist for BP management and follow-up pt reports of calcifications in arteries of her neck - recheck orthostatic hypotension if symptomatic  - SBP dropped by 13 on 09/26/2023 - continue cardio conditioning followed by gaze stabilization  - reinforce education on working at 5/10 on Borg RPE scale - advance HEP as indicated - balance interventions - dynamic gait training with turning Continue plan   Aminta Kales PT, DPT  Physical Therapist - [facility]  [facility]  8:51 AM 10/23/23

## 2023-10-28 ENCOUNTER — Ambulatory Visit (INDEPENDENT_AMBULATORY_CARE_PROVIDER_SITE_OTHER): Admitting: Sleep Medicine

## 2023-10-28 ENCOUNTER — Encounter: Payer: Self-pay | Admitting: Sleep Medicine

## 2023-10-28 VITALS — BP 122/88 | HR 84 | Temp 98.9°F | Ht 63.0 in | Wt 230.8 lb

## 2023-10-28 DIAGNOSIS — Z6841 Body Mass Index (BMI) 40.0 and over, adult: Secondary | ICD-10-CM

## 2023-10-28 DIAGNOSIS — I10 Essential (primary) hypertension: Secondary | ICD-10-CM

## 2023-10-28 DIAGNOSIS — G4733 Obstructive sleep apnea (adult) (pediatric): Secondary | ICD-10-CM

## 2023-10-28 DIAGNOSIS — Z87891 Personal history of nicotine dependence: Secondary | ICD-10-CM

## 2023-10-28 NOTE — Progress Notes (Signed)
 Name:Monique Graham MRN: [id number] DOB: 09/10/1955   CHIEF COMPLAINT:  CPAP F/U   HISTORY OF PRESENT ILLNESS:  [name] is a 68 y.o. w/ a h/o OSA, HTN and morbid obesity who presents for CPAP F/U visit. Reports using CPAP therapy every night, which is confirmed by compliance data. She is currently using the Airfit N30i nasal congestion. Reports feeling more refreshed upon awakening with CPAP therapy.    EPWORTH SLEEP SCORE    08/26/2023    9:00 AM  Results of the Epworth flowsheet  Sitting and reading 2  Watching TV 0  Sitting, inactive in a public place (e.g. a theatre or a meeting) 1  As a passenger in a car for an hour without a break 0  Lying down to rest in the afternoon when circumstances permit 1  Sitting and talking to someone 0  Sitting quietly after a lunch without alcohol 1  In a car, while stopped for a few minutes in traffic 0  Total score 5    PAST MEDICAL HISTORY :   has a past medical history of Acute kidney failure (HCC), Atherosclerotic heart disease of native coronary artery without angina pectoris, Chronic kidney disease (CKD), Congestive heart failure (CHF) (HCC), Coronary atherosclerosis due to calcified coronary lesion, Cyst of left kidney, Diabetes mellitus without complication (HCC), Diverticulosis of large intestine without perforation or abscess without bleeding, Endometrial intraepithelial neoplasia (EIN), Enlarged heart, Folate deficiency anemia, Gastric ulcer, Hyperlipidemia, Hypertension, Migraine, Nonerosive esophageal reflux disease, Nonrheumatic mitral (valve) insufficiency, Obstructive sleep apnea, Osteonecrosis (HCC), Osteoporosis, Other specified disorders of bone density and structure, multiple sites, Ovarian cyst, Polycystic ovary syndrome, Premature ventricular contractions, Restless leg syndrome, Vitamin B12 deficiency, and Vitamin D deficiency.  has a past surgical history that includes Abdominal hysterectomy and  Oophorectomy. Prior to Admission medications   Medication Sig Start Date End Date Taking? Authorizing Provider  aspirin EC 81 MG tablet Take 81 mg by mouth daily.   Yes [provider]  azelastine  (ASTELIN ) 0.1 % nasal spray Place 2 sprays into both nostrils 2 (two) times daily. Use in each nostril as directed 05/23/23  Yes Hedges, Susana Enter, PA-C  cloNIDine  (CATAPRES ) 0.1 MG tablet Take 1 tablet (0.1 mg total) by mouth 2 (two) times daily. 10/04/23  Yes Melodie Spry, NP  desloratadine  (CLARINEX ) 5 MG tablet TAKE 1 TABLET (5 MG TOTAL) BY MOUTH DAILY. 09/25/23 09/24/24 Yes Milon Aloe B, MD  diclofenac  Sodium (VOLTAREN ) 1 % GEL Apply 2 g topically daily as needed. 03/12/23  Yes Melodie Spry, NP  Evolocumab  (REPATHA  SURECLICK) 140 MG/ML SOAJ Inject 140 mg into the skin every 14 (fourteen) days. 10/04/23  Yes Melodie Spry, NP  ezetimibe  (ZETIA ) 10 MG tablet Take 1 tablet (10 mg total) by mouth daily. 04/03/23 04/02/24 Yes Melodie Spry, NP  fluticasone  (FLONASE ) 50 MCG/ACT nasal spray Place 2 sprays into both nostrils daily. 08/12/23  Yes Hedges, Susana Enter, PA-C  Folic Acid (FOLATE PO) Take 666 mcg by mouth daily at 12 noon.   Yes [provider]  meclizine  (ANTIVERT ) 25 MG tablet Take 1 tablet (25 mg total) by mouth 3 (three) times daily as needed for dizziness. 05/23/23  Yes Hedges, Susana Enter, PA-C  metoprolol  tartrate (LOPRESSOR ) 50 MG tablet Take 1 tablet (50 mg total) by mouth 2 (two) times daily. 10/04/23  Yes Melodie Spry, NP  olmesartan  (BENICAR ) 40 MG tablet Take 1 tablet (40 mg total) by mouth daily. 10/04/23  Yes Melodie Spry, NP  OZEMPIC ,  1 MG/DOSE, 4 MG/3ML SOPN INJECT 1 MG INTO THE SKIN ONE TIME PER WEEK 09/05/23  Yes Melodie Spry, NP  allopurinol (ZYLOPRIM) 100 MG tablet Take 100 mg by mouth daily. Patient not taking: Reported on 10/28/2023 05/31/21   [provider]  Rimegepant Sulfate (NURTEC) 75 MG TBDP Take 1 tablet (75 mg total) by mouth every other day. Patient not  taking: Reported on 10/28/2023 04/03/23 10/28/23  Melodie Spry, NP   Allergies  Allergen Reactions   Oxycodone-Acetaminophen Anaphylaxis and Other (See Comments)   Latex Rash   Lactose Diarrhea   Sulfa Antibiotics Other (See Comments)    FAMILY HISTORY:  family history includes ALS in her cousin; Breast cancer in her cousin, mother, and sister; Diabetes in her father and mother; Heart disease in her mother; Hypertension in her mother. SOCIAL HISTORY:  reports that she quit smoking about 36 years ago. Her smoking use included cigarettes. She has never been exposed to tobacco smoke. She has never used smokeless tobacco. She reports that she does not drink alcohol and does not use drugs.   Review of Systems:  Gen:  Denies  fever, sweats, chills weight loss  HEENT: Denies blurred vision, double vision, ear pain, eye pain, hearing loss, nose bleeds, sore throat Cardiac:  No dizziness, chest pain or heaviness, chest tightness,edema, No JVD Resp:   No cough, -sputum production, -shortness of breath,-wheezing, -hemoptysis,  Gi: Denies swallowing difficulty, stomach pain, nausea or vomiting, diarrhea, constipation, bowel incontinence Gu:  Denies bladder incontinence, burning urine Ext:   Denies Joint pain, stiffness or swelling Skin: Denies  skin rash, easy bruising or bleeding or hives Endoc:  Denies polyuria, polydipsia , polyphagia or weight change Psych:   Denies depression, insomnia or hallucinations  Other:  All other systems negative  VITAL SIGNS: BP 122/88 (BP Location: Left Arm, Patient Position: Sitting, Cuff Size: Large)   Pulse 84   Temp 98.9 F (37.2 C) (Oral)   Ht 5' 3 (1.6 m)   Wt 230 lb 12.8 oz (104.7 kg)   LMP  (LMP Unknown)   SpO2 96%   BMI 40.88 kg/m    Physical Examination:   General Appearance: No distress  EYES PERRLA, EOM intact.   NECK Supple, No JVD Pulmonary: normal breath sounds, No wheezing.  CardiovascularNormal S1,S2.  No m/r/g.   Abdomen: Benign,  Soft, non-tender. Skin:   warm, no rashes, no ecchymosis  Extremities: normal, no cyanosis, clubbing. Neuro:without focal findings,  speech normal  PSYCHIATRIC: Mood, affect within normal limits.   ASSESSMENT AND PLAN  OSA I suspect that OSA is likely present due to clinical presentation. Discussed the consequences of untreated sleep apnea. Advised not to drive drowsy for safety of patient and others. Will complete further evaluation with a home sleep study and follow up to review results.    HTN Stable, on current management. Following with PCP.   Morbid obesity Counseled patient on diet and lifestyle modification.    Patient  satisfied with Plan of action and management. All questions answered  I spent a total of 27 minutes reviewing chart data, face-to-face evaluation with the patient, counseling and coordination of care as detailed above.    Jelicia Nantz, M.D.  Sleep Medicine [facility]

## 2023-10-28 NOTE — Patient Instructions (Signed)

## 2023-10-30 ENCOUNTER — Ambulatory Visit

## 2023-11-04 ENCOUNTER — Ambulatory Visit

## 2023-11-04 DIAGNOSIS — R42 Dizziness and giddiness: Secondary | ICD-10-CM

## 2023-11-04 DIAGNOSIS — M542 Cervicalgia: Secondary | ICD-10-CM

## 2023-11-04 DIAGNOSIS — M6281 Muscle weakness (generalized): Secondary | ICD-10-CM

## 2023-11-04 DIAGNOSIS — R2681 Unsteadiness on feet: Secondary | ICD-10-CM

## 2023-11-04 NOTE — Therapy (Signed)
 OUTPATIENT PHYSICAL THERAPY TREATMENT  Patient Name: Monique Graham MRN: [id number] DOB:[birth year], 68 y.o., female Today's Date: 11/04/2023  END OF SESSION:   PT End of Session - 11/04/23 0934     Visit Number 12    Number of Visits 25    Date for PT Re-Evaluation 11/19/23    Progress Note Due on Visit 20    PT Start Time 0931    PT Stop Time 1011    PT Time Calculation (min) 40 min    Equipment Utilized During Treatment Gait belt    Activity Tolerance Patient tolerated treatment well;No increased pain    Behavior During Therapy WFL for tasks assessed/performed               Past Medical History:  Diagnosis Date   Acute kidney failure (HCC)    Atherosclerotic heart disease of native coronary artery without angina pectoris    Chronic kidney disease (CKD)    Congestive heart failure (CHF) (HCC)    Coronary atherosclerosis due to calcified coronary lesion    Cyst of left kidney    Diabetes mellitus without complication (HCC)    Diverticulosis of large intestine without perforation or abscess without bleeding    Endometrial intraepithelial neoplasia (EIN)    Enlarged heart    Folate deficiency anemia    Gastric ulcer    Hyperlipidemia    Hypertension    Migraine    Nonerosive esophageal reflux disease    Nonrheumatic mitral (valve) insufficiency    Obstructive sleep apnea    Osteonecrosis (HCC)    Osteoporosis    Other specified disorders of bone density and structure, multiple sites    Ovarian cyst    Polycystic ovary syndrome    Premature ventricular contractions    Restless leg syndrome    Vitamin B12 deficiency    Vitamin D deficiency    Past Surgical History:  Procedure Laterality Date   ABDOMINAL HYSTERECTOMY     OOPHORECTOMY     Patient Active Problem List   Diagnosis Date Noted   Intermittent palpitations 10/08/2023   Benign hypertension with stage 3a chronic kidney disease (HCC) 10/08/2023   Benign hypertension with CKD (chronic  kidney disease) stage III (HCC) 10/04/2023   Class 3 severe obesity due to excess calories with serious comorbidity and body mass index (BMI) of 40.0 to 44.9 in adult 10/04/2023   Hair loss 06/20/2023   Sleep apnea 06/20/2023   Otalgia, left 04/10/2023   Vertigo 04/10/2023   Class 2 obesity due to excess calories with body mass index (BMI) of 38.0 to 38.9 in adult 04/10/2023   Non-seasonal allergic rhinitis 04/10/2023   Class 2 severe obesity due to excess calories with serious comorbidity and body mass index (BMI) of 39.0 to 39.9 in adult (HCC) 03/11/2023   Dyslipidemia due to type 2 diabetes mellitus (HCC) 03/11/2023   Type 2 diabetes mellitus with stage 3a chronic kidney disease, without long-term current use of insulin (HCC) 03/11/2023   Vitamin B12 deficiency 01/30/2023   Cystitis 01/30/2023   Acute right flank pain 01/30/2023   Establishing care with new doctor, encounter for 01/30/2023   Hyperlipidemia LDL goal <100 01/30/2023   Intractable chronic migraine without aura and without status migrainosus 01/30/2023   Hypertension 01/30/2023   Immunization due 01/30/2023    PCP: Petrina Pries, NP REFERRING PROVIDER: Palmer Purchase, PA-C  REFERRING DIAG: vertigo   THERAPY DIAG:  Dizziness and giddiness  Muscle weakness (generalized)  Unsteadiness on feet  Cervicalgia  ONSET DATE: May 2024  Rationale for Evaluation and Treatment: Rehabilitation  SUBJECTIVE:  SUBJECTIVE STATEMENT: Pt reports major vertigo episode Thursday while getting ready to leave home to come here for visit She took her last meclizine  and then laid down to rest. Pt has been low level dizzy last 2 days. Pt says last week she noticed 1 day where she was having dizzy symptoms related ot dehydration, so she drank more water as instructed and felt improved.   PERTINENT HISTORY:  The pt is a pleasant 68 y/o female presenting to PT for vertigo/dizziness and imbalance. Pt reports onset of dizziness started May  last year. She was driving and dizziness began out of nowhere. It lasted for 3 minutes at the time. She reports frequency of dizzy episodes has increased a bit more now since last year. She reports a headache and burning in her throat sensation that typically precedes vertigo. She always gets a headache before or during dizzy episodes.  Pt with hx of migraines. She takes ibuprofen or Tylenol to treat her symptoms. She used to have migraines frequently. She says now headaches seem more sinus-related. Headaches now are more mild and are felt in her sinuses or top of head. She does have ringing in her ears sometimes, sometimes occurs before headache, particularly in R ear, does have pain in L ear.  She reports she has been diagnosed with bilateral hearing loss. She reports her balance is not good and states it is like her equilibrium is off, affects ability to walk up steps, walk around corners. She reports no falls in last 6 months.  She says her legs can hurt at night; this occurred when she took a particular statin. She reports her sleep is not good, she has sleep apnea but has not been using CPAP until yesterday.  PT just moved back to the area and does not have a neurologist here currently. Other information: Pt can have dizziness watching TV, no dizziness looking at her phone, no dizziness in busy stores, no issues with symptoms as passenger in a car. She gets seasick, used to like go fishing, but had to stop.  Pt likes to dance and cook, and she likes to travel.  Treadmills make her dizzy. She goes to the gym, does everything except for treadmill/bikes.   PMH per chart significant for acute kidney failure, atherosclerotic heart disease of native coronary artery without angina pectoris, chronic kidney disease, congestive heart failure, DM, endometrial intraepithelial neoplasia, enlarged heart, folate deficiency anemia, HTN, OSA, osteonecrosis, osteoporosis, vitamin B12 deficiency, Vitamin D deficiency,  migraines please refer to chart for full PMH  PAIN:  Are you having pain? No  PRECAUTIONS: None and Fall  WEIGHT BEARING RESTRICTIONS: No  FALLS: Has patient fallen in last 6 months? No  LIVING ENVIRONMENT: Lives with: lives with their spouse Stairs: 2 to get into her home no handrails, 12 inside has bilateral handrails Has following equipment at home:  PLOF: Independent  PATIENT GOALS: Get rid of the vertigo   OBJECTIVE:  Note: Objective measures were completed at Evaluation unless otherwise noted.  DIAGNOSTIC FINDINGS:  CT HEAD 03/14/23:  FINDINGS: Brain: No evidence of acute infarction, hemorrhage, hydrocephalus, extra-axial collection or mass lesion/mass effect. Similar patchy white matter hypodensities are nonspecific but compatible with chronic microvascular ischemic disease.   Vascular: No hyperdense vessel.   Skull: No acute fracture.   Sinuses/Orbits: Clear sinuses.  No acute orbital findings.   Other: No mastoid effusions.   IMPRESSION: No evidence of  acute intracranial abnormality.    Electronically Signed   By: Gilmore GORMAN Molt M.D.   On: 03/14/2023 12:18  COGNITION: Overall cognitive status: Within functional limits for tasks assessed   SENSATION: Pt reports occ numbness/tingling in her legs -was present when taking statin   POSTURE:  Rounded shoulders, slight increase kyphosis/fwd head  Cervical ROM:   AROM screen - formal assessment deferred, assessing to determine if pt has sufficient range of motion for vestibular tests, not for normative values  Rotation bilat WFL for testing - no pain Flex - WFL for testing and pain free Ext - WFL for testing - notices headache (has at baseline) with ext more than flex  STRENGTH: 10/23/23 MMT UE/LE  -UE strength grossly 4+/5 bilat UE  -LE strength grossly 4+/5 bilat LE, greatest defictis proximal mm  BED MOBILITY:  Bed mobility can make pt dizzy (rolling, supine>sit)  TRANSFERS: Assistive  device utilized: None  Sit to stand: Complete Independence Stand to sit: Complete Independence Chair to chair: Complete Independence GAIT: Gait pattern: increased variability in BOS with head turns/scanning, scanning impairs gait mechanics with pt becoming slightly unsteady Distance walked: clinic distances Assistive device utilized: None Level of assistance: SBA and CGA  FUNCTIONAL TESTS:  Dynamic Gait Index: 21/24  PATIENT SURVEYS:  DHI deferred to future visit  VESTIBULAR ASSESSMENT: OCULOMOTOR EXAM:  Ocular Alignment: normal  Ocular ROM: No Limitations  Spontaneous Nystagmus: absent  Gaze-Induced Nystagmus: absent  Smooth Pursuits: intact - does make pt feel dizzy, described as lightheaded   Saccades: catch-up saccade (undershooting) with L gaze, corrective saccade (undershooting) looking up - some symptoms but dizziness not as bas as with pursuits   Convergence/Divergence:  sees double 2 from face, converges, some dizziness (described as light-headed)   VESTIBULAR - OCULAR REFLEX:   Slow VOR: Normal - makes pt a little dizzy  VOR Cancellation: Normal - makes pt a little dizzy   Head-Impulse Test: Positive to L with corrective saccade - makes pt dizzy but also movement helps headache; positive to R with undershooting and corrective saccade   POSITIONAL TESTING: deferred to future visit                                                                                                                           TREATMENT DATE: 11/04/23 -orthostatic BP assessment: -136/75mmHg  85bpm seated  -139/39mmHg 91bpm standing   -Cardio endurance/LE endurance training Nustep: Seat 7, arms 7: Lvl 2 x 7 minutes  *end vitals: HR 95, SpO2 100%  *2 minutes recovery: HR 89, SpO2 100%  -seated horizontal head turns gaze stabilization: 3x30sec  *intermittent eyes closed breaks, denies any symptoms or loss of visual focus  -seated 2 target saccade practice 2x30sec (3 sec gaze on each)    -self selected BOS eyes closed balance x30sec  -lateral side stepping x12 bilat eyes open -self selected BOS eyes closed balance x30sec  -lateral side stepping x12 bilat eyes open  -NBOS eyes closed x30sec -  alterante forward/backeward stepping 4x77ft each  -NBOS eyes closed x30sec -alterante forward/backeward stepping 4x16ft each  *a few stumbles with backward stepping   PATIENT EDUCATION: Education details: exercise technique, goal reassessment, symptom tracker Person educated: Patient Education method: Explanation, Demonstration, and Verbal cues, handout  Education comprehension: verbalized understanding, returned demonstration, and needs further education  HOME EXERCISE PROGRAM: Access Code: [email or web address] URL: [email or web address] Date: 10/03/2023 Prepared by: Connell Kiss  Exercises - Walking  - 1 x daily - 3 x weekly - 1 sets - 3-6 minutes hold - Recumbent Bike  - 1 x daily - 3 x weekly - 1 sets - 6 minutes hold - Seated Scapular Retraction  - 1 x daily - 7 x weekly - 2 sets - 10 reps - Seated Shoulder Shrugs  - 1 x daily - 7 x weekly - 2 sets - 10 reps - Seated Gaze Stabilization with Head Rotation  - 1 x daily - 7 x weekly - 2 sets - 30 seconds hold - Seated Gaze Stabilization with Head Nod  - 1 x daily - 7 x weekly - 2 sets - 30 seconds hold  GOALS: Goals reviewed with patient? Yes, initiated, to continue with further assessment  SHORT TERM GOALS: Target date: 12/16/2023   Patient will be independent in home exercise program to improve balance, dizziness and mobility for increased QOL and ease with ADLs. Baseline: initiated; 6/4: Pt doing HEP every day Goal status: MET  LONG TERM GOALS: Target date: 01/27/2024    Patient will reduce dizziness handicap inventory score to <50, for less dizziness with ADLs and increased safety with home and work tasks. .  Baseline: 09/09/2023: 28;  6/4: 48 Goal status: ONGOING  2.  Patient will report at least a 30%  reduction in dizzy symptoms with provoking movements since start of PT to indicate increased ease with ADLs and mobility. Baseline: horizontal, vertical head movements, bed mobility and other movements currently make pt dizzy; 10/16/22: symptoms unchanged/dizziness with these activities, but no vertigo episodes  Goal status: ONGOING  3.  Patient will increase dynamic gait index score to at least 23/24 as to demonstrate reduced fall risk and improved dynamic gait balance for better safety with community/home ambulation.  Baseline: 21/24 most difficulty with head turn activities; 10/16/22: 20/24 Goal status: ONGOING  ASSESSMENT:  CLINICAL IMPRESSION:  Continued to work on interventions for cardiorespiratory conditioning and habituation of the vestibular system. Pt has fluctuating symptoms in session, worst so with saccades training and vertical head turns. Pt is waiting for medication refill for meclizine . Pt FU with ENT next week will ask about the Right tinnitus again. Ms. Lewter will benefit from further skilled PT to improve dizziness, gait, balance and mobility in order to increase ease and safety with ADLs and QOL.  OBJECTIVE IMPAIRMENTS: Abnormal gait, decreased balance, difficulty walking, dizziness, impaired sensation, improper body mechanics, postural dysfunction, and pain.   ACTIVITY LIMITATIONS: bending, stairs, bed mobility, locomotion level, and activities in general can be impacted/impaired if pt having dizzy episode  PARTICIPATION LIMITATIONS: meal prep, cleaning, driving, shopping, community activity, yard work, and ADLs generally impaired it pt having dizzy episode  PERSONAL FACTORS: Age, Sex, Time since onset of injury/illness/exacerbation, and 3+ comorbidities: PMH per chart significant for acute kidney failure, atherosclerotic heart disease of native coronary artery without angina pectoris, chronic kidney disease, congestive heart failure, DM, endometrial intraepithelial  neoplasia, enlarged heart, folate deficiency anemia, HTN, OSA, osteonecrosis, osteoporosis, vitamin B12 deficiency, Vitamin D deficiency, migraines please refer to chart  for full PMH are also affecting patient's functional outcome.   REHAB POTENTIAL: Good  CLINICAL DECISION MAKING: Evolving/moderate complexity  EVALUATION COMPLEXITY: Moderate   PLAN:  PT FREQUENCY: 1-2x/week  PT DURATION: 12 weeks  PLANNED INTERVENTIONS: 97164- PT Re-evaluation, 97750- Physical Performance Testing, 97110-Therapeutic exercises, 97530- Therapeutic activity, 97112- Neuromuscular re-education, 97535- Self Care, [id number]- Manual therapy, [phone]- Gait training, [phone]- Orthotic Initial, [phone]- Orthotic/Prosthetic subsequent, [phone]- Canalith repositioning, [id number]- Electrical stimulation (unattended), [phone]- Electrical stimulation (manual), Patient/Family education, Balance training, Stair training, Dry Needling, Joint mobilization, Spinal mobilization, Vestibular training, DME instructions, Cryotherapy, and Moist heat  PLAN FOR NEXT SESSION:    - follow-up on recommendations for: - increased daily water intake - set-up with cardiologist for BP management and follow-up pt reports of calcifications in arteries of her neck - recheck orthostatic hypotension if symptomatic  - SBP dropped by 13 on 09/26/2023 - continue cardio conditioning followed by gaze stabilization  - reinforce education on working at 5/10 on Borg RPE scale - advance HEP as indicated - balance interventions - dynamic gait training with turning    9:39 AM, 11/04/23 Peggye JAYSON Linear, PT, DPT Physical Therapist - [facility]  Outpatient Physical Therapy- Main Campus [phone]

## 2023-11-06 ENCOUNTER — Ambulatory Visit

## 2023-11-11 ENCOUNTER — Ambulatory Visit (INDEPENDENT_AMBULATORY_CARE_PROVIDER_SITE_OTHER): Admitting: Physician Assistant

## 2023-11-11 ENCOUNTER — Encounter (INDEPENDENT_AMBULATORY_CARE_PROVIDER_SITE_OTHER): Payer: Self-pay | Admitting: Physician Assistant

## 2023-11-11 ENCOUNTER — Ambulatory Visit

## 2023-11-11 VITALS — BP 127/88 | HR 84

## 2023-11-11 DIAGNOSIS — J302 Other seasonal allergic rhinitis: Secondary | ICD-10-CM | POA: Diagnosis not present

## 2023-11-11 DIAGNOSIS — R0982 Postnasal drip: Secondary | ICD-10-CM | POA: Diagnosis not present

## 2023-11-11 DIAGNOSIS — R0681 Apnea, not elsewhere classified: Secondary | ICD-10-CM | POA: Diagnosis not present

## 2023-11-11 DIAGNOSIS — G43809 Other migraine, not intractable, without status migrainosus: Secondary | ICD-10-CM

## 2023-11-11 DIAGNOSIS — R42 Dizziness and giddiness: Secondary | ICD-10-CM

## 2023-11-11 DIAGNOSIS — G4733 Obstructive sleep apnea (adult) (pediatric): Secondary | ICD-10-CM

## 2023-11-11 NOTE — Progress Notes (Signed)
 Dear Dr. Petrina, Here is my assessment for our mutual patient, Monique Graham. Thank you for allowing me the opportunity to care for your patient. Please do not hesitate to contact me should you have any other questions. Sincerely, Chyrl Cohen PA-C  Otolaryngology Clinic Note Referring provider: Dr. Petrina HPI:  Monique Graham is a 68 y.o. female kindly referred by Dr. Petrina   The patient is a 68 year old female seen in our office for follow-up evaluation of vertigo. She was last seen in the office on 08/12/2023. Below is a recap of that encounter.   The patient presents today for evaluation of dizziness.  The patient notes that starting in May (8 months ago) she experienced episodes of dizziness.  She notes the initial episode started while she was driving a car, she felt like the world was spinning.  She felt nauseous at the same time.  She was in [place]  at that time she went to an urgent care and then subsequently in the ER.  She notes she had a CT of her head which was normal.  The dizziness was not persistent so she was discharged home with meclizine .  She notes that the symptoms continue to recur.  She describes them as a room spinning some nausea and a frontal headache.  She notes they last minutes to no more than an hour and completely resolved in between episodes.  The symptoms are worse with movement, they can be provoked by sitting still as well.  She notes that when he she is having them they are improved with rest.  She denies any associated chest pain, shortness of breath.  She did have palpitations 1 time with them but has not had any since.  She denies any associated neurologic deficits with them, she denies any ringing in the ears.  She notes she occasionally gets a left-sided otitis externa requiring otic drops, this is approximately 2 times per year.  She notes a history of migraines but notes this feels different than her migraines and is not as severe.  She usually does  have a headache when she has the symptoms.  When she was started on meclizine  she notes the symptoms improved.  She also was seen in the emergency room at [facility] where again she had a CT head that was reported normal.  In addition to the vertiginous symptoms she also describes a history of chronic postnasal drainage.  She notes she has had this since she was a kid and thus thought this was normal.  As an adult she realized that is not normal to have constant drainage down the back of her throat.  She notes this does sometimes irritate the back of her throat and has affected her quality of singing.  She also notes that more recently she has had intermittent rhinorrhea.  She was diagnosed with allergies after being tested and was told that she is allergic to dust mites.  She has been using Flonase  with no significant improvement in her symptoms.  She was prescribed Xyzal  but given the cost she never started it.     Update 08/12/2023-   Since her last office visit she notes that she is still having episodes of vertigo.  She notes they are intermittent and can be present when moving or at rest.  She notes that they are brief and only last for minutes at a time.  She notes complete recovery in between episodes.  She denies any associated neurologic symptoms.  She notes she  continues to have ringing in your ears that is not persistent and not rhythmic.  She notes that the episodes of dizziness are approximately once or twice per week maximum.  She notes ongoing postnasal drip, she has been using the azelastine  but has not been using Flonase .  He continues to use her desloratadine .   In addition to the above previous noted complaints she does note she has a history of sleep apnea.  She notes that she had a sleep study that was done in [place]  approximately 1 year ago but has not established care with a sleep specialist here in [place].  She notes that she wakes up with headaches, dry mouth and is  unable to tolerate her sleep apnea mask.    Update 11/11/2023  Since her last office visit she notes that she has been doing well.  She continues to have intermittent episodes of vertigo. She reports that in addition to the vertigo she does have associate migraines that she has been dealing with for approx 8-10 years. She was seeing neurology in [place] for this and was on Topomax. She feels like the vertigo has improved, episodes last several minutes and generally resolved with with limiting head movements. She feels that vestibular rehab does help. She denies any changes to her symptoms. She also has followed up with neurology for her sleep apnea. She is wearing both nasal canula and mask. She tolerates it through the night. She is satisfied with this therapy. Chart review shows they will be doing at home sleep study.   Independent Review of Additional Tests or Records:  Neurology office visit on 10/28/2023     PMH/Meds/All/SocHx/FamHx/ROS:   Past Medical History:  Diagnosis Date   Acute kidney failure (HCC)    Atherosclerotic heart disease of native coronary artery without angina pectoris    Chronic kidney disease (CKD)    Congestive heart failure (CHF) (HCC)    Coronary atherosclerosis due to calcified coronary lesion    Cyst of left kidney    Diabetes mellitus without complication (HCC)    Diverticulosis of large intestine without perforation or abscess without bleeding    Endometrial intraepithelial neoplasia (EIN)    Enlarged heart    Folate deficiency anemia    Gastric ulcer    Hyperlipidemia    Hypertension    Migraine    Nonerosive esophageal reflux disease    Nonrheumatic mitral (valve) insufficiency    Obstructive sleep apnea    Osteonecrosis (HCC)    Osteoporosis    Other specified disorders of bone density and structure, multiple sites    Ovarian cyst    Polycystic ovary syndrome    Premature ventricular contractions    Restless leg syndrome    Vitamin B12 deficiency     Vitamin D deficiency      Past Surgical History:  Procedure Laterality Date   ABDOMINAL HYSTERECTOMY     OOPHORECTOMY      Family History  Problem Relation Age of Onset   Hypertension Mother    Diabetes Mother    Heart disease Mother    Breast cancer Mother    Diabetes Father    Breast cancer Sister    Breast cancer Cousin    ALS Cousin      Social Connections: Unknown (04/02/2023)   Social Connection and Isolation Panel    Frequency of Communication with Friends and Family: Twice a week    Frequency of Social Gatherings with Friends and Family: Patient declined    Attends  Religious Services: More than 4 times per year    Active Member of Clubs or Organizations: Yes    Attends Banker Meetings: Patient declined    Marital Status: Separated  Recent Concern: Social Connections - Moderately Isolated (03/01/2023)   Social Connection and Isolation Panel    Frequency of Communication with Friends and Family: More than three times a week    Frequency of Social Gatherings with Friends and Family: Patient unable to answer    Attends Religious Services: 1 to 4 times per year    Active Member of [employer] or Organizations: No    Attends Banker Meetings: Never    Marital Status: Separated      Current Outpatient Medications:    allopurinol (ZYLOPRIM) 100 MG tablet, Take 100 mg by mouth daily. (Patient not taking: Reported on 10/28/2023), Disp: , Rfl:    aspirin EC 81 MG tablet, Take 81 mg by mouth daily., Disp: , Rfl:    azelastine  (ASTELIN ) 0.1 % nasal spray, Place 2 sprays into both nostrils 2 (two) times daily. Use in each nostril as directed, Disp: 30 mL, Rfl: 12   cloNIDine  (CATAPRES ) 0.1 MG tablet, Take 1 tablet (0.1 mg total) by mouth 2 (two) times daily., Disp: 180 tablet, Rfl: 1   desloratadine  (CLARINEX ) 5 MG tablet, TAKE 1 TABLET (5 MG TOTAL) BY MOUTH DAILY., Disp: 90 tablet, Rfl: 3   diclofenac  Sodium (VOLTAREN ) 1 % GEL, Apply 2 g topically  daily as needed., Disp: 100 g, Rfl: 1   Evolocumab  (REPATHA  SURECLICK) 140 MG/ML SOAJ, Inject 140 mg into the skin every 14 (fourteen) days., Disp: 6 mL, Rfl: 1   ezetimibe  (ZETIA ) 10 MG tablet, Take 1 tablet (10 mg total) by mouth daily., Disp: 30 tablet, Rfl: 11   fluticasone  (FLONASE ) 50 MCG/ACT nasal spray, Place 2 sprays into both nostrils daily., Disp: 16 g, Rfl: 6   Folic Acid (FOLATE PO), Take 666 mcg by mouth daily at 12 noon., Disp: , Rfl:    meclizine  (ANTIVERT ) 25 MG tablet, Take 1 tablet (25 mg total) by mouth 3 (three) times daily as needed for dizziness., Disp: 30 tablet, Rfl: 1   metoprolol  tartrate (LOPRESSOR ) 50 MG tablet, Take 1 tablet (50 mg total) by mouth 2 (two) times daily., Disp: 180 tablet, Rfl: 1   olmesartan  (BENICAR ) 40 MG tablet, Take 1 tablet (40 mg total) by mouth daily., Disp: 90 tablet, Rfl: 2   OZEMPIC , 1 MG/DOSE, 4 MG/3ML SOPN, INJECT 1 MG INTO THE SKIN ONE TIME PER WEEK, Disp: 3 mL, Rfl: 2   Rimegepant Sulfate (NURTEC) 75 MG TBDP, Take 1 tablet (75 mg total) by mouth every other day. (Patient not taking: Reported on 10/28/2023), Disp: 16 tablet, Rfl: 2   Physical Exam:   LMP  (LMP Unknown)   Pertinent Findings  CN II-XII intact  Bilateral EAC clear and TM intact with well pneumatized middle ear spaces Anterior rhinoscopy: Septum midline; bilateral inferior turbinates with significant hypertrophy and edema, right greater than left No lesions of oral cavity/oropharynx; dentition within normal limits, surgically absent tonsils No obviously palpable neck masses/lymphadenopathy/thyromegaly No respiratory distress or stridor  Seprately Identifiable Procedures:  None  Impression & Plans:  Monique Graham is a 68 y.o. female with the following   Vertigo-  She continues to have episodes of vertigo. No red flags. Her symptoms are consistent with BBPV but also does have some associated headache/ migraine. This could be contributing. She will follow up with  neurology to discuss management of migraines.    Postnasal drainage and seasonal allergies-   Continue Flonase  and Desloratidine, symptoms seem to be controlled  Sleep apnea-  Tolerating CPAP, continue with sleep medicine     - f/u PRN   Thank you for allowing me the opportunity to care for your patient. Please do not hesitate to contact me should you have any other questions.  Sincerely, Chyrl Cohen PA-C [facility] Phone: [phone] Fax: [phone]  11/11/2023, 11:13 AM

## 2023-11-12 ENCOUNTER — Encounter: Payer: Self-pay | Admitting: Obstetrics and Gynecology

## 2023-11-12 ENCOUNTER — Ambulatory Visit: Attending: Physician Assistant

## 2023-11-12 ENCOUNTER — Ambulatory Visit: Admitting: Obstetrics and Gynecology

## 2023-11-12 VITALS — BP 132/86 | HR 84 | Ht 63.0 in | Wt 235.0 lb

## 2023-11-12 DIAGNOSIS — M6281 Muscle weakness (generalized): Secondary | ICD-10-CM | POA: Diagnosis present

## 2023-11-12 DIAGNOSIS — N952 Postmenopausal atrophic vaginitis: Secondary | ICD-10-CM

## 2023-11-12 DIAGNOSIS — R2681 Unsteadiness on feet: Secondary | ICD-10-CM | POA: Insufficient documentation

## 2023-11-12 DIAGNOSIS — Z1331 Encounter for screening for depression: Secondary | ICD-10-CM

## 2023-11-12 DIAGNOSIS — R262 Difficulty in walking, not elsewhere classified: Secondary | ICD-10-CM | POA: Insufficient documentation

## 2023-11-12 DIAGNOSIS — M542 Cervicalgia: Secondary | ICD-10-CM | POA: Diagnosis present

## 2023-11-12 DIAGNOSIS — R42 Dizziness and giddiness: Secondary | ICD-10-CM | POA: Diagnosis present

## 2023-11-12 DIAGNOSIS — Z803 Family history of malignant neoplasm of breast: Secondary | ICD-10-CM

## 2023-11-12 MED ORDER — ESTRADIOL 0.1 MG/GM VA CREA: TOPICAL_CREAM | VAGINAL | 3 refills | Status: AC

## 2023-11-12 NOTE — Progress Notes (Signed)
 68 y.o. New GYN presents for Vaginal dryness X 14 years.  Family HO Breast Cancer (Mother & Sister).  HO Hysterectomy, PCOS.  Last Mammogram 03/06/2023

## 2023-11-12 NOTE — Progress Notes (Signed)
  CC: vaginal atrophy Subjective:    Patient ID: Monique Graham, female    DOB: [birth year], 68 y.o.   MRN: [id number]  HPI 68 yo [id number] seen for discussion of vaginal atrophy.  Pt has had some degree of vaginal atrophy for about 14 years.  She has used estrogen cream in the past which was partially effective, but she stated it made the vagina burn sometimes.  She stopped usage out of breast cancer concern 2/2 to 2-3 family members with breast cancer.  Pt has never had any genetic testing and is not aware of breast cancer mutations in family members.  Pt is s/p hysterectomy with bilateral oophorectomy.   Review of Systems     Objective:   Physical Exam Vitals:   11/12/23 1354 11/12/23 1406  BP: (!) 149/92 132/86  Pulse: 85 84   SVE:  vaginal atrophy visually confirmed, no lichenoid lesions noted      Assessment & Plan:   1. Vaginal atrophy (Primary) Trial of vaginal estrogen with follow up in 4 months.  - estradiol (ESTRACE VAGINAL) 0.1 MG/GM vaginal cream; 1 applicatorful (1 gram) per vagina every evening for 2 weeks then once or twice weekly for maintenance  Dispense: 42.5 g; Refill: 3  2. Family history of breast cancer [employer] genetic testing ordered.  If mutation noted, will get genetic counseling and may need to hold topical estrogen at that time.    Jerilynn DELENA Buddle, MD Faculty Attending, [facility]

## 2023-11-12 NOTE — Therapy (Signed)
 OUTPATIENT PHYSICAL THERAPY TREATMENT  Patient Name: Monique Graham MRN: [id number] DOB:11/15/1955, 68 y.o., female Today's Date: 11/12/2023  END OF SESSION:   PT End of Session - 11/12/23 0934     Visit Number 13    Number of Visits 25    Date for PT Re-Evaluation 11/19/23    Progress Note Due on Visit 20    PT Start Time 0855    PT Stop Time 0928    PT Time Calculation (min) 33 min    Equipment Utilized During Treatment Gait belt    Activity Tolerance Patient tolerated treatment well;No increased pain    Behavior During Therapy WFL for tasks assessed/performed                Past Medical History:  Diagnosis Date   Acute kidney failure (HCC)    Atherosclerotic heart disease of native coronary artery without angina pectoris    Chronic kidney disease (CKD)    Congestive heart failure (CHF) (HCC)    Coronary atherosclerosis due to calcified coronary lesion    Cyst of left kidney    Diabetes mellitus without complication (HCC)    Diverticulosis of large intestine without perforation or abscess without bleeding    Endometrial intraepithelial neoplasia (EIN)    Enlarged heart    Folate deficiency anemia    Gastric ulcer    Hyperlipidemia    Hypertension    Migraine    Nonerosive esophageal reflux disease    Nonrheumatic mitral (valve) insufficiency    Obstructive sleep apnea    Osteonecrosis (HCC)    Osteoporosis    Other specified disorders of bone density and structure, multiple sites    Ovarian cyst    Polycystic ovary syndrome    Premature ventricular contractions    Restless leg syndrome    Vitamin B12 deficiency    Vitamin D deficiency    Past Surgical History:  Procedure Laterality Date   ABDOMINAL HYSTERECTOMY     OOPHORECTOMY     Patient Active Problem List   Diagnosis Date Noted   Intermittent palpitations 10/08/2023   Benign hypertension with stage 3a chronic kidney disease (HCC) 10/08/2023   Benign hypertension with CKD (chronic  kidney disease) stage III (HCC) 10/04/2023   Class 3 severe obesity due to excess calories with serious comorbidity and body mass index (BMI) of 40.0 to 44.9 in adult 10/04/2023   Hair loss 06/20/2023   Sleep apnea 06/20/2023   Otalgia, left 04/10/2023   Vertigo 04/10/2023   Class 2 obesity due to excess calories with body mass index (BMI) of 38.0 to 38.9 in adult 04/10/2023   Non-seasonal allergic rhinitis 04/10/2023   Class 2 severe obesity due to excess calories with serious comorbidity and body mass index (BMI) of 39.0 to 39.9 in adult (HCC) 03/11/2023   Dyslipidemia due to type 2 diabetes mellitus (HCC) 03/11/2023   Type 2 diabetes mellitus with stage 3a chronic kidney disease, without long-term current use of insulin (HCC) 03/11/2023   Vitamin B12 deficiency 01/30/2023   Cystitis 01/30/2023   Acute right flank pain 01/30/2023   Establishing care with new doctor, encounter for 01/30/2023   Hyperlipidemia LDL goal <100 01/30/2023   Intractable chronic migraine without aura and without status migrainosus 01/30/2023   Hypertension 01/30/2023   Immunization due 01/30/2023    PCP: Petrina Pries, NP REFERRING PROVIDER: Palmer Purchase, PA-C  REFERRING DIAG: vertigo   THERAPY DIAG:  Dizziness and giddiness  Muscle weakness (generalized)  Unsteadiness on feet  ONSET DATE: May 2024  Rationale for Evaluation and Treatment: Rehabilitation  SUBJECTIVE:  SUBJECTIVE STATEMENT: Pt reports dizziness episode on Friday. Saw her ENT and has been referred to a neurologist. She reports continued headaches and burning in her throat with episodes. Pt reports one stumble getting on the elevator.   PERTINENT HISTORY:  The pt is a pleasant 68 y/o female presenting to PT for vertigo/dizziness and imbalance. Pt reports onset of dizziness started May last year. She was driving and dizziness began out of nowhere. It lasted for 3 minutes at the time. She reports frequency of dizzy episodes has  increased a bit more now since last year. She reports a headache and burning in her throat sensation that typically precedes vertigo. She always gets a headache before or during dizzy episodes.  Pt with hx of migraines. She takes ibuprofen or Tylenol to treat her symptoms. She used to have migraines frequently. She says now headaches seem more sinus-related. Headaches now are more mild and are felt in her sinuses or top of head. She does have ringing in her ears sometimes, sometimes occurs before headache, particularly in R ear, does have pain in L ear.  She reports she has been diagnosed with bilateral hearing loss. She reports her balance is not good and states it is like her equilibrium is off, affects ability to walk up steps, walk around corners. She reports no falls in last 6 months.  She says her legs can hurt at night; this occurred when she took a particular statin. She reports her sleep is not good, she has sleep apnea but has not been using CPAP until yesterday.  PT just moved back to the area and does not have a neurologist here currently. Other information: Pt can have dizziness watching TV, no dizziness looking at her phone, no dizziness in busy stores, no issues with symptoms as passenger in a car. She gets seasick, used to like go fishing, but had to stop.  Pt likes to dance and cook, and she likes to travel.  Treadmills make her dizzy. She goes to the gym, does everything except for treadmill/bikes.   PMH per chart significant for acute kidney failure, atherosclerotic heart disease of native coronary artery without angina pectoris, chronic kidney disease, congestive heart failure, DM, endometrial intraepithelial neoplasia, enlarged heart, folate deficiency anemia, HTN, OSA, osteonecrosis, osteoporosis, vitamin B12 deficiency, Vitamin D deficiency, migraines please refer to chart for full PMH  PAIN:  Are you having pain? No  PRECAUTIONS: None and Fall  WEIGHT BEARING RESTRICTIONS:  No  FALLS: Has patient fallen in last 6 months? No  LIVING ENVIRONMENT: Lives with: lives with their spouse Stairs: 2 to get into her home no handrails, 12 inside has bilateral handrails Has following equipment at home:  PLOF: Independent  PATIENT GOALS: Get rid of the vertigo   OBJECTIVE:  Note: Objective measures were completed at Evaluation unless otherwise noted.  DIAGNOSTIC FINDINGS:  CT HEAD 03/14/23:  FINDINGS: Brain: No evidence of acute infarction, hemorrhage, hydrocephalus, extra-axial collection or mass lesion/mass effect. Similar patchy white matter hypodensities are nonspecific but compatible with chronic microvascular ischemic disease.   Vascular: No hyperdense vessel.   Skull: No acute fracture.   Sinuses/Orbits: Clear sinuses.  No acute orbital findings.   Other: No mastoid effusions.   IMPRESSION: No evidence of acute intracranial abnormality.    Electronically Signed   By: Gilmore GORMAN Molt M.D.   On: 03/14/2023 12:18  COGNITION: Overall cognitive status: Within functional limits for  tasks assessed   SENSATION: Pt reports occ numbness/tingling in her legs -was present when taking statin   POSTURE:  Rounded shoulders, slight increase kyphosis/fwd head  Cervical ROM:   AROM screen - formal assessment deferred, assessing to determine if pt has sufficient range of motion for vestibular tests, not for normative values  Rotation bilat WFL for testing - no pain Flex - WFL for testing and pain free Ext - WFL for testing - notices headache (has at baseline) with ext more than flex  STRENGTH: 10/23/23 MMT UE/LE  -UE strength grossly 4+/5 bilat UE  -LE strength grossly 4+/5 bilat LE, greatest defictis proximal mm  BED MOBILITY:  Bed mobility can make pt dizzy (rolling, supine>sit)  TRANSFERS: Assistive device utilized: None  Sit to stand: Complete Independence Stand to sit: Complete Independence Chair to chair: Complete  Independence GAIT: Gait pattern: increased variability in BOS with head turns/scanning, scanning impairs gait mechanics with pt becoming slightly unsteady Distance walked: clinic distances Assistive device utilized: None Level of assistance: SBA and CGA  FUNCTIONAL TESTS:  Dynamic Gait Index: 21/24  PATIENT SURVEYS:  DHI deferred to future visit  VESTIBULAR ASSESSMENT: OCULOMOTOR EXAM:  Ocular Alignment: normal  Ocular ROM: No Limitations  Spontaneous Nystagmus: absent  Gaze-Induced Nystagmus: absent  Smooth Pursuits: intact - does make pt feel dizzy, described as lightheaded   Saccades: catch-up saccade (undershooting) with L gaze, corrective saccade (undershooting) looking up - some symptoms but dizziness not as bas as with pursuits   Convergence/Divergence:  sees double 2 from face, converges, some dizziness (described as light-headed)   VESTIBULAR - OCULAR REFLEX:   Slow VOR: Normal - makes pt a little dizzy  VOR Cancellation: Normal - makes pt a little dizzy   Head-Impulse Test: Positive to L with corrective saccade - makes pt dizzy but also movement helps headache; positive to R with undershooting and corrective saccade   POSITIONAL TESTING: deferred to future visit                                                                                                                           TREATMENT DATE: 11/12/23   TA: to promote conditioning, symptom modulation, activity tolerance  STS 3x10 - rates easy  Standing hip abduction 2x12 each LE, cuing for technique   Cardio endurance/LE endurance training emphasis  Lvl 1 x 1 min  Lvl 2 x 1 min Lvl 3 x 4 min  SPM 60s-100s   Standing rows at cable machine 2.5# 10x BUE 7.25# 2x10 BUE   Paloff press  7.5# 18x and 5.5# 15x  Lunge walks x multiple reps length of bar with use of BUE support due to unsteadiness  Standing hip ext 2x10 each LE Standing march 2x10 each LE     PATIENT EDUCATION: Education details:  exercise technique Person educated: Patient Education method: Explanation, Demonstration, and Verbal cues,  Education comprehension: verbalized understanding, returned demonstration, and needs further education  HOME EXERCISE PROGRAM: Access  Code: [email or web address] URL: [email or web address] Date: 10/03/2023 Prepared by: Connell Kiss  Exercises - Walking  - 1 x daily - 3 x weekly - 1 sets - 3-6 minutes hold - Recumbent Bike  - 1 x daily - 3 x weekly - 1 sets - 6 minutes hold - Seated Scapular Retraction  - 1 x daily - 7 x weekly - 2 sets - 10 reps - Seated Shoulder Shrugs  - 1 x daily - 7 x weekly - 2 sets - 10 reps - Seated Gaze Stabilization with Head Rotation  - 1 x daily - 7 x weekly - 2 sets - 30 seconds hold - Seated Gaze Stabilization with Head Nod  - 1 x daily - 7 x weekly - 2 sets - 30 seconds hold  GOALS: Goals reviewed with patient? Yes, initiated, to continue with further assessment  SHORT TERM GOALS: Target date: 12/24/2023   Patient will be independent in home exercise program to improve balance, dizziness and mobility for increased QOL and ease with ADLs. Baseline: initiated; 6/4: Pt doing HEP every day Goal status: MET  LONG TERM GOALS: Target date: 02/04/2024    Patient will reduce dizziness handicap inventory score to <50, for less dizziness with ADLs and increased safety with home and work tasks. .  Baseline: 09/09/2023: 28;  6/4: 48 Goal status: ONGOING  2.  Patient will report at least a 30% reduction in dizzy symptoms with provoking movements since start of PT to indicate increased ease with ADLs and mobility. Baseline: horizontal, vertical head movements, bed mobility and other movements currently make pt dizzy; 10/16/22: symptoms unchanged/dizziness with these activities, but no vertigo episodes  Goal status: ONGOING  3.  Patient will increase dynamic gait index score to at least 23/24 as to demonstrate reduced fall risk and improved dynamic gait balance  for better safety with community/home ambulation.  Baseline: 21/24 most difficulty with head turn activities; 10/16/22: 20/24 Goal status: ONGOING  ASSESSMENT:  CLINICAL IMPRESSION:  Pt able to progress cardio conditioning intervention on nustep this visit without significant fatigue and without reports of dizziness, indicating improved activity tolerance. In addition to LE strengthening, PT also introduced UE and core conditioning.. Ms. Latella will benefit from further skilled PT to improve dizziness, gait, balance and mobility in order to increase ease and safety with ADLs and QOL.  OBJECTIVE IMPAIRMENTS: Abnormal gait, decreased balance, difficulty walking, dizziness, impaired sensation, improper body mechanics, postural dysfunction, and pain.   ACTIVITY LIMITATIONS: bending, stairs, bed mobility, locomotion level, and activities in general can be impacted/impaired if pt having dizzy episode  PARTICIPATION LIMITATIONS: meal prep, cleaning, driving, shopping, community activity, yard work, and ADLs generally impaired it pt having dizzy episode  PERSONAL FACTORS: Age, Sex, Time since onset of injury/illness/exacerbation, and 3+ comorbidities: PMH per chart significant for acute kidney failure, atherosclerotic heart disease of native coronary artery without angina pectoris, chronic kidney disease, congestive heart failure, DM, endometrial intraepithelial neoplasia, enlarged heart, folate deficiency anemia, HTN, OSA, osteonecrosis, osteoporosis, vitamin B12 deficiency, Vitamin D deficiency, migraines please refer to chart for full PMH are also affecting patient's functional outcome.   REHAB POTENTIAL: Good  CLINICAL DECISION MAKING: Evolving/moderate complexity  EVALUATION COMPLEXITY: Moderate   PLAN:  PT FREQUENCY: 1-2x/week  PT DURATION: 12 weeks  PLANNED INTERVENTIONS: 97164- PT Re-evaluation, 97750- Physical Performance Testing, 97110-Therapeutic exercises, 97530- Therapeutic  activity, [id number]- Neuromuscular re-education, 97535- Self Care, [id number]- Manual therapy, [id number]- Gait training, [id number]- Orthotic Initial, [id number]- Orthotic/Prosthetic subsequent, [phone]- Canalith repositioning, [id number]- Electrical stimulation (unattended),  [id number]- Electrical stimulation (manual), Patient/Family education, Balance training, Stair training, Dry Needling, Joint mobilization, Spinal mobilization, Vestibular training, DME instructions, Cryotherapy, and Moist heat  PLAN FOR NEXT SESSION:    - follow-up on recommendations for: - increased daily water intake - set-up with cardiologist for BP management and follow-up pt reports of calcifications in arteries of her neck - recheck orthostatic hypotension if symptomatic  - SBP dropped by 13 on 09/26/2023 - continue cardio conditioning followed by gaze stabilization  - reinforce education on working at 5/10 on Borg RPE scale - advance HEP as indicated - balance interventions - dynamic gait training with turning    9:38 AM, 11/12/23 Darryle Patten PT, DPT  Physical Therapist - [facility]  Outpatient Physical Therapy- Main [facility] [phone]

## 2023-11-14 ENCOUNTER — Ambulatory Visit: Admitting: Physical Therapy

## 2023-11-14 DIAGNOSIS — R42 Dizziness and giddiness: Secondary | ICD-10-CM

## 2023-11-14 DIAGNOSIS — M542 Cervicalgia: Secondary | ICD-10-CM

## 2023-11-14 DIAGNOSIS — R2681 Unsteadiness on feet: Secondary | ICD-10-CM

## 2023-11-14 DIAGNOSIS — M6281 Muscle weakness (generalized): Secondary | ICD-10-CM

## 2023-11-14 NOTE — Therapy (Signed)
 OUTPATIENT PHYSICAL THERAPY TREATMENT  Patient Name: Monique Graham MRN: [id number] DOB:July 21, 1955, 68 y.o., female Today's Date: 11/14/2023  END OF SESSION:   PT End of Session - 11/14/23 1028     Visit Number 14    Number of Visits 25    Date for PT Re-Evaluation 11/19/23    Progress Note Due on Visit 20    PT Start Time 1025    PT Stop Time 1101    PT Time Calculation (min) 36 min    Equipment Utilized During Treatment Gait belt    Activity Tolerance Patient tolerated treatment well;No increased pain    Behavior During Therapy WFL for tasks assessed/performed          Past Medical History:  Diagnosis Date   Acute kidney failure (HCC)    Atherosclerotic heart disease of native coronary artery without angina pectoris    Chronic kidney disease (CKD)    Congestive heart failure (CHF) (HCC)    Coronary atherosclerosis due to calcified coronary lesion    Cyst of left kidney    Diabetes mellitus without complication (HCC)    Diverticulosis of large intestine without perforation or abscess without bleeding    Endometrial intraepithelial neoplasia (EIN)    Enlarged heart    Folate deficiency anemia    Gastric ulcer    Hyperlipidemia    Hypertension    Migraine    Nonerosive esophageal reflux disease    Nonrheumatic mitral (valve) insufficiency    Obstructive sleep apnea    Osteonecrosis (HCC)    Osteoporosis    Other specified disorders of bone density and structure, multiple sites    Ovarian cyst    Polycystic ovary syndrome    Premature ventricular contractions    Restless leg syndrome    Vaginal Pap smear, abnormal    Vitamin B12 deficiency    Vitamin D deficiency    Past Surgical History:  Procedure Laterality Date   ABDOMINAL HYSTERECTOMY     OOPHORECTOMY     Patient Active Problem List   Diagnosis Date Noted   Intermittent palpitations 10/08/2023   Benign hypertension with stage 3a chronic kidney disease (HCC) 10/08/2023   Benign hypertension  with CKD (chronic kidney disease) stage III (HCC) 10/04/2023   Class 3 severe obesity due to excess calories with serious comorbidity and body mass index (BMI) of 40.0 to 44.9 in adult 10/04/2023   Hair loss 06/20/2023   Sleep apnea 06/20/2023   Otalgia, left 04/10/2023   Vertigo 04/10/2023   Class 2 obesity due to excess calories with body mass index (BMI) of 38.0 to 38.9 in adult 04/10/2023   Non-seasonal allergic rhinitis 04/10/2023   Class 2 severe obesity due to excess calories with serious comorbidity and body mass index (BMI) of 39.0 to 39.9 in adult (HCC) 03/11/2023   Dyslipidemia due to type 2 diabetes mellitus (HCC) 03/11/2023   Type 2 diabetes mellitus with stage 3a chronic kidney disease, without long-term current use of insulin (HCC) 03/11/2023   Vitamin B12 deficiency 01/30/2023   Cystitis 01/30/2023   Acute right flank pain 01/30/2023   Establishing care with new doctor, encounter for 01/30/2023   Hyperlipidemia LDL goal <100 01/30/2023   Intractable chronic migraine without aura and without status migrainosus 01/30/2023   Hypertension 01/30/2023   Immunization due 01/30/2023    PCP: Petrina Pries, NP REFERRING PROVIDER: Palmer Purchase, PA-C  REFERRING DIAG: vertigo   THERAPY DIAG:  Dizziness and giddiness  Muscle weakness (generalized)  Unsteadiness on feet  Cervicalgia  ONSET DATE: May 2024  Rationale for Evaluation and Treatment: Rehabilitation  SUBJECTIVE:  SUBJECTIVE STATEMENT:   Pt apologizes for her late arrival to therapy session. States she had an attack last Friday, but other than that she hasn't had a full episode of vertigo recently. Reports recently she has had more instability/stumbling with light dizziness.  Pt states she has appointment with Neurologist on August 12th.    PERTINENT HISTORY:  The pt is a pleasant 68 y/o female presenting to PT for vertigo/dizziness and imbalance. Pt reports onset of dizziness started May last year. She  was driving and dizziness began out of nowhere. It lasted for 3 minutes at the time. She reports frequency of dizzy episodes has increased a bit more now since last year. She reports a headache and burning in her throat sensation that typically precedes vertigo. She always gets a headache before or during dizzy episodes.  Pt with hx of migraines. She takes ibuprofen or Tylenol to treat her symptoms. She used to have migraines frequently. She says now headaches seem more sinus-related. Headaches now are more mild and are felt in her sinuses or top of head. She does have ringing in her ears sometimes, sometimes occurs before headache, particularly in R ear, does have pain in L ear.  She reports she has been diagnosed with bilateral hearing loss. She reports her balance is not good and states it is like her equilibrium is off, affects ability to walk up steps, walk around corners. She reports no falls in last 6 months.  She says her legs can hurt at night; this occurred when she took a particular statin. She reports her sleep is not good, she has sleep apnea but has not been using CPAP until yesterday.  PT just moved back to the area and does not have a neurologist here currently. Other information: Pt can have dizziness watching TV, no dizziness looking at her phone, no dizziness in busy stores, no issues with symptoms as passenger in a car. She gets seasick, used to like go fishing, but had to stop.  Pt likes to dance and cook, and she likes to travel.  Treadmills make her dizzy. She goes to the gym, does everything except for treadmill/bikes.   PMH per chart significant for acute kidney failure, atherosclerotic heart disease of native coronary artery without angina pectoris, chronic kidney disease, congestive heart failure, DM, endometrial intraepithelial neoplasia, enlarged heart, folate deficiency anemia, HTN, OSA, osteonecrosis, osteoporosis, vitamin B12 deficiency, Vitamin D deficiency, migraines please  refer to chart for full PMH  PAIN:  Are you having pain? No  PRECAUTIONS: None and Fall  WEIGHT BEARING RESTRICTIONS: No  FALLS: Has patient fallen in last 6 months? No  LIVING ENVIRONMENT: Lives with: lives with their spouse Stairs: 2 to get into her home no handrails, 12 inside has bilateral handrails Has following equipment at home:  PLOF: Independent  PATIENT GOALS: Get rid of the vertigo   OBJECTIVE:  Note: Objective measures were completed at Evaluation unless otherwise noted.  DIAGNOSTIC FINDINGS:  CT HEAD 03/14/23:  FINDINGS: Brain: No evidence of acute infarction, hemorrhage, hydrocephalus, extra-axial collection or mass lesion/mass effect. Similar patchy white matter hypodensities are nonspecific but compatible with chronic microvascular ischemic disease.   Vascular: No hyperdense vessel.   Skull: No acute fracture.   Sinuses/Orbits: Clear sinuses.  No acute orbital findings.   Other: No mastoid effusions.   IMPRESSION: No evidence of acute intracranial abnormality.    Electronically Signed  By: Gilmore GORMAN Molt M.D.   On: 03/14/2023 12:18  COGNITION: Overall cognitive status: Within functional limits for tasks assessed   SENSATION: Pt reports occ numbness/tingling in her legs -was present when taking statin   POSTURE:  Rounded shoulders, slight increase kyphosis/fwd head  Cervical ROM:   AROM screen - formal assessment deferred, assessing to determine if pt has sufficient range of motion for vestibular tests, not for normative values  Rotation bilat WFL for testing - no pain Flex - WFL for testing and pain free Ext - WFL for testing - notices headache (has at baseline) with ext more than flex  STRENGTH: 10/23/23 MMT UE/LE  -UE strength grossly 4+/5 bilat UE  -LE strength grossly 4+/5 bilat LE, greatest defictis proximal mm  BED MOBILITY:  Bed mobility can make pt dizzy (rolling, supine>sit)  TRANSFERS: Assistive device utilized: None   Sit to stand: Complete Independence Stand to sit: Complete Independence Chair to chair: Complete Independence GAIT: Gait pattern: increased variability in BOS with head turns/scanning, scanning impairs gait mechanics with pt becoming slightly unsteady Distance walked: clinic distances Assistive device utilized: None Level of assistance: SBA and CGA  FUNCTIONAL TESTS:  Dynamic Gait Index: 21/24  PATIENT SURVEYS:  DHI deferred to future visit  VESTIBULAR ASSESSMENT: OCULOMOTOR EXAM:  Ocular Alignment: normal  Ocular ROM: No Limitations  Spontaneous Nystagmus: absent  Gaze-Induced Nystagmus: absent  Smooth Pursuits: intact - does make pt feel dizzy, described as lightheaded   Saccades: catch-up saccade (undershooting) with L gaze, corrective saccade (undershooting) looking up - some symptoms but dizziness not as bas as with pursuits   Convergence/Divergence:  sees double 2 from face, converges, some dizziness (described as light-headed)   VESTIBULAR - OCULAR REFLEX:   Slow VOR: Normal - makes pt a little dizzy  VOR Cancellation: Normal - makes pt a little dizzy   Head-Impulse Test: Positive to L with corrective saccade - makes pt dizzy but also movement helps headache; positive to R with undershooting and corrective saccade   POSITIONAL TESTING: deferred to future visit                                                                                                                           TREATMENT DATE: 11/14/23   Cardio endurance/LE endurance training emphasis  Lvl 2 x 2 min  Lvl 3 x 3 min Lvl 4 x 1 min  Totaling 6 min and 528 steps SPM 80-100s   TA: to promote conditioning, symptom modulation, activity tolerance  STS 2x15reps - added 3lb dumbbell in each hand for increased challenge and increased repetition  Standing hip abduction 2x12 each LE against RTB resistance, requires max cuing and external target for improved technique, which was still challenging for  patient Difficulty with eccentric control against band resistance  Standing hip ext 2x10 each LE 1st set against RTB resistance, again pt requiring max cuing/visual demonstration for improved form/technique as pt compensating with knee flexion 2nd set without resistance  to focus on improved form/technique Standing march in // bars with B UE support 2x10reps each LE  In // bars walking mini-lunges 3 laps using  BUE support  Paloff press  7.5# x12reps per side Requires frequent resetting to correct form Pt reports feeling this in her core    PATIENT EDUCATION: Education details: exercise technique Person educated: Patient Education method: Explanation, Demonstration, and Verbal cues,  Education comprehension: verbalized understanding, returned demonstration, and needs further education  HOME EXERCISE PROGRAM: Access Code: [email or web address] URL: [email or web address] Date: 10/03/2023 Prepared by: Connell Kiss  Exercises - Walking  - 1 x daily - 3 x weekly - 1 sets - 3-6 minutes hold - Recumbent Bike  - 1 x daily - 3 x weekly - 1 sets - 6 minutes hold - Seated Scapular Retraction  - 1 x daily - 7 x weekly - 2 sets - 10 reps - Seated Shoulder Shrugs  - 1 x daily - 7 x weekly - 2 sets - 10 reps - Seated Gaze Stabilization with Head Rotation  - 1 x daily - 7 x weekly - 2 sets - 30 seconds hold - Seated Gaze Stabilization with Head Nod  - 1 x daily - 7 x weekly - 2 sets - 30 seconds hold  GOALS: Goals reviewed with patient? Yes, initiated, to continue with further assessment  SHORT TERM GOALS: Target date: 12/26/2023   Patient will be independent in home exercise program to improve balance, dizziness and mobility for increased QOL and ease with ADLs. Baseline: initiated; 6/4: Pt doing HEP every day Goal status: MET  LONG TERM GOALS: Target date: 02/06/2024    Patient will reduce dizziness handicap inventory score to <50, for less dizziness with ADLs and increased safety  with home and work tasks. .  Baseline: 09/09/2023: 28;  6/4: 48 Goal status: ONGOING  2.  Patient will report at least a 30% reduction in dizzy symptoms with provoking movements since start of PT to indicate increased ease with ADLs and mobility. Baseline: horizontal, vertical head movements, bed mobility and other movements currently make pt dizzy; 10/16/22: symptoms unchanged/dizziness with these activities, but no vertigo episodes  Goal status: ONGOING  3.  Patient will increase dynamic gait index score to at least 23/24 as to demonstrate reduced fall risk and improved dynamic gait balance for better safety with community/home ambulation.  Baseline: 21/24 most difficulty with head turn activities; 10/16/22: 20/24 Goal status: ONGOING  ASSESSMENT:  CLINICAL IMPRESSION:  Pt again able to progress cardio conditioning intervention on nustep this visit without significant fatigue and without reports of dizziness, indicating improved activity tolerance. Therapist continued with progression of B LE functional strengthening exercises with pt having significant difficulty performing with correct form against RTB resistance. Therapist also continued newly introduced Pavlov exercise targeting UE and core conditioning. Patient tolerated all progressions well during session today; however, was very challenged by them. Ms. Bores will benefit from further skilled PT to improve dizziness, gait, balance and mobility in order to increase ease and safety with ADLs and QOL.  OBJECTIVE IMPAIRMENTS: Abnormal gait, decreased balance, difficulty walking, dizziness, impaired sensation, improper body mechanics, postural dysfunction, and pain.   ACTIVITY LIMITATIONS: bending, stairs, bed mobility, locomotion level, and activities in general can be impacted/impaired if pt having dizzy episode  PARTICIPATION LIMITATIONS: meal prep, cleaning, driving, shopping, community activity, yard work, and ADLs generally impaired it  pt having dizzy episode  PERSONAL FACTORS: Age, Sex, Time since onset of injury/illness/exacerbation, and 3+ comorbidities: PMH per  chart significant for acute kidney failure, atherosclerotic heart disease of native coronary artery without angina pectoris, chronic kidney disease, congestive heart failure, DM, endometrial intraepithelial neoplasia, enlarged heart, folate deficiency anemia, HTN, OSA, osteonecrosis, osteoporosis, vitamin B12 deficiency, Vitamin D deficiency, migraines please refer to chart for full PMH are also affecting patient's functional outcome.   REHAB POTENTIAL: Good  CLINICAL DECISION MAKING: Evolving/moderate complexity  EVALUATION COMPLEXITY: Moderate   PLAN:  PT FREQUENCY: 1-2x/week  PT DURATION: 12 weeks  PLANNED INTERVENTIONS: 97164- PT Re-evaluation, 97750- Physical Performance Testing, 97110-Therapeutic exercises, 97530- Therapeutic activity, [id number]- Neuromuscular re-education, 97535- Self Care, [id number]- Manual therapy, [id number]- Gait training, [phone]- Orthotic Initial, [phone]- Orthotic/Prosthetic subsequent, [phone]- Canalith repositioning, [id number]- Electrical stimulation (unattended), [phone]- Electrical stimulation (manual), Patient/Family education, Balance training, Stair training, Dry Needling, Joint mobilization, Spinal mobilization, Vestibular training, DME instructions, Cryotherapy, and Moist heat  PLAN FOR NEXT SESSION:    - follow-up on recommendations for: - increased daily water intake - set-up with cardiologist for BP management and follow-up pt reports of calcifications in arteries of her neck - recheck orthostatic hypotension if symptomatic  - SBP dropped by 13 on 09/26/2023 - continue cardio conditioning followed by gaze stabilization  - reinforce education on working at 5/10 on Borg RPE scale - advance HEP as indicated - balance interventions - dynamic gait training with turning - continue core strengthening    Jesslyn Viglione, PT, DPT, NCS,  CSRS Physical Therapist - [facility]  [facility]  11:01 AM 11/14/23

## 2023-11-19 ENCOUNTER — Ambulatory Visit

## 2023-11-19 DIAGNOSIS — R262 Difficulty in walking, not elsewhere classified: Secondary | ICD-10-CM

## 2023-11-19 DIAGNOSIS — R2681 Unsteadiness on feet: Secondary | ICD-10-CM

## 2023-11-19 DIAGNOSIS — R42 Dizziness and giddiness: Secondary | ICD-10-CM

## 2023-11-19 NOTE — Therapy (Signed)
 OUTPATIENT PHYSICAL THERAPY TREATMENT/RECERT  Patient Name: Monique Graham MRN: [id number] DOB:06-30-1955, 68 y.o., female Today's Date: 11/19/2023  END OF SESSION:   PT End of Session - 11/19/23 1416     Visit Number 15    Number of Visits 30    Date for PT Re-Evaluation 01/14/24    Progress Note Due on Visit 20    PT Start Time 1412    PT Stop Time 1446    PT Time Calculation (min) 34 min    Equipment Utilized During Treatment Gait belt    Activity Tolerance Patient tolerated treatment well;No increased pain;Other (comment)   limited by increased dizzy symptoms today   Behavior During Therapy WFL for tasks assessed/performed           Past Medical History:  Diagnosis Date   Acute kidney failure (HCC)    Atherosclerotic heart disease of native coronary artery without angina pectoris    Chronic kidney disease (CKD)    Congestive heart failure (CHF) (HCC)    Coronary atherosclerosis due to calcified coronary lesion    Cyst of left kidney    Diabetes mellitus without complication (HCC)    Diverticulosis of large intestine without perforation or abscess without bleeding    Endometrial intraepithelial neoplasia (EIN)    Enlarged heart    Folate deficiency anemia    Gastric ulcer    Hyperlipidemia    Hypertension    Migraine    Nonerosive esophageal reflux disease    Nonrheumatic mitral (valve) insufficiency    Obstructive sleep apnea    Osteonecrosis (HCC)    Osteoporosis    Other specified disorders of bone density and structure, multiple sites    Ovarian cyst    Polycystic ovary syndrome    Premature ventricular contractions    Restless leg syndrome    Vaginal Pap smear, abnormal    Vitamin B12 deficiency    Vitamin D deficiency    Past Surgical History:  Procedure Laterality Date   ABDOMINAL HYSTERECTOMY     OOPHORECTOMY     Patient Active Problem List   Diagnosis Date Noted   Intermittent palpitations 10/08/2023   Benign hypertension with stage  3a chronic kidney disease (HCC) 10/08/2023   Benign hypertension with CKD (chronic kidney disease) stage III (HCC) 10/04/2023   Class 3 severe obesity due to excess calories with serious comorbidity and body mass index (BMI) of 40.0 to 44.9 in adult 10/04/2023   Hair loss 06/20/2023   Sleep apnea 06/20/2023   Otalgia, left 04/10/2023   Vertigo 04/10/2023   Class 2 obesity due to excess calories with body mass index (BMI) of 38.0 to 38.9 in adult 04/10/2023   Non-seasonal allergic rhinitis 04/10/2023   Class 2 severe obesity due to excess calories with serious comorbidity and body mass index (BMI) of 39.0 to 39.9 in adult (HCC) 03/11/2023   Dyslipidemia due to type 2 diabetes mellitus (HCC) 03/11/2023   Type 2 diabetes mellitus with stage 3a chronic kidney disease, without long-term current use of insulin (HCC) 03/11/2023   Vitamin B12 deficiency 01/30/2023   Cystitis 01/30/2023   Acute right flank pain 01/30/2023   Establishing care with new doctor, encounter for 01/30/2023   Hyperlipidemia LDL goal <100 01/30/2023   Intractable chronic migraine without aura and without status migrainosus 01/30/2023   Hypertension 01/30/2023   Immunization due 01/30/2023    PCP: Petrina Pries, NP REFERRING PROVIDER: Palmer Purchase, PA-C  REFERRING DIAG: vertigo   THERAPY DIAG:  Dizziness and  giddiness  Unsteadiness on feet  Difficulty in walking, not elsewhere classified  ONSET DATE: May 2024  Rationale for Evaluation and Treatment: Rehabilitation  SUBJECTIVE:  SUBJECTIVE STATEMENT:   Pt reports today is not a good symptom day. She reports dizziness and stumbling over her feet. She reports numbness in LLE from knee to foot. She reports slight headache, but says not usual headache/doesn't hurt as bad as usual headache, describes as faint. Reports shallow breathing, says started today. Pt reports she is sleepy today, did not sleep well last night. She reports her sleep has been bad lately in  general. She reports no back pain. She reports no pain in her legs or swelling. Pt reports she's not been having any vertigo episodes just other general dizziness. Pt reports blood sugar was in normal range this morning, but did note over past week it has been higher.   Pt states she has appointment with Neurologist on August 12th.    PERTINENT HISTORY:  The pt is a pleasant 68 y/o female presenting to PT for vertigo/dizziness and imbalance. Pt reports onset of dizziness started May last year. She was driving and dizziness began out of nowhere. It lasted for 3 minutes at the time. She reports frequency of dizzy episodes has increased a bit more now since last year. She reports a headache and burning in her throat sensation that typically precedes vertigo. She always gets a headache before or during dizzy episodes.  Pt with hx of migraines. She takes ibuprofen or Tylenol to treat her symptoms. She used to have migraines frequently. She says now headaches seem more sinus-related. Headaches now are more mild and are felt in her sinuses or top of head. She does have ringing in her ears sometimes, sometimes occurs before headache, particularly in R ear, does have pain in L ear.  She reports she has been diagnosed with bilateral hearing loss. She reports her balance is not good and states it is like her equilibrium is off, affects ability to walk up steps, walk around corners. She reports no falls in last 6 months.  She says her legs can hurt at night; this occurred when she took a particular statin. She reports her sleep is not good, she has sleep apnea but has not been using CPAP until yesterday.  PT just moved back to the area and does not have a neurologist here currently. Other information: Pt can have dizziness watching TV, no dizziness looking at her phone, no dizziness in busy stores, no issues with symptoms as passenger in a car. She gets seasick, used to like go fishing, but had to stop.  Pt likes to  dance and cook, and she likes to travel.  Treadmills make her dizzy. She goes to the gym, does everything except for treadmill/bikes.   PMH per chart significant for acute kidney failure, atherosclerotic heart disease of native coronary artery without angina pectoris, chronic kidney disease, congestive heart failure, DM, endometrial intraepithelial neoplasia, enlarged heart, folate deficiency anemia, HTN, OSA, osteonecrosis, osteoporosis, vitamin B12 deficiency, Vitamin D deficiency, migraines please refer to chart for full PMH  PAIN:  Are you having pain? No  PRECAUTIONS: None and Fall  WEIGHT BEARING RESTRICTIONS: No  FALLS: Has patient fallen in last 6 months? No  LIVING ENVIRONMENT: Lives with: lives with their spouse Stairs: 2 to get into her home no handrails, 12 inside has bilateral handrails Has following equipment at home:  PLOF: Independent  PATIENT GOALS: Get rid of the vertigo  OBJECTIVE:  Note: Objective measures were completed at Evaluation unless otherwise noted.  DIAGNOSTIC FINDINGS:  CT HEAD 03/14/23:  FINDINGS: Brain: No evidence of acute infarction, hemorrhage, hydrocephalus, extra-axial collection or mass lesion/mass effect. Similar patchy white matter hypodensities are nonspecific but compatible with chronic microvascular ischemic disease.   Vascular: No hyperdense vessel.   Skull: No acute fracture.   Sinuses/Orbits: Clear sinuses.  No acute orbital findings.   Other: No mastoid effusions.   IMPRESSION: No evidence of acute intracranial abnormality.    Electronically Signed   By: Gilmore GORMAN Molt M.D.   On: 03/14/2023 12:18  COGNITION: Overall cognitive status: Within functional limits for tasks assessed   SENSATION: Pt reports occ numbness/tingling in her legs -was present when taking statin   POSTURE:  Rounded shoulders, slight increase kyphosis/fwd head  Cervical ROM:   AROM screen - formal assessment deferred, assessing to  determine if pt has sufficient range of motion for vestibular tests, not for normative values  Rotation bilat WFL for testing - no pain Flex - WFL for testing and pain free Ext - WFL for testing - notices headache (has at baseline) with ext more than flex  STRENGTH: 10/23/23 MMT UE/LE  -UE strength grossly 4+/5 bilat UE  -LE strength grossly 4+/5 bilat LE, greatest defictis proximal mm  BED MOBILITY:  Bed mobility can make pt dizzy (rolling, supine>sit)  TRANSFERS: Assistive device utilized: None  Sit to stand: Complete Independence Stand to sit: Complete Independence Chair to chair: Complete Independence GAIT: Gait pattern: increased variability in BOS with head turns/scanning, scanning impairs gait mechanics with pt becoming slightly unsteady Distance walked: clinic distances Assistive device utilized: None Level of assistance: SBA and CGA  FUNCTIONAL TESTS:  Dynamic Gait Index: 21/24  PATIENT SURVEYS:  DHI deferred to future visit  VESTIBULAR ASSESSMENT: OCULOMOTOR EXAM:  Ocular Alignment: normal  Ocular ROM: No Limitations  Spontaneous Nystagmus: absent  Gaze-Induced Nystagmus: absent  Smooth Pursuits: intact - does make pt feel dizzy, described as lightheaded   Saccades: catch-up saccade (undershooting) with L gaze, corrective saccade (undershooting) looking up - some symptoms but dizziness not as bas as with pursuits   Convergence/Divergence:  sees double 2 from face, converges, some dizziness (described as light-headed)   VESTIBULAR - OCULAR REFLEX:   Slow VOR: Normal - makes pt a little dizzy  VOR Cancellation: Normal - makes pt a little dizzy   Head-Impulse Test: Positive to L with corrective saccade - makes pt dizzy but also movement helps headache; positive to R with undershooting and corrective saccade   POSITIONAL TESTING: deferred to future visit                                                                                                                            TREATMENT DATE: 11/19/23   NMR: BP seated LUE: 142/83 mmHg HR 74 bpm   Physical Performance: PT instructed pt in DGI. See below for results. Demonstrates increased fall risk with  score of 19/24. (<19 indicates increased fall risk)   [facility] PT Assessment - 11/19/23 0001       Dynamic Gait Index   Level Surface Normal    Change in Gait Speed Mild Impairment    Gait with Horizontal Head Turns Mild Impairment    Gait with Vertical Head Turns Mild Impairment    Gait and Pivot Turn Mild Impairment    Step Over Obstacle Normal    Step Around Obstacles Normal    Steps Mild Impairment    Total Score 19          NMR: DHI 46  Seated VORx1 horizontal head turns x 30 sec discontinued due to high levels of symptoms  Self-Care: PT instructs pt to continue to monitor symptoms closely, including blood sugar and BP levels at home. PT reviewed signs and symptoms to watch for that would indicate pt needs to seek emergency care (go to ED and/or call 911).    Please refer to goal section and assessment below for full details of complete assessment  Provided pt with ice pack around shoulders for symptom modulation x 3 min - pt reports this feels good (unbilled x 4 min)      PATIENT EDUCATION: Education details: exercise technique Person educated: Patient Education method: Programmer, multimedia, Demonstration, and Verbal cues,  Education comprehension: verbalized understanding, returned demonstration, and needs further education  HOME EXERCISE PROGRAM: Access Code: [email or web address] URL: [email or web address] Date: 10/03/2023 Prepared by: Connell Kiss  Exercises - Walking  - 1 x daily - 3 x weekly - 1 sets - 3-6 minutes hold - Recumbent Bike  - 1 x daily - 3 x weekly - 1 sets - 6 minutes hold - Seated Scapular Retraction  - 1 x daily - 7 x weekly - 2 sets - 10 reps - Seated Shoulder Shrugs  - 1 x daily - 7 x weekly - 2 sets - 10 reps - Seated Gaze Stabilization with Head  Rotation  - 1 x daily - 7 x weekly - 2 sets - 30 seconds hold - Seated Gaze Stabilization with Head Nod  - 1 x daily - 7 x weekly - 2 sets - 30 seconds hold  GOALS: Goals reviewed with patient? Yes, initiated, to continue with further assessment  SHORT TERM GOALS: Target date: 12/17/2023    Patient will be independent in home exercise program to improve balance, dizziness and mobility for increased QOL and ease with ADLs. Baseline: initiated; 6/4: Pt doing HEP every day Goal status: MET  LONG TERM GOALS: Target date: 01/14/2024    Patient will reduce dizziness handicap inventory score to <50, for less dizziness with ADLs and increased safety with home and work tasks. .  Baseline: 09/09/2023: 28;  6/4: 48; 11/19/23: 46   Goal status: ONGOING  2.  Patient will report at least a 30% reduction in dizzy symptoms with provoking movements since start of PT to indicate increased ease with ADLs and mobility. Baseline: horizontal, vertical head movements, bed mobility and other movements currently make pt dizzy; 10/16/22: symptoms unchanged/dizziness with these activities, but no vertigo episodes; 11/19/23: reports intermittent improvement with vertical and horizontal head movements (sometimes dizzy, sometimes not), still stumbling getting out of bed requiring support  Goal status: ONGOING  3.  Patient will increase dynamic gait index score to at least 23/24 as to demonstrate reduced fall risk and improved dynamic gait balance for better safety with community/home ambulation.  Baseline: 21/24 most difficulty with head turn activities; 10/16/22: 20/24; 11/19/23:  29/24 Goal status: ONGOING  ASSESSMENT:  CLINICAL IMPRESSION:  Goal reassessment completed for recert. Pt with modest improvement on DHI compared to prior assessment from 6/4 and reports of intermittent gains with horizontal and vertical head movements and dizzy symptoms. While pt making some gains, she did present with what appears to be symptom  flare-up and performed slightly worse on DGI (by one point), exhibiting slightly more unsteadiness. Patient's condition has the potential to improve in response to therapy. Maximum improvement is yet to be obtained. The anticipated improvement is attainable and reasonable in a generally predictable time.   Monique Graham will benefit from further skilled PT to improve dizziness, gait, balance and mobility in order to increase ease and safety with ADLs and QOL.  OBJECTIVE IMPAIRMENTS: Abnormal gait, decreased balance, difficulty walking, dizziness, impaired sensation, improper body mechanics, postural dysfunction, and pain.   ACTIVITY LIMITATIONS: bending, stairs, bed mobility, locomotion level, and activities in general can be impacted/impaired if pt having dizzy episode  PARTICIPATION LIMITATIONS: meal prep, cleaning, driving, shopping, community activity, yard work, and ADLs generally impaired it pt having dizzy episode  PERSONAL FACTORS: Age, Sex, Time since onset of injury/illness/exacerbation, and 3+ comorbidities: PMH per chart significant for acute kidney failure, atherosclerotic heart disease of native coronary artery without angina pectoris, chronic kidney disease, congestive heart failure, DM, endometrial intraepithelial neoplasia, enlarged heart, folate deficiency anemia, HTN, OSA, osteonecrosis, osteoporosis, vitamin B12 deficiency, Vitamin D deficiency, migraines please refer to chart for full PMH are also affecting patient's functional outcome.   REHAB POTENTIAL: Good  CLINICAL DECISION MAKING: Evolving/moderate complexity  EVALUATION COMPLEXITY: Moderate   PLAN:  PT FREQUENCY: 1-2x/week  PT DURATION: 8 weeks  PLANNED INTERVENTIONS: 97164- PT Re-evaluation, 97750- Physical Performance Testing, 97110-Therapeutic exercises, 97530- Therapeutic activity, [id number]- Neuromuscular re-education, 97535- Self Care, [id number]- Manual therapy, [id number]- Gait training, [phone]- Orthotic Initial, [phone]  Orthotic/Prosthetic subsequent, [phone]- Canalith repositioning, [id number]- Electrical stimulation (unattended), [phone]- Electrical stimulation (manual), Patient/Family education, Balance training, Stair training, Dry Needling, Joint mobilization, Spinal mobilization, Vestibular training, DME instructions, Cryotherapy, and Moist heat  PLAN FOR NEXT SESSION:    - follow-up on recommendations for: - increased daily water intake - set-up with cardiologist for BP management and follow-up pt reports of calcifications in arteries of her neck - recheck orthostatic hypotension if symptomatic  - SBP dropped by 13 on 09/26/2023 - continue cardio conditioning followed by gaze stabilization  - reinforce education on working at 5/10 on Borg RPE scale - advance HEP as indicated - balance interventions - dynamic gait training with turning - continue core strengthening -gaze stabilization, VOR as pt able    Darryle Patten PT, DPT  Physical Therapist - [facility]  3:02 PM 11/19/23

## 2023-11-24 LAB — EMPOWER MULTI-CANCER (2 + 38): REPORT SUMMARY: NEGATIVE

## 2023-11-25 ENCOUNTER — Ambulatory Visit

## 2023-11-25 DIAGNOSIS — M6281 Muscle weakness (generalized): Secondary | ICD-10-CM

## 2023-11-25 DIAGNOSIS — R262 Difficulty in walking, not elsewhere classified: Secondary | ICD-10-CM

## 2023-11-25 DIAGNOSIS — M542 Cervicalgia: Secondary | ICD-10-CM

## 2023-11-25 DIAGNOSIS — R2681 Unsteadiness on feet: Secondary | ICD-10-CM

## 2023-11-25 DIAGNOSIS — R42 Dizziness and giddiness: Secondary | ICD-10-CM

## 2023-11-25 NOTE — Therapy (Addendum)
 OUTPATIENT PHYSICAL THERAPY TREATMENT  Patient Name: Monique Graham MRN: [id number] DOB:1955/07/14, 68 y.o., female Today's Date: 11/26/2023  END OF SESSION:   PT End of Session - 11/25/23 1318     Visit Number 16    Number of Visits 30    Date for PT Re-Evaluation 01/14/24    Progress Note Due on Visit 20    PT Start Time 1316    PT Stop Time 1359    PT Time Calculation (min) 43 min    Equipment Utilized During Treatment Gait belt    Activity Tolerance Patient tolerated treatment well;No increased pain   limited by increased dizzy symptoms today   Behavior During Therapy WFL for tasks assessed/performed           Past Medical History:  Diagnosis Date   Acute kidney failure (HCC)    Atherosclerotic heart disease of native coronary artery without angina pectoris    Chronic kidney disease (CKD)    Congestive heart failure (CHF) (HCC)    Coronary atherosclerosis due to calcified coronary lesion    Cyst of left kidney    Diabetes mellitus without complication (HCC)    Diverticulosis of large intestine without perforation or abscess without bleeding    Endometrial intraepithelial neoplasia (EIN)    Enlarged heart    Folate deficiency anemia    Gastric ulcer    Hyperlipidemia    Hypertension    Migraine    Nonerosive esophageal reflux disease    Nonrheumatic mitral (valve) insufficiency    Obstructive sleep apnea    Osteonecrosis (HCC)    Osteoporosis    Other specified disorders of bone density and structure, multiple sites    Ovarian cyst    Polycystic ovary syndrome    Premature ventricular contractions    Restless leg syndrome    Vaginal Pap smear, abnormal    Vitamin B12 deficiency    Vitamin D deficiency    Past Surgical History:  Procedure Laterality Date   ABDOMINAL HYSTERECTOMY     OOPHORECTOMY     Patient Active Problem List   Diagnosis Date Noted   Intermittent palpitations 10/08/2023   Benign hypertension with stage 3a chronic kidney  disease (HCC) 10/08/2023   Benign hypertension with CKD (chronic kidney disease) stage III (HCC) 10/04/2023   Class 3 severe obesity due to excess calories with serious comorbidity and body mass index (BMI) of 40.0 to 44.9 in adult 10/04/2023   Hair loss 06/20/2023   Sleep apnea 06/20/2023   Otalgia, left 04/10/2023   Vertigo 04/10/2023   Class 2 obesity due to excess calories with body mass index (BMI) of 38.0 to 38.9 in adult 04/10/2023   Non-seasonal allergic rhinitis 04/10/2023   Class 2 severe obesity due to excess calories with serious comorbidity and body mass index (BMI) of 39.0 to 39.9 in adult (HCC) 03/11/2023   Dyslipidemia due to type 2 diabetes mellitus (HCC) 03/11/2023   Type 2 diabetes mellitus with stage 3a chronic kidney disease, without long-term current use of insulin (HCC) 03/11/2023   Vitamin B12 deficiency 01/30/2023   Cystitis 01/30/2023   Acute right flank pain 01/30/2023   Establishing care with new doctor, encounter for 01/30/2023   Hyperlipidemia LDL goal <100 01/30/2023   Intractable chronic migraine without aura and without status migrainosus 01/30/2023   Hypertension 01/30/2023   Immunization due 01/30/2023    PCP: Petrina Pries, NP REFERRING PROVIDER: Palmer Purchase, PA-C  REFERRING DIAG: vertigo   THERAPY DIAG:  Dizziness and giddiness  Unsteadiness on feet  Difficulty in walking, not elsewhere classified  Muscle weakness (generalized)  Cervicalgia  ONSET DATE: May 2024  Rationale for Evaluation and Treatment: Rehabilitation  SUBJECTIVE:  SUBJECTIVE STATEMENT:   Patient reports feeling better today than previous visit. Not dizzy upon arrival today.    Pt states she has appointment with Neurologist on August 12th.    PERTINENT HISTORY:  The pt is a pleasant 68 y/o female presenting to PT for vertigo/dizziness and imbalance. Pt reports onset of dizziness started May last year. She was driving and dizziness began out of nowhere. It  lasted for 3 minutes at the time. She reports frequency of dizzy episodes has increased a bit more now since last year. She reports a headache and burning in her throat sensation that typically precedes vertigo. She always gets a headache before or during dizzy episodes.  Pt with hx of migraines. She takes ibuprofen or Tylenol to treat her symptoms. She used to have migraines frequently. She says now headaches seem more sinus-related. Headaches now are more mild and are felt in her sinuses or top of head. She does have ringing in her ears sometimes, sometimes occurs before headache, particularly in R ear, does have pain in L ear.  She reports she has been diagnosed with bilateral hearing loss. She reports her balance is not good and states it is like her equilibrium is off, affects ability to walk up steps, walk around corners. She reports no falls in last 6 months.  She says her legs can hurt at night; this occurred when she took a particular statin. She reports her sleep is not good, she has sleep apnea but has not been using CPAP until yesterday.  PT just moved back to the area and does not have a neurologist here currently. Other information: Pt can have dizziness watching TV, no dizziness looking at her phone, no dizziness in busy stores, no issues with symptoms as passenger in a car. She gets seasick, used to like go fishing, but had to stop.  Pt likes to dance and cook, and she likes to travel.  Treadmills make her dizzy. She goes to the gym, does everything except for treadmill/bikes.   PMH per chart significant for acute kidney failure, atherosclerotic heart disease of native coronary artery without angina pectoris, chronic kidney disease, congestive heart failure, DM, endometrial intraepithelial neoplasia, enlarged heart, folate deficiency anemia, HTN, OSA, osteonecrosis, osteoporosis, vitamin B12 deficiency, Vitamin D deficiency, migraines please refer to chart for full PMH  PAIN:  Are you having  pain? No  PRECAUTIONS: None and Fall  WEIGHT BEARING RESTRICTIONS: No  FALLS: Has patient fallen in last 6 months? No  LIVING ENVIRONMENT: Lives with: lives with their spouse Stairs: 2 to get into her home no handrails, 12 inside has bilateral handrails Has following equipment at home:  PLOF: Independent  PATIENT GOALS: Get rid of the vertigo   OBJECTIVE:  Note: Objective measures were completed at Evaluation unless otherwise noted.  DIAGNOSTIC FINDINGS:  CT HEAD 03/14/23:  FINDINGS: Brain: No evidence of acute infarction, hemorrhage, hydrocephalus, extra-axial collection or mass lesion/mass effect. Similar patchy white matter hypodensities are nonspecific but compatible with chronic microvascular ischemic disease.   Vascular: No hyperdense vessel.   Skull: No acute fracture.   Sinuses/Orbits: Clear sinuses.  No acute orbital findings.   Other: No mastoid effusions.   IMPRESSION: No evidence of acute intracranial abnormality.    Electronically Signed   By: Gilmore GORMAN Molt M.D.   On:  03/14/2023 12:18  COGNITION: Overall cognitive status: Within functional limits for tasks assessed   SENSATION: Pt reports occ numbness/tingling in her legs -was present when taking statin   POSTURE:  Rounded shoulders, slight increase kyphosis/fwd head  Cervical ROM:   AROM screen - formal assessment deferred, assessing to determine if pt has sufficient range of motion for vestibular tests, not for normative values  Rotation bilat WFL for testing - no pain Flex - WFL for testing and pain free Ext - WFL for testing - notices headache (has at baseline) with ext more than flex  STRENGTH: 10/23/23 MMT UE/LE  -UE strength grossly 4+/5 bilat UE  -LE strength grossly 4+/5 bilat LE, greatest defictis proximal mm  BED MOBILITY:  Bed mobility can make pt dizzy (rolling, supine>sit)  TRANSFERS: Assistive device utilized: None  Sit to stand: Complete Independence Stand to sit:  Complete Independence Chair to chair: Complete Independence GAIT: Gait pattern: increased variability in BOS with head turns/scanning, scanning impairs gait mechanics with pt becoming slightly unsteady Distance walked: clinic distances Assistive device utilized: None Level of assistance: SBA and CGA  FUNCTIONAL TESTS:  Dynamic Gait Index: 21/24  PATIENT SURVEYS:  DHI deferred to future visit  VESTIBULAR ASSESSMENT: OCULOMOTOR EXAM:  Ocular Alignment: normal  Ocular ROM: No Limitations  Spontaneous Nystagmus: absent  Gaze-Induced Nystagmus: absent  Smooth Pursuits: intact - does make pt feel dizzy, described as lightheaded   Saccades: catch-up saccade (undershooting) with L gaze, corrective saccade (undershooting) looking up - some symptoms but dizziness not as bas as with pursuits   Convergence/Divergence:  sees double 2 from face, converges, some dizziness (described as light-headed)   VESTIBULAR - OCULAR REFLEX:   Slow VOR: Normal - makes pt a little dizzy  VOR Cancellation: Normal - makes pt a little dizzy   Head-Impulse Test: Positive to L with corrective saccade - makes pt dizzy but also movement helps headache; positive to R with undershooting and corrective saccade   POSITIONAL TESTING: deferred to future visit                                                                                                                           TREATMENT DATE: 11/26/23    BP seated LUE: 143/80 mmHg HR 89 bpm   NMR:   neuromuscular  reeducation of movement, balance, coordination, kinesthetic  sense, posture and proprioception for sitting and/or standing - Performed at support bar or in corner section of gym area today. - Static stand in corner with varying feet position.   - feet apart   - Eyes open x 30 sec x 2   -Eyes closed x 30 sec x 2   - EO (horizontal and vertical Head turns) x10  - EC (horizontal and vertical Head turns) x 5 (some Dizziness)   -Progressed with above  mentioned activities in varying feet positions including narrowed standing and later in staggered standing. Most limited in staggered with eyes closed   -Tandem  standing- attempted up to 30 sec ea side -SLS - attempted- more difficulty standing on Left LE - up to 5-7 sec at best today.  -Therapeutic Activities: dynamic therapeutic activities  designed to achieve improved functional performance   Sit to stand x 10 without UE without difficulty       PATIENT EDUCATION: Education details: exercise technique Person educated: Patient Education method: Explanation, Demonstration, and Verbal cues,  Education comprehension: verbalized understanding, returned demonstration, and needs further education  HOME EXERCISE PROGRAM:  Access Code: [email or web address] URL: [email or web address] Date: 11/26/2023 Prepared by: Reyes London  Exercises - Tandem Stance  - 3 x weekly - 3 sets - 30 hold - Single Leg Stance  - 3 x weekly - 3 sets - 10-20 sec hold      Access Code: [email or web address] URL: [email or web address] Date: 10/03/2023 Prepared by: Connell Kiss  Exercises - Walking  - 1 x daily - 3 x weekly - 1 sets - 3-6 minutes hold - Recumbent Bike  - 1 x daily - 3 x weekly - 1 sets - 6 minutes hold - Seated Scapular Retraction  - 1 x daily - 7 x weekly - 2 sets - 10 reps - Seated Shoulder Shrugs  - 1 x daily - 7 x weekly - 2 sets - 10 reps - Seated Gaze Stabilization with Head Rotation  - 1 x daily - 7 x weekly - 2 sets - 30 seconds hold - Seated Gaze Stabilization with Head Nod  - 1 x daily - 7 x weekly - 2 sets - 30 seconds hold  GOALS: Goals reviewed with patient? Yes, initiated, to continue with further assessment  SHORT TERM GOALS: Target date: 12/17/2023    Patient will be independent in home exercise program to improve balance, dizziness and mobility for increased QOL and ease with ADLs. Baseline: initiated; 6/4: Pt doing HEP every day Goal status:  MET  LONG TERM GOALS: Target date: 01/14/2024    Patient will reduce dizziness handicap inventory score to <50, for less dizziness with ADLs and increased safety with home and work tasks. .  Baseline: 09/09/2023: 28;  6/4: 48; 11/19/23: 46   Goal status: ONGOING  2.  Patient will report at least a 30% reduction in dizzy symptoms with provoking movements since start of PT to indicate increased ease with ADLs and mobility. Baseline: horizontal, vertical head movements, bed mobility and other movements currently make pt dizzy; 10/16/22: symptoms unchanged/dizziness with these activities, but no vertigo episodes; 11/19/23: reports intermittent improvement with vertical and horizontal head movements (sometimes dizzy, sometimes not), still stumbling getting out of bed requiring support  Goal status: ONGOING  3.  Patient will increase dynamic gait index score to at least 23/24 as to demonstrate reduced fall risk and improved dynamic gait balance for better safety with community/home ambulation.  Baseline: 21/24 most difficulty with head turn activities; 10/16/22: 20/24; 11/19/23: 29/24 Goal status: ONGOING  ASSESSMENT:  CLINICAL IMPRESSION:   Patient instructed in beginning balance and coordination exercise. Patient required mod VCs and min A for technique  to improve static standing balance and postural control. She was limited mostly with eyes closed. Did modify some activities due to some dizziness with vestibular horizontal and vertical head motions today. Otherwise performed well with only min VC.  Ms. Dardis will benefit from further skilled PT to improve dizziness, gait, balance and mobility in order to increase ease and safety with ADLs and QOL.  OBJECTIVE IMPAIRMENTS: Abnormal gait, decreased balance, difficulty walking, dizziness, impaired sensation,  improper body mechanics, postural dysfunction, and pain.   ACTIVITY LIMITATIONS: bending, stairs, bed mobility, locomotion level, and activities in  general can be impacted/impaired if pt having dizzy episode  PARTICIPATION LIMITATIONS: meal prep, cleaning, driving, shopping, community activity, yard work, and ADLs generally impaired it pt having dizzy episode  PERSONAL FACTORS: Age, Sex, Time since onset of injury/illness/exacerbation, and 3+ comorbidities: PMH per chart significant for acute kidney failure, atherosclerotic heart disease of native coronary artery without angina pectoris, chronic kidney disease, congestive heart failure, DM, endometrial intraepithelial neoplasia, enlarged heart, folate deficiency anemia, HTN, OSA, osteonecrosis, osteoporosis, vitamin B12 deficiency, Vitamin D deficiency, migraines please refer to chart for full PMH are also affecting patient's functional outcome.   REHAB POTENTIAL: Good  CLINICAL DECISION MAKING: Evolving/moderate complexity  EVALUATION COMPLEXITY: Moderate   PLAN:  PT FREQUENCY: 1-2x/week  PT DURATION: 8 weeks  PLANNED INTERVENTIONS: 97164- PT Re-evaluation, 97750- Physical Performance Testing, 97110-Therapeutic exercises, 97530- Therapeutic activity, 97112- Neuromuscular re-education, 97535- Self Care, [id number]- Manual therapy, [id number]- Gait training, [phone]- Orthotic Initial, [phone]- Orthotic/Prosthetic subsequent, [phone]- Canalith repositioning, [id number]- Electrical stimulation (unattended), [phone]- Electrical stimulation (manual), Patient/Family education, Balance training, Stair training, Dry Needling, Joint mobilization, Spinal mobilization, Vestibular training, DME instructions, Cryotherapy, and Moist heat  PLAN FOR NEXT SESSION:     -REVIEW and progress balance HEP - follow-up on recommendations for: - increased daily water intake - set-up with cardiologist for BP management and follow-up pt reports of calcifications in arteries of her neck - recheck orthostatic hypotension if symptomatic  - SBP dropped by 13 on 09/26/2023 - continue cardio conditioning followed by gaze  stabilization  - reinforce education on working at 5/10 on Borg RPE scale - advance HEP as indicated - balance interventions - dynamic gait training with turning - continue core strengthening -gaze stabilization, VOR as pt able    Chyrl London, PT Physical Therapist - [facility]  [facility]  8:27 AM 11/26/23

## 2023-11-27 ENCOUNTER — Ambulatory Visit: Payer: Self-pay | Admitting: Obstetrics and Gynecology

## 2023-11-27 ENCOUNTER — Ambulatory Visit

## 2023-12-02 ENCOUNTER — Ambulatory Visit

## 2023-12-02 DIAGNOSIS — R42 Dizziness and giddiness: Secondary | ICD-10-CM | POA: Diagnosis not present

## 2023-12-02 DIAGNOSIS — M542 Cervicalgia: Secondary | ICD-10-CM

## 2023-12-02 DIAGNOSIS — M6281 Muscle weakness (generalized): Secondary | ICD-10-CM

## 2023-12-02 DIAGNOSIS — R262 Difficulty in walking, not elsewhere classified: Secondary | ICD-10-CM

## 2023-12-02 DIAGNOSIS — R2681 Unsteadiness on feet: Secondary | ICD-10-CM

## 2023-12-02 NOTE — Therapy (Signed)
 OUTPATIENT PHYSICAL THERAPY TREATMENT  Patient Name: Monique Graham MRN: [id number] DOB:Sep 16, 1955, 68 y.o., female Today's Date: 12/02/2023  END OF SESSION:   PT End of Session - 12/02/23 1321     Visit Number 17    Number of Visits 30    Date for PT Re-Evaluation 01/14/24    Progress Note Due on Visit 20    PT Start Time 1315    PT Stop Time 1355    PT Time Calculation (min) 40 min    Activity Tolerance Patient tolerated treatment well           Past Medical History:  Diagnosis Date   Acute kidney failure (HCC)    Atherosclerotic heart disease of native coronary artery without angina pectoris    Chronic kidney disease (CKD)    Congestive heart failure (CHF) (HCC)    Coronary atherosclerosis due to calcified coronary lesion    Cyst of left kidney    Diabetes mellitus without complication (HCC)    Diverticulosis of large intestine without perforation or abscess without bleeding    Endometrial intraepithelial neoplasia (EIN)    Enlarged heart    Folate deficiency anemia    Gastric ulcer    Hyperlipidemia    Hypertension    Migraine    Nonerosive esophageal reflux disease    Nonrheumatic mitral (valve) insufficiency    Obstructive sleep apnea    Osteonecrosis (HCC)    Osteoporosis    Other specified disorders of bone density and structure, multiple sites    Ovarian cyst    Polycystic ovary syndrome    Premature ventricular contractions    Restless leg syndrome    Vaginal Pap smear, abnormal    Vitamin B12 deficiency    Vitamin D deficiency    Past Surgical History:  Procedure Laterality Date   ABDOMINAL HYSTERECTOMY     OOPHORECTOMY     Patient Active Problem List   Diagnosis Date Noted   Intermittent palpitations 10/08/2023   Benign hypertension with stage 3a chronic kidney disease (HCC) 10/08/2023   Benign hypertension with CKD (chronic kidney disease) stage III (HCC) 10/04/2023   Class 3 severe obesity due to excess calories with serious  comorbidity and body mass index (BMI) of 40.0 to 44.9 in adult 10/04/2023   Hair loss 06/20/2023   Sleep apnea 06/20/2023   Otalgia, left 04/10/2023   Vertigo 04/10/2023   Class 2 obesity due to excess calories with body mass index (BMI) of 38.0 to 38.9 in adult 04/10/2023   Non-seasonal allergic rhinitis 04/10/2023   Class 2 severe obesity due to excess calories with serious comorbidity and body mass index (BMI) of 39.0 to 39.9 in adult (HCC) 03/11/2023   Dyslipidemia due to type 2 diabetes mellitus (HCC) 03/11/2023   Type 2 diabetes mellitus with stage 3a chronic kidney disease, without long-term current use of insulin (HCC) 03/11/2023   Vitamin B12 deficiency 01/30/2023   Cystitis 01/30/2023   Acute right flank pain 01/30/2023   Establishing care with new doctor, encounter for 01/30/2023   Hyperlipidemia LDL goal <100 01/30/2023   Intractable chronic migraine without aura and without status migrainosus 01/30/2023   Hypertension 01/30/2023   Immunization due 01/30/2023    PCP: Petrina Pries, NP REFERRING PROVIDER: Palmer Purchase, PA-C  REFERRING DIAG: vertigo   THERAPY DIAG:  Dizziness and giddiness  Unsteadiness on feet  Difficulty in walking, not elsewhere classified  Muscle weakness (generalized)  Cervicalgia  ONSET DATE: May 2024  Rationale for Evaluation and Treatment:  Rehabilitation  SUBJECTIVE:  SUBJECTIVE STATEMENT:  Pt says 2 vertigo episodes over the weekend. She still does not have meclizine  still. Fees ok today.   Pt states she has appointment with Neurologist on August 12th.    PERTINENT HISTORY:  The pt is a pleasant 68 y/o female presenting to PT for vertigo/dizziness and imbalance. Pt reports onset of dizziness started May last year. She was driving and dizziness began out of nowhere. It lasted for 3 minutes at the time. She reports frequency of dizzy episodes has increased a bit more now since last year. She reports a headache and burning in her  throat sensation that typically precedes vertigo. She always gets a headache before or during dizzy episodes.  Pt with hx of migraines. She takes ibuprofen or Tylenol to treat her symptoms. She used to have migraines frequently. She says now headaches seem more sinus-related. Headaches now are more mild and are felt in her sinuses or top of head. She does have ringing in her ears sometimes, sometimes occurs before headache, particularly in R ear, does have pain in L ear.  She reports she has been diagnosed with bilateral hearing loss. She reports her balance is not good and states it is like her equilibrium is off, affects ability to walk up steps, walk around corners. She reports no falls in last 6 months.  She says her legs can hurt at night; this occurred when she took a particular statin. She reports her sleep is not good, she has sleep apnea but has not been using CPAP until yesterday.  PT just moved back to the area and does not have a neurologist here currently. Other information: Pt can have dizziness watching TV, no dizziness looking at her phone, no dizziness in busy stores, no issues with symptoms as passenger in a car. She gets seasick, used to like go fishing, but had to stop.  Pt likes to dance and cook, and she likes to travel.  Treadmills make her dizzy. She goes to the gym, does everything except for treadmill/bikes.   PAIN:  Are you having pain? No  PRECAUTIONS: None and Fall  WEIGHT BEARING RESTRICTIONS: No  FALLS: Has patient fallen in last 6 months? No  LIVING ENVIRONMENT: Lives with: lives with their spouse Stairs: 2 to get into her home no handrails, 12 inside has bilateral handrails Has following equipment at home:  PLOF: Independent  PATIENT GOALS: Get rid of the vertigo   OBJECTIVE:  Note: Objective measures were completed at Evaluation unless otherwise noted.  FUNCTIONAL TESTS:  Dynamic Gait Index: 21/24  PATIENT SURVEYS:  DHI deferred to future  visit  VESTIBULAR ASSESSMENT: OCULOMOTOR EXAM:  Ocular Alignment: normal  Ocular ROM: No Limitations  Spontaneous Nystagmus: absent  Gaze-Induced Nystagmus: absent  Smooth Pursuits: intact - does make pt feel dizzy, described as lightheaded   Saccades: catch-up saccade (undershooting) with L gaze, corrective saccade (undershooting) looking up - some symptoms but dizziness not as bas as with pursuits   Convergence/Divergence:  sees double 2 from face, converges, some dizziness (described as light-headed)   VESTIBULAR - OCULAR REFLEX:   Slow VOR: Normal - makes pt a little dizzy  VOR Cancellation: Normal - makes pt a little dizzy   Head-Impulse Test: Positive to L with corrective saccade - makes pt dizzy but also movement helps headache; positive to R with undershooting and corrective saccade  TREATMENT DATE: 12/02/23 -Nustep AA/ROM cardiorespiratory priming and optimization  Seat 7, arms 7, level 2 (increased next time), 5 minutes, SPM 100 a- seated BP assessment: Large adult cuff, Left brachial: 138/61mmHg 72bpm  -alternate forward and backward AMB in // bars x6 each (cues to scan environment for backward walking)  -alternate Right and Left side stepping in bars x6 each  -static stance on airex pad x3 minutes  -airex stance c SPC chest press x15  -airex stance c ball toss and catch x20  -airex stance head turns x40  PATIENT EDUCATION: Education details: exercise technique Person educated: Patient Education method: Explanation, Demonstration, and Verbal cues,  Education comprehension: verbalized understanding, returned demonstration, and needs further education  HOME EXERCISE PROGRAM: Access Code: [email or web address] URL: [email or web address] Date: 11/26/2023 Prepared by: Reyes London  Exercises - Tandem Stance  - 3 x weekly - 3 sets - 30 hold -  Single Leg Stance  - 3 x weekly - 3 sets - 10-20 sec hold  Access Code: [email or web address] URL: [email or web address] Date: 10/03/2023 Prepared by: Connell Kiss  Exercises - Walking  - 1 x daily - 3 x weekly - 1 sets - 3-6 minutes hold - Recumbent Bike  - 1 x daily - 3 x weekly - 1 sets - 6 minutes hold - Seated Scapular Retraction  - 1 x daily - 7 x weekly - 2 sets - 10 reps - Seated Shoulder Shrugs  - 1 x daily - 7 x weekly - 2 sets - 10 reps - Seated Gaze Stabilization with Head Rotation  - 1 x daily - 7 x weekly - 2 sets - 30 seconds hold - Seated Gaze Stabilization with Head Nod  - 1 x daily - 7 x weekly - 2 sets - 30 seconds hold  GOALS: Goals reviewed with patient? Yes, initiated, to continue with further assessment  SHORT TERM GOALS: Target date: 12/17/2023 Patient will be independent in home exercise program to improve balance, dizziness and mobility for increased QOL and ease with ADLs. Baseline: initiated; 6/4: Pt doing HEP every day Goal status: MET  LONG TERM GOALS: Target date: 01/14/2024 Patient will reduce dizziness handicap inventory score to <50, for less dizziness with ADLs and increased safety with home and work tasks. .  Baseline: 09/09/2023: 28;  6/4: 48; 11/19/23: 46   Goal status: ONGOING  2.  Patient will report at least a 30% reduction in dizzy symptoms with provoking movements since start of PT to indicate increased ease with ADLs and mobility. Baseline: horizontal, vertical head movements, bed mobility and other movements currently make pt dizzy; 10/16/22: symptoms unchanged/dizziness with these activities, but no vertigo episodes; 11/19/23: reports intermittent improvement with vertical and horizontal head movements (sometimes dizzy, sometimes not), still stumbling getting out of bed requiring support  Goal status: ONGOING  3.  Patient will increase dynamic gait index score to at least 23/24 as to demonstrate reduced fall risk and improved dynamic gait balance  for better safety with community/home ambulation.  Baseline: 21/24 most difficulty with head turn activities; 10/16/22: 20/24; 11/19/23: 29/24 Goal status: ONGOING  ASSESSMENT:  CLINICAL IMPRESSION:  Continued with vestibular exercises and balance interventions. Spent a great deal of time during session airex pad with variable activities. Pt takes a few breaks as needed, but denies any specific exacerbation of symptoms is abig way during treatment. Pt's episode remain difficult to figure out in terms of triggers. Ms. Kissel will benefit from further skilled PT to improve dizziness, gait, balance and mobility in  order to increase ease and safety with ADLs and QOL.  OBJECTIVE IMPAIRMENTS: Abnormal gait, decreased balance, difficulty walking, dizziness, impaired sensation, improper body mechanics, postural dysfunction, and pain.  ACTIVITY LIMITATIONS: bending, stairs, bed mobility, locomotion level, and activities in general can be impacted/impaired if pt having dizzy episode PARTICIPATION LIMITATIONS: meal prep, cleaning, driving, shopping, community activity, yard work, and ADLs generally impaired it pt having dizzy episode PERSONAL FACTORS: Age, Sex, Time since onset of injury/illness/exacerbation, and 3+ comorbidities: PMH per chart significant for acute kidney failure, atherosclerotic heart disease of native coronary artery without angina pectoris, chronic kidney disease, congestive heart failure, DM, endometrial intraepithelial neoplasia, enlarged heart, folate deficiency anemia, HTN, OSA, osteonecrosis, osteoporosis, vitamin B12 deficiency, Vitamin D deficiency, migraines please refer to chart for full PMH are also affecting patient's functional outcome.  REHAB POTENTIAL: Good CLINICAL DECISION MAKING: Evolving/moderate complexity EVALUATION COMPLEXITY: Moderate  PLAN:  PT FREQUENCY: 1-2x/week PT DURATION: 8 weeks PLANNED INTERVENTIONS: 02835- PT Re-evaluation, 97750- Physical Performance  Testing, 97110-Therapeutic exercises, 97530- Therapeutic activity, [id number]- Neuromuscular re-education, 97535- Self Care, 02859- Manual therapy, [phone]- Gait training, [phone]- Orthotic Initial, [phone]- Orthotic/Prosthetic subsequent, [phone]- Canalith repositioning, [id number]- Electrical stimulation (unattended), [phone]- Electrical stimulation (manual), Patient/Family education, Balance training, Stair training, Dry Needling, Joint mobilization, Spinal mobilization, Vestibular training, DME instructions, Cryotherapy, and Moist heat  PLAN FOR NEXT SESSION:     -REVIEW and progress balance HEP - follow-up on recommendations for: - increased daily water intake - set-up with cardiologist for BP management and follow-up pt reports of calcifications in arteries of her neck - recheck orthostatic hypotension if symptomatic  - SBP dropped by 13 on 09/26/2023 - continue cardio conditioning followed by gaze stabilization  - reinforce education on working at 5/10 on Borg RPE scale - advance HEP as indicated - balance interventions - dynamic gait training with turning - continue core strengthening -gaze stabilization, VOR as pt able  1:22 PM, 12/02/23 Peggye JAYSON Linear, PT, DPT Physical Therapist - [facility]  Outpatient Physical Therapy- [facility] [phone]    1:22 PM 12/02/23

## 2023-12-04 ENCOUNTER — Ambulatory Visit

## 2023-12-04 ENCOUNTER — Ambulatory Visit: Payer: Self-pay | Admitting: Family Medicine

## 2023-12-04 DIAGNOSIS — R2681 Unsteadiness on feet: Secondary | ICD-10-CM

## 2023-12-04 DIAGNOSIS — M6281 Muscle weakness (generalized): Secondary | ICD-10-CM

## 2023-12-04 DIAGNOSIS — R262 Difficulty in walking, not elsewhere classified: Secondary | ICD-10-CM

## 2023-12-04 DIAGNOSIS — R42 Dizziness and giddiness: Secondary | ICD-10-CM | POA: Diagnosis not present

## 2023-12-04 NOTE — Progress Notes (Signed)
 Please make sure to continue Repatha  injections and Zetia  daily for elevated cholesterol. A1c 6 so also continue with Ozempic  injection weekly.    Please make sure to continue Repatha  injections and Zetia  daily for elevated cholesterol. A1c 6 so also continue with Ozempic  injection weekly. Kidney function dropped slightly please do not stop taking your medicine. Thank you

## 2023-12-04 NOTE — Therapy (Signed)
 OUTPATIENT PHYSICAL THERAPY TREATMENT  Patient Name: Monique Graham MRN: [id number] DOB:22-May-1955, 68 y.o., female Today's Date: 12/04/2023  END OF SESSION:   PT End of Session - 12/04/23 1319     Visit Number 18    Number of Visits 30    Date for PT Re-Evaluation 01/14/24    Progress Note Due on Visit 20    PT Start Time 1318    PT Stop Time 1358    PT Time Calculation (min) 40 min    Activity Tolerance Patient tolerated treatment well    Behavior During Therapy WFL for tasks assessed/performed            Past Medical History:  Diagnosis Date   Acute kidney failure (HCC)    Atherosclerotic heart disease of native coronary artery without angina pectoris    Chronic kidney disease (CKD)    Congestive heart failure (CHF) (HCC)    Coronary atherosclerosis due to calcified coronary lesion    Cyst of left kidney    Diabetes mellitus without complication (HCC)    Diverticulosis of large intestine without perforation or abscess without bleeding    Endometrial intraepithelial neoplasia (EIN)    Enlarged heart    Folate deficiency anemia    Gastric ulcer    Hyperlipidemia    Hypertension    Migraine    Nonerosive esophageal reflux disease    Nonrheumatic mitral (valve) insufficiency    Obstructive sleep apnea    Osteonecrosis (HCC)    Osteoporosis    Other specified disorders of bone density and structure, multiple sites    Ovarian cyst    Polycystic ovary syndrome    Premature ventricular contractions    Restless leg syndrome    Vaginal Pap smear, abnormal    Vitamin B12 deficiency    Vitamin D deficiency    Past Surgical History:  Procedure Laterality Date   ABDOMINAL HYSTERECTOMY     OOPHORECTOMY     Patient Active Problem List   Diagnosis Date Noted   Intermittent palpitations 10/08/2023   Benign hypertension with stage 3a chronic kidney disease (HCC) 10/08/2023   Benign hypertension with CKD (chronic kidney disease) stage III (HCC) 10/04/2023    Class 3 severe obesity due to excess calories with serious comorbidity and body mass index (BMI) of 40.0 to 44.9 in adult 10/04/2023   Hair loss 06/20/2023   Sleep apnea 06/20/2023   Otalgia, left 04/10/2023   Vertigo 04/10/2023   Class 2 obesity due to excess calories with body mass index (BMI) of 38.0 to 38.9 in adult 04/10/2023   Non-seasonal allergic rhinitis 04/10/2023   Class 2 severe obesity due to excess calories with serious comorbidity and body mass index (BMI) of 39.0 to 39.9 in adult (HCC) 03/11/2023   Dyslipidemia due to type 2 diabetes mellitus (HCC) 03/11/2023   Type 2 diabetes mellitus with stage 3a chronic kidney disease, without long-term current use of insulin (HCC) 03/11/2023   Vitamin B12 deficiency 01/30/2023   Cystitis 01/30/2023   Acute right flank pain 01/30/2023   Establishing care with new doctor, encounter for 01/30/2023   Hyperlipidemia LDL goal <100 01/30/2023   Intractable chronic migraine without aura and without status migrainosus 01/30/2023   Hypertension 01/30/2023   Immunization due 01/30/2023    PCP: Petrina Pries, NP REFERRING PROVIDER: Palmer Purchase, PA-C  REFERRING DIAG: vertigo   THERAPY DIAG:  Dizziness and giddiness  Unsteadiness on feet  Difficulty in walking, not elsewhere classified  Muscle weakness (generalized)  ONSET  DATE: May 2024  Rationale for Evaluation and Treatment: Rehabilitation  SUBJECTIVE:  SUBJECTIVE STATEMENT:   Patient reports she continues experiencing dizziness bouts.   Pt states she has appointment with Neurologist on August 12th.    PERTINENT HISTORY:  The pt is a pleasant 68 y/o female presenting to PT for vertigo/dizziness and imbalance. Pt reports onset of dizziness started May last year. She was driving and dizziness began out of nowhere. It lasted for 3 minutes at the time. She reports frequency of dizzy episodes has increased a bit more now since last year. She reports a headache and burning in her  throat sensation that typically precedes vertigo. She always gets a headache before or during dizzy episodes.  Pt with hx of migraines. She takes ibuprofen or Tylenol to treat her symptoms. She used to have migraines frequently. She says now headaches seem more sinus-related. Headaches now are more mild and are felt in her sinuses or top of head. She does have ringing in her ears sometimes, sometimes occurs before headache, particularly in R ear, does have pain in L ear.  She reports she has been diagnosed with bilateral hearing loss. She reports her balance is not good and states it is like her equilibrium is off, affects ability to walk up steps, walk around corners. She reports no falls in last 6 months.  She says her legs can hurt at night; this occurred when she took a particular statin. She reports her sleep is not good, she has sleep apnea but has not been using CPAP until yesterday.  PT just moved back to the area and does not have a neurologist here currently. Other information: Pt can have dizziness watching TV, no dizziness looking at her phone, no dizziness in busy stores, no issues with symptoms as passenger in a car. She gets seasick, used to like go fishing, but had to stop.  Pt likes to dance and cook, and she likes to travel.  Treadmills make her dizzy. She goes to the gym, does everything except for treadmill/bikes.   PAIN:  Are you having pain? No  PRECAUTIONS: None and Fall  WEIGHT BEARING RESTRICTIONS: No  FALLS: Has patient fallen in last 6 months? No  LIVING ENVIRONMENT: Lives with: lives with their spouse Stairs: 2 to get into her home no handrails, 12 inside has bilateral handrails Has following equipment at home:  PLOF: Independent  PATIENT GOALS: Get rid of the vertigo   OBJECTIVE:  Note: Objective measures were completed at Evaluation unless otherwise noted.  FUNCTIONAL TESTS:  Dynamic Gait Index: 21/24  PATIENT SURVEYS:  DHI deferred to future  visit  VESTIBULAR ASSESSMENT: OCULOMOTOR EXAM:  Ocular Alignment: normal  Ocular ROM: No Limitations  Spontaneous Nystagmus: absent  Gaze-Induced Nystagmus: absent  Smooth Pursuits: intact - does make pt feel dizzy, described as lightheaded   Saccades: catch-up saccade (undershooting) with L gaze, corrective saccade (undershooting) looking up - some symptoms but dizziness not as bas as with pursuits   Convergence/Divergence:  sees double 2 from face, converges, some dizziness (described as light-headed)   VESTIBULAR - OCULAR REFLEX:   Slow VOR: Normal - makes pt a little dizzy  VOR Cancellation: Normal - makes pt a little dizzy   Head-Impulse Test: Positive to L with corrective saccade - makes pt dizzy but also movement helps headache; positive to R with undershooting and corrective saccade  TREATMENT DATE: 12/04/23   -Nustep AA/ROM cardiorespiratory priming and optimization  Seat 7, arms 7, level 2 (increased next time), 5 minutes, SPM 100 - seated BP assessment: Large adult cuff, Left brachial: 147/72 mmHg 72bpm  -standing BP assessment: 136/86 HR 77   -alternate forward and backward AMB in // bars x2 each - addition of horizontal head turns x 2 laps D/B (dizziness rating 4/10)  -alternate Right and Left side stepping in bars x6 each  -Fwd/bwd step over half bolster x 10 leading with each LE  -Lateral step over half bolster x 10 leading with each LE   -airex stance c SPC chest press x15  -airex stance c SPC B shoulder flexion just above eye level x 15  -airex stance head turns x40 -airex stance with forward reaching cone taps at eye level and just above eye level x 20   Sit to stand with 3# DB in each hand 2 x 10 Dynamic high marching in // bars x 3 laps D/B    PATIENT EDUCATION: Education details: exercise technique Person educated: Patient Education  method: Explanation, Demonstration, and Verbal cues,  Education comprehension: verbalized understanding, returned demonstration, and needs further education  HOME EXERCISE PROGRAM: Access Code: [email or web address] URL: [email or web address] Date: 11/26/2023 Prepared by: Reyes London  Exercises - Tandem Stance  - 3 x weekly - 3 sets - 30 hold - Single Leg Stance  - 3 x weekly - 3 sets - 10-20 sec hold  Access Code: [email or web address] URL: [email or web address] Date: 10/03/2023 Prepared by: Connell Kiss  Exercises - Walking  - 1 x daily - 3 x weekly - 1 sets - 3-6 minutes hold - Recumbent Bike  - 1 x daily - 3 x weekly - 1 sets - 6 minutes hold - Seated Scapular Retraction  - 1 x daily - 7 x weekly - 2 sets - 10 reps - Seated Shoulder Shrugs  - 1 x daily - 7 x weekly - 2 sets - 10 reps - Seated Gaze Stabilization with Head Rotation  - 1 x daily - 7 x weekly - 2 sets - 30 seconds hold - Seated Gaze Stabilization with Head Nod  - 1 x daily - 7 x weekly - 2 sets - 30 seconds hold  GOALS: Goals reviewed with patient? Yes, initiated, to continue with further assessment  SHORT TERM GOALS: Target date: 12/17/2023 Patient will be independent in home exercise program to improve balance, dizziness and mobility for increased QOL and ease with ADLs. Baseline: initiated; 6/4: Pt doing HEP every day Goal status: MET  LONG TERM GOALS: Target date: 01/14/2024 Patient will reduce dizziness handicap inventory score to <50, for less dizziness with ADLs and increased safety with home and work tasks. .  Baseline: 09/09/2023: 28;  6/4: 48; 11/19/23: 46   Goal status: ONGOING  2.  Patient will report at least a 30% reduction in dizzy symptoms with provoking movements since start of PT to indicate increased ease with ADLs and mobility. Baseline: horizontal, vertical head movements, bed mobility and other movements currently make pt dizzy; 10/16/22: symptoms unchanged/dizziness with these  activities, but no vertigo episodes; 11/19/23: reports intermittent improvement with vertical and horizontal head movements (sometimes dizzy, sometimes not), still stumbling getting out of bed requiring support  Goal status: ONGOING  3.  Patient will increase dynamic gait index score to at least 23/24 as to demonstrate reduced fall risk and improved dynamic gait balance for better safety with community/home ambulation.  Baseline: 21/24 most difficulty with  head turn activities; 10/16/22: 20/24; 11/19/23: 29/24 Goal status: ONGOING  ASSESSMENT:  CLINICAL IMPRESSION:   Continued with vestibular exercises and balance interventions. She rates her dizziness 4/10 throughout session. Pt takes a few breaks as needed, but denies any specific exacerbation of symptoms during treatment. Pt's episode remain difficult to figure out in terms of triggers. Monique Graham will benefit from further skilled PT to improve dizziness, gait, balance and mobility in order to increase ease and safety with ADLs and QOL.  OBJECTIVE IMPAIRMENTS: Abnormal gait, decreased balance, difficulty walking, dizziness, impaired sensation, improper body mechanics, postural dysfunction, and pain.  ACTIVITY LIMITATIONS: bending, stairs, bed mobility, locomotion level, and activities in general can be impacted/impaired if pt having dizzy episode PARTICIPATION LIMITATIONS: meal prep, cleaning, driving, shopping, community activity, yard work, and ADLs generally impaired it pt having dizzy episode PERSONAL FACTORS: Age, Sex, Time since onset of injury/illness/exacerbation, and 3+ comorbidities: PMH per chart significant for acute kidney failure, atherosclerotic heart disease of native coronary artery without angina pectoris, chronic kidney disease, congestive heart failure, DM, endometrial intraepithelial neoplasia, enlarged heart, folate deficiency anemia, HTN, OSA, osteonecrosis, osteoporosis, vitamin B12 deficiency, Vitamin D deficiency, migraines  please refer to chart for full PMH are also affecting patient's functional outcome.  REHAB POTENTIAL: Good CLINICAL DECISION MAKING: Evolving/moderate complexity EVALUATION COMPLEXITY: Moderate  PLAN:  PT FREQUENCY: 1-2x/week PT DURATION: 8 weeks PLANNED INTERVENTIONS: [id number]- PT Re-evaluation, 97750- Physical Performance Testing, 97110-Therapeutic exercises, 97530- Therapeutic activity, [id number]- Neuromuscular re-education, 97535- Self Care, [id number]- Manual therapy, [id number]- Gait training, [phone]- Orthotic Initial, [phone]- Orthotic/Prosthetic subsequent, [phone]- Canalith repositioning, [id number]- Electrical stimulation (unattended), [phone]- Electrical stimulation (manual), Patient/Family education, Balance training, Stair training, Dry Needling, Joint mobilization, Spinal mobilization, Vestibular training, DME instructions, Cryotherapy, and Moist heat  PLAN FOR NEXT SESSION:     -REVIEW and progress balance HEP - follow-up on recommendations for: - increased daily water intake - set-up with cardiologist for BP management and follow-up pt reports of calcifications in arteries of her neck - recheck orthostatic hypotension if symptomatic  - SBP dropped by 13 on 09/26/2023 - continue cardio conditioning followed by gaze stabilization  - reinforce education on working at 5/10 on Borg RPE scale - advance HEP as indicated - balance interventions - dynamic gait training with turning - continue core strengthening -gaze stabilization, VOR as pt able  Maryanne Finder, PT, DPT Physical Therapist - [facility]  [facility] 1:19 PM, 12/04/23

## 2023-12-09 ENCOUNTER — Ambulatory Visit

## 2023-12-09 DIAGNOSIS — R42 Dizziness and giddiness: Secondary | ICD-10-CM | POA: Diagnosis not present

## 2023-12-09 DIAGNOSIS — R2681 Unsteadiness on feet: Secondary | ICD-10-CM

## 2023-12-09 DIAGNOSIS — M6281 Muscle weakness (generalized): Secondary | ICD-10-CM

## 2023-12-09 DIAGNOSIS — R262 Difficulty in walking, not elsewhere classified: Secondary | ICD-10-CM

## 2023-12-09 DIAGNOSIS — M542 Cervicalgia: Secondary | ICD-10-CM

## 2023-12-09 NOTE — Therapy (Signed)
 OUTPATIENT PHYSICAL THERAPY TREATMENT/DISCHARGE   Patient Name: Monique Graham MRN: [id number] DOB:Oct 01, 1955, 68 y.o., female Today's Date: 12/09/2023  END OF SESSION:   PT End of Session - 12/09/23 1339     Visit Number 19    Number of Visits 30    Date for PT Re-Evaluation 01/14/24    Progress Note Due on Visit 20    PT Start Time 1320    PT Stop Time 1358    PT Time Calculation (min) 38 min    Equipment Utilized During Treatment Gait belt    Activity Tolerance Patient tolerated treatment well;No increased pain    Behavior During Therapy WFL for tasks assessed/performed            Past Medical History:  Diagnosis Date   Acute kidney failure (HCC)    Atherosclerotic heart disease of native coronary artery without angina pectoris    Chronic kidney disease (CKD)    Congestive heart failure (CHF) (HCC)    Coronary atherosclerosis due to calcified coronary lesion    Cyst of left kidney    Diabetes mellitus without complication (HCC)    Diverticulosis of large intestine without perforation or abscess without bleeding    Endometrial intraepithelial neoplasia (EIN)    Enlarged heart    Folate deficiency anemia    Gastric ulcer    Hyperlipidemia    Hypertension    Migraine    Nonerosive esophageal reflux disease    Nonrheumatic mitral (valve) insufficiency    Obstructive sleep apnea    Osteonecrosis (HCC)    Osteoporosis    Other specified disorders of bone density and structure, multiple sites    Ovarian cyst    Polycystic ovary syndrome    Premature ventricular contractions    Restless leg syndrome    Vaginal Pap smear, abnormal    Vitamin B12 deficiency    Vitamin D deficiency    Past Surgical History:  Procedure Laterality Date   ABDOMINAL HYSTERECTOMY     OOPHORECTOMY     Patient Active Problem List   Diagnosis Date Noted   Intermittent palpitations 10/08/2023   Benign hypertension with stage 3a chronic kidney disease (HCC) 10/08/2023    Benign hypertension with CKD (chronic kidney disease) stage III (HCC) 10/04/2023   Class 3 severe obesity due to excess calories with serious comorbidity and body mass index (BMI) of 40.0 to 44.9 in adult 10/04/2023   Hair loss 06/20/2023   Sleep apnea 06/20/2023   Otalgia, left 04/10/2023   Vertigo 04/10/2023   Class 2 obesity due to excess calories with body mass index (BMI) of 38.0 to 38.9 in adult 04/10/2023   Non-seasonal allergic rhinitis 04/10/2023   Class 2 severe obesity due to excess calories with serious comorbidity and body mass index (BMI) of 39.0 to 39.9 in adult (HCC) 03/11/2023   Dyslipidemia due to type 2 diabetes mellitus (HCC) 03/11/2023   Type 2 diabetes mellitus with stage 3a chronic kidney disease, without long-term current use of insulin (HCC) 03/11/2023   Vitamin B12 deficiency 01/30/2023   Cystitis 01/30/2023   Acute right flank pain 01/30/2023   Establishing care with new doctor, encounter for 01/30/2023   Hyperlipidemia LDL goal <100 01/30/2023   Intractable chronic migraine without aura and without status migrainosus 01/30/2023   Hypertension 01/30/2023   Immunization due 01/30/2023    PCP: Petrina Pries, NP REFERRING PROVIDER: Palmer Purchase, PA-C  REFERRING DIAG: vertigo   THERAPY DIAG:  Dizziness and giddiness  Unsteadiness on feet  Difficulty in walking, not elsewhere classified  Muscle weakness (generalized)  Cervicalgia  ONSET DATE: May 2024  Rationale for Evaluation and Treatment: Rehabilitation  SUBJECTIVE:  SUBJECTIVE STATEMENT:   Patient reports she continues to have random vertigo episodes, as well as migraine like symptoms. She reports no flares since here last. Pt feels stable for DC at this time, hope neurology can offer her some answers. Pt has been compliant with HEP handouts and feels t o have made general progress, aside from episodic aggravation.   Pt states she has appointment with Neurologist on August 12th.    PERTINENT  HISTORY:  The pt is a pleasant 68 y/o female presenting to PT for vertigo/dizziness and imbalance. Pt reports onset of dizziness started May last year. She was driving and dizziness began out of nowhere. It lasted for 3 minutes at the time. She reports frequency of dizzy episodes has increased a bit more now since last year. She reports a headache and burning in her throat sensation that typically precedes vertigo. She always gets a headache before or during dizzy episodes.  Pt with hx of migraines. She takes ibuprofen or Tylenol to treat her symptoms. She used to have migraines frequently. She says now headaches seem more sinus-related. Headaches now are more mild and are felt in her sinuses or top of head. She does have ringing in her ears sometimes, sometimes occurs before headache, particularly in R ear, does have pain in L ear.  She reports she has been diagnosed with bilateral hearing loss. She reports her balance is not good and states it is like her equilibrium is off, affects ability to walk up steps, walk around corners. She reports no falls in last 6 months.  She says her legs can hurt at night; this occurred when she took a particular statin. She reports her sleep is not good, she has sleep apnea but has not been using CPAP until yesterday.  PT just moved back to the area and does not have a neurologist here currently. Other information: Pt can have dizziness watching TV, no dizziness looking at her phone, no dizziness in busy stores, no issues with symptoms as passenger in a car. She gets seasick, used to like go fishing, but had to stop.  Pt likes to dance and cook, and she likes to travel.  Treadmills make her dizzy. She goes to the gym, does everything except for treadmill/bikes.   PAIN:  Are you having pain? No  PRECAUTIONS: None and Fall  WEIGHT BEARING RESTRICTIONS: No  FALLS: Has patient fallen in last 6 months? No  LIVING ENVIRONMENT: Lives with: lives with their spouse Stairs:  2 to get into her home no handrails, 12 inside has bilateral handrails Has following equipment at home:  PLOF: Independent  PATIENT GOALS: Get rid of the vertigo   OBJECTIVE:  Note: Objective measures were completed at Evaluation unless otherwise noted.  FUNCTIONAL TESTS:  Dynamic Gait Index: 21/24  PATIENT SURVEYS:  DHI deferred to future visit  VESTIBULAR ASSESSMENT: OCULOMOTOR EXAM:  Ocular Alignment: normal  Ocular ROM: No Limitations  Spontaneous Nystagmus: absent  Gaze-Induced Nystagmus: absent  Smooth Pursuits: intact - does make pt feel dizzy, described as lightheaded   Saccades: catch-up saccade (undershooting) with L gaze, corrective saccade (undershooting) looking up - some symptoms but dizziness not as bas as with pursuits   Convergence/Divergence:  sees double 2 from face, converges, some dizziness (described as light-headed)   VESTIBULAR - OCULAR REFLEX:   Slow VOR: Normal -  makes pt a little dizzy  VOR Cancellation: Normal - makes pt a little dizzy   Head-Impulse Test: Positive to L with corrective saccade - makes pt dizzy but also movement helps headache; positive to R with undershooting and corrective saccade                                             [facility] PT Assessment - 12/09/23 0001       Dynamic Gait Index   Level Surface Normal    Change in Gait Speed Normal    Gait with Horizontal Head Turns Normal    Gait with Vertical Head Turns Normal    Gait and Pivot Turn Normal    Step Over Obstacle Normal    Step Around Obstacles Normal    Steps Mild Impairment    Total Score 23                                                                                           TREATMENT DATE: 12/09/23   Discussion about plan of care, LT goals, prognosis for vertigo symptoms, FU with neurology in [place]. DGI DHI HEP review   PATIENT EDUCATION: Education details: exercise technique Person educated: Patient Education method: Explanation, Demonstration,  and Verbal cues,  Education comprehension: verbalized understanding, returned demonstration, and needs further education  HOME EXERCISE PROGRAM: Access Code: [email or web address] URL: [email or web address] Date: 11/26/2023 Prepared by: Reyes London  Exercises - Tandem Stance  - 3 x weekly - 3 sets - 30 hold - Single Leg Stance  - 3 x weekly - 3 sets - 10-20 sec hold  Access Code: [email or web address] URL: [email or web address] Date: 10/03/2023 Prepared by: Connell Kiss  Exercises - Walking  - 1 x daily - 3 x weekly - 1 sets - 3-6 minutes hold - Recumbent Bike  - 1 x daily - 3 x weekly - 1 sets - 6 minutes hold - Seated Scapular Retraction  - 1 x daily - 7 x weekly - 2 sets - 10 reps - Seated Shoulder Shrugs  - 1 x daily - 7 x weekly - 2 sets - 10 reps - Seated Gaze Stabilization with Head Rotation  - 1 x daily - 7 x weekly - 2 sets - 30 seconds hold - Seated Gaze Stabilization with Head Nod  - 1 x daily - 7 x weekly - 2 sets - 30 seconds hold  GOALS: Goals reviewed with patient? Yes, initiated, to continue with further assessment  SHORT TERM GOALS: Target date: 12/17/2023 Patient will be independent in home exercise program to improve balance, dizziness and mobility for increased QOL and ease with ADLs. Baseline: initiated; 6/4: Pt doing HEP every day Goal status: MET  LONG TERM GOALS: Target date: 01/14/2024 Patient will reduce dizziness handicap inventory score to <50, for less dizziness with ADLs and increased safety with home and work tasks.  Baseline: 09/09/2023: 28;  6/4: 48; 11/19/23: 46; 12/09/23: 34/100 (34%)   Goal status: MET   2.  Patient will report at least  a 30% reduction in dizzy symptoms with provoking movements since start of PT to indicate increased ease with ADLs and mobility. Baseline: horizontal, vertical head movements, bed mobility and other movements currently make pt dizzy; 10/16/22: symptoms unchanged/dizziness with these activities, but no vertigo  episodes; 11/19/23: reports intermittent improvement with vertical and horizontal head movements (sometimes dizzy, sometimes not), still stumbling getting out of bed requiring support; 7/28: is better than baseline outside of when she is having acute attacks.  Goal status: MET   3.  Patient will increase dynamic gait index score to at least 23/24 as to demonstrate reduced fall risk and improved dynamic gait balance for better safety with community/home ambulation.  Baseline: 21/24 most difficulty with head turn activities; 10/16/22: 20/24; 11/19/23: 19/24; 12/09/23: 23/24  Goal status: MET  ASSESSMENT:  CLINICAL IMPRESSION:   LT goals assessed. Pt has met all LT goals of care at this time, but continues to have insidious vertigo episodes and migraine like symptoms. Pt to establish care with neurology on 8/12, is confident with DC from PT at this time, but will call us  after neurology visit or return for FU as needed. No additional PT needed at this time.   OBJECTIVE IMPAIRMENTS: Abnormal gait, decreased balance, difficulty walking, dizziness, impaired sensation, improper body mechanics, postural dysfunction, and pain.  ACTIVITY LIMITATIONS: bending, stairs, bed mobility, locomotion level, and activities in general can be impacted/impaired if pt having dizzy episode PARTICIPATION LIMITATIONS: meal prep, cleaning, driving, shopping, community activity, yard work, and ADLs generally impaired it pt having dizzy episode PERSONAL FACTORS: Age, Sex, Time since onset of injury/illness/exacerbation, and 3+ comorbidities: PMH per chart significant for acute kidney failure, atherosclerotic heart disease of native coronary artery without angina pectoris, chronic kidney disease, congestive heart failure, DM, endometrial intraepithelial neoplasia, enlarged heart, folate deficiency anemia, HTN, OSA, osteonecrosis, osteoporosis, vitamin B12 deficiency, Vitamin D deficiency, migraines please refer to chart for full PMH are  also affecting patient's functional outcome.  REHAB POTENTIAL: Good CLINICAL DECISION MAKING: Evolving/moderate complexity EVALUATION COMPLEXITY: Moderate  PLAN:  PT FREQUENCY: 1-2x/week PT DURATION: 8 weeks PLANNED INTERVENTIONS: 97164- PT Re-evaluation, 97750- Physical Performance Testing, 97110-Therapeutic exercises, 97530- Therapeutic activity, 97112- Neuromuscular re-education, 97535- Self Care, [id number]- Manual therapy, [phone]- Gait training, [phone]- Orthotic Initial, [phone]- Orthotic/Prosthetic subsequent, [phone]- Canalith repositioning, [id number]- Electrical stimulation (unattended), [phone]- Electrical stimulation (manual), Patient/Family education, Balance training, Stair training, Dry Needling, Joint mobilization, Spinal mobilization, Vestibular training, DME instructions, Cryotherapy, and Moist heat   2:07 PM, 12/09/23 Peggye JAYSON Linear, PT, DPT Physical Therapist - [facility]  Outpatient Physical Therapy- Main Campus [phone]    -

## 2023-12-11 ENCOUNTER — Ambulatory Visit

## 2023-12-16 ENCOUNTER — Ambulatory Visit

## 2023-12-18 ENCOUNTER — Telehealth: Admitting: Physician Assistant

## 2023-12-18 ENCOUNTER — Ambulatory Visit

## 2023-12-18 DIAGNOSIS — J069 Acute upper respiratory infection, unspecified: Secondary | ICD-10-CM

## 2023-12-18 MED ORDER — BENZONATATE 200 MG PO CAPS: 200.0000 mg | ORAL_CAPSULE | Freq: Two times a day (BID) | ORAL | 0 refills | Status: DC | PRN

## 2023-12-18 MED ORDER — MOMETASONE FUROATE 50 MCG/ACT NA SUSP: 2.0000 | Freq: Every day | NASAL | 0 refills | Status: DC

## 2023-12-18 NOTE — Progress Notes (Signed)

## 2023-12-23 ENCOUNTER — Ambulatory Visit: Admitting: Internal Medicine

## 2023-12-23 ENCOUNTER — Telehealth: Payer: Self-pay

## 2023-12-23 ENCOUNTER — Ambulatory Visit

## 2023-12-23 NOTE — Telephone Encounter (Signed)
 I called patient twice to reschedule patient's appointment on 8/15 as the provider will  not be in the office. I left pt vm to call the office to reschedule. YL,RMA

## 2023-12-24 ENCOUNTER — Ambulatory Visit (INDEPENDENT_AMBULATORY_CARE_PROVIDER_SITE_OTHER): Admitting: Neurology

## 2023-12-24 ENCOUNTER — Encounter: Payer: Self-pay | Admitting: Neurology

## 2023-12-24 VITALS — BP 154/97 | HR 95 | Ht 62.5 in | Wt 232.8 lb

## 2023-12-24 DIAGNOSIS — G43809 Other migraine, not intractable, without status migrainosus: Secondary | ICD-10-CM | POA: Insufficient documentation

## 2023-12-24 DIAGNOSIS — G43709 Chronic migraine without aura, not intractable, without status migrainosus: Secondary | ICD-10-CM | POA: Diagnosis not present

## 2023-12-24 DIAGNOSIS — G43109 Migraine with aura, not intractable, without status migrainosus: Secondary | ICD-10-CM | POA: Diagnosis not present

## 2023-12-24 MED ORDER — TOPIRAMATE 50 MG PO TABS: 50.0000 mg | ORAL_TABLET | Freq: Every day | ORAL | 4 refills | Status: AC

## 2023-12-24 NOTE — Progress Notes (Signed)
 [facility]    Provider:  Dr Ines Requesting Provider: Palmer Purchase, PA-C Primary Care Provider:  Petrina Pries, NP  CC:  migraines  HPI:  Monique Graham is a 68 y.o. female here as requested by Palmer Purchase, PA-C for migraines. has Vitamin B12 deficiency; Cystitis; Acute right flank pain; Establishing care with new doctor, encounter for; Hyperlipidemia LDL goal <100; Intractable chronic migraine without aura and without status migrainosus; Hypertension; Immunization due; Class 2 severe obesity due to excess calories with serious comorbidity and body mass index (BMI) of 39.0 to 39.9 in adult [facility]); Dyslipidemia due to type 2 diabetes mellitus (HCC); Type 2 diabetes mellitus with stage 3a chronic kidney disease, without long-term current use of insulin (HCC); Otalgia, left; Vertigo; Class 2 obesity due to excess calories with body mass index (BMI) of 38.0 to 38.9 in adult; Non-seasonal allergic rhinitis; Hair loss; Sleep apnea; Benign hypertension with CKD (chronic kidney disease) stage III (HCC); Class 3 severe obesity due to excess calories with serious comorbidity and body mass index (BMI) of 40.0 to 44.9 in adult; Intermittent palpitations; Benign hypertension with stage 3a chronic kidney disease (HCC); Chronic migraine without aura without status migrainosus, not intractable; Vestibular migraine; and Migraine with vertigo on their problem list.   Headaches started last year, she was in [place]  and she had vertigo and headache and burning in the throat. She went to urgent care and the ER. Since then she has been having vertigo and headaches, went to vestibular rehab last 12/09/2023. She has a history of migraines took topiramate  and that worked well. She has a cpap for her sleep apnea, she uses it as often as she can but she has upper respiratory infections she has a sleep doctor and is seeing Dr. Jess and has seen her sleep doctor and working with mask and  settings. She can wake up with headaches, she went to an ENT for her sinuses and gave her flonase , daily headaches, they are on the top of the head and can spread to the back of the head, pressure, can be throbbing, has light and sound sensitivity, vertigo can be associated with the headache and a burning sensation int he throat, room spinning, nausea, no vomiting, hurt to move, laying down helps, closing eyes help, no vision changes, she saw an ophthalmologist and an eye doctor in the last year and no changes same prescription, for the last > 3 months daily headaches and has one right now.  She has moderate migraines 8 days a month, they can last all day long, no medication overuse.   Reviewed notes, labs and imaging from outside physicians, which showed   06/20/2023: Vitamin B12 722 Hgba1c 5.7 Cmp with leevated creat 1.07 otherwise unremarkable Cbc essentially normal Tsh nml 10/04/2023: hgba1c 6.0  MRi brain 2013: reviewed report, images unavailable IMPRESSION:  1. I do not see evidence of an acute ischemic event within the limits of  the  distorted diffusion-weighted sequences.  2. There is no evidence of hydrocephalus nor intracranial mass effect.  3. Extensive white matter signal abnormalities are consistent with chronic  small vessel ischemia. Signal within the pons and medulla is within the  limits of normal.  From a thorough review of records and patient report, Medications tried that can be used in migraine/headache management greater than 3 months include: Lifestyle modification, headache diaries, better sleep hygiene, exercise, management of migraine triggers, OTC and prescribed analgesics/nsaids such as ibuprofen, excedrin, alleve and others, metoprolol , nurtec, ubrelvy , topiramate , elavil/amitriptyline.  03/14/2023: CLINICAL DATA:  Headache, new onset (Age >= 51y)   EXAM: CT HEAD WITHOUT CONTRAST reviewed images and agree   TECHNIQUE: Contiguous axial images were obtained  from the base of the skull through the vertex without intravenous contrast.   RADIATION DOSE REDUCTION: This exam was performed according to the departmental dose-optimization program which includes automated exposure control, adjustment of the mA and/or kV according to patient size and/or use of iterative reconstruction technique.   COMPARISON:  CT head Oct 01, 2011.   FINDINGS: Brain: No evidence of acute infarction, hemorrhage, hydrocephalus, extra-axial collection or mass lesion/mass effect. Similar patchy white matter hypodensities are nonspecific but compatible with chronic microvascular ischemic disease.   Vascular: No hyperdense vessel.   Skull: No acute fracture.   Sinuses/Orbits: Clear sinuses.  No acute orbital findings.   Other: No mastoid effusions.   IMPRESSION: No evidence of acute intracranial abnormality.    Review of Systems: Patient complains of symptoms per HPI as well as the following symptoms none. Pertinent negatives and positives per HPI. All others negative.   Social History   Socioeconomic History   Marital status: Married    Spouse name: Not on file   Number of children: 0   Years of education: Not on file   Highest education level: Professional school degree (e.g., MD, DDS, DVM, JD)  Occupational History   Not on file  Tobacco Use   Smoking status: Former    Current packs/day: 0.00    Types: Cigarettes    Quit date: 1989    Years since quitting: 36.6    Passive exposure: Never   Smokeless tobacco: Never   Tobacco comments:    Started smoking at 68 yrs old.    Smoked 1PPD at her heaviest.    Quit smoking in 1989- khj 08/26/2023  Vaping Use   Vaping status: Never Used  Substance and Sexual Activity   Alcohol use: Yes    Comment: RARELY   Drug use: Never   Sexual activity: Yes  Other Topics Concern   Not on file  Social History Narrative   Caffiene every now and then.    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    Social  Drivers of Corporate investment banker Strain: Low Risk  (04/02/2023)   Overall Financial Resource Strain (CARDIA)    Difficulty of Paying Living Expenses: Not very hard  Food Insecurity: No Food Insecurity (04/02/2023)   Hunger Vital Sign    Worried About Running Out of Food in the Last Year: Never true    Ran Out of Food in the Last Year: Never true  Transportation Needs: No Transportation Needs (04/02/2023)   PRAPARE - Administrator, Civil Service (Medical): No    Lack of Transportation (Non-Medical): No  Physical Activity: Insufficiently Active (04/02/2023)   Exercise Vital Sign    Days of Exercise per Week: 2 days    Minutes of Exercise per Session: 30 min  Stress: No Stress Concern Present (04/02/2023)   [employer] of Occupational Health - Occupational Stress Questionnaire    Feeling of Stress : Only a little  Social Connections: Unknown (04/02/2023)   Social Connection and Isolation Panel    Frequency of Communication with Friends and Family: Twice a week    Frequency of Social Gatherings with Friends and Family: Patient declined    Attends Religious Services: More than 4 times per year    Active Member of Clubs or Organizations: Yes  Attends Banker Meetings: Patient declined    Marital Status: Separated  Recent Concern: Social Connections - Moderately Isolated (03/01/2023)   Social Connection and Isolation Panel    Frequency of Communication with Friends and Family: More than three times a week    Frequency of Social Gatherings with Friends and Family: Patient unable to answer    Attends Religious Services: 1 to 4 times per year    Active Member of [employer] or Organizations: No    Attends Banker Meetings: Never    Marital Status: Separated  Intimate Partner Violence: Not At Risk (03/01/2023)   Humiliation, Afraid, Rape, and Kick questionnaire    Fear of Current or Ex-Partner: No    Emotionally Abused: No    Physically  Abused: No    Sexually Abused: No    Family History  Problem Relation Age of Onset   Cancer Mother    Hypertension Mother    Diabetes Mother    Heart disease Mother    Breast cancer Mother    Diabetes Father    Cancer Sister    Breast cancer Sister    Breast cancer Cousin    ALS Cousin    Migraines Neg Hx     Past Medical History:  Diagnosis Date   Acute kidney failure (HCC)    Atherosclerotic heart disease of native coronary artery without angina pectoris    Chronic kidney disease (CKD)    Congestive heart failure (CHF) (HCC)    Coronary atherosclerosis due to calcified coronary lesion    Cyst of left kidney    Diabetes mellitus without complication (HCC)    Diverticulosis of large intestine without perforation or abscess without bleeding    Endometrial intraepithelial neoplasia (EIN)    Enlarged heart    Folate deficiency anemia    Gastric ulcer    Hyperlipidemia    Hypertension    Migraine    Nonerosive esophageal reflux disease    Nonrheumatic mitral (valve) insufficiency    Obstructive sleep apnea    Osteonecrosis (HCC)    Osteoporosis    Other specified disorders of bone density and structure, multiple sites    Ovarian cyst    Polycystic ovary syndrome    Premature ventricular contractions    Restless leg syndrome    Vaginal Pap smear, abnormal    Vitamin B12 deficiency    Vitamin D deficiency     Patient Active Problem List   Diagnosis Date Noted   Chronic migraine without aura without status migrainosus, not intractable 12/24/2023   Vestibular migraine 12/24/2023   Migraine with vertigo 12/24/2023   Intermittent palpitations 10/08/2023   Benign hypertension with stage 3a chronic kidney disease (HCC) 10/08/2023   Benign hypertension with CKD (chronic kidney disease) stage III (HCC) 10/04/2023   Class 3 severe obesity due to excess calories with serious comorbidity and body mass index (BMI) of 40.0 to 44.9 in adult 10/04/2023   Hair loss 06/20/2023    Sleep apnea 06/20/2023   Otalgia, left 04/10/2023   Vertigo 04/10/2023   Class 2 obesity due to excess calories with body mass index (BMI) of 38.0 to 38.9 in adult 04/10/2023   Non-seasonal allergic rhinitis 04/10/2023   Class 2 severe obesity due to excess calories with serious comorbidity and body mass index (BMI) of 39.0 to 39.9 in adult [facility]) 03/11/2023   Dyslipidemia due to type 2 diabetes mellitus (HCC) 03/11/2023   Type 2 diabetes mellitus with stage 3a chronic kidney disease, without  long-term current use of insulin (HCC) 03/11/2023   Vitamin B12 deficiency 01/30/2023   Cystitis 01/30/2023   Acute right flank pain 01/30/2023   Establishing care with new doctor, encounter for 01/30/2023   Hyperlipidemia LDL goal <100 01/30/2023   Intractable chronic migraine without aura and without status migrainosus 01/30/2023   Hypertension 01/30/2023   Immunization due 01/30/2023    Past Surgical History:  Procedure Laterality Date   ABDOMINAL HYSTERECTOMY     OOPHORECTOMY      Current Outpatient Medications  Medication Sig Dispense Refill   aspirin EC 81 MG tablet Take 81 mg by mouth daily.     azelastine  (ASTELIN ) 0.1 % nasal spray Place 2 sprays into both nostrils 2 (two) times daily. Use in each nostril as directed 30 mL 12   benzonatate  (TESSALON ) 200 MG capsule Take 1 capsule (200 mg total) by mouth 2 (two) times daily as needed for cough. 20 capsule 0   cloNIDine  (CATAPRES ) 0.1 MG tablet Take 1 tablet (0.1 mg total) by mouth 2 (two) times daily. 180 tablet 1   desloratadine  (CLARINEX ) 5 MG tablet TAKE 1 TABLET (5 MG TOTAL) BY MOUTH DAILY. 90 tablet 3   diclofenac  Sodium (VOLTAREN ) 1 % GEL Apply 2 g topically daily as needed. 100 g 1   estradiol  (ESTRACE  VAGINAL) 0.1 MG/GM vaginal cream 1 applicatorful (1 gram) per vagina every evening for 2 weeks then once or twice weekly for maintenance 42.5 g 3   ezetimibe  (ZETIA ) 10 MG tablet Take 1 tablet (10 mg total) by mouth daily. 30 tablet  11   fluticasone  (FLONASE ) 50 MCG/ACT nasal spray Place 2 sprays into both nostrils daily. 16 g 6   Folic Acid (FOLATE PO) Take 666 mcg by mouth daily at 12 noon.     meclizine  (ANTIVERT ) 25 MG tablet Take 1 tablet (25 mg total) by mouth 3 (three) times daily as needed for dizziness. 30 tablet 1   metoprolol  tartrate (LOPRESSOR ) 50 MG tablet Take 1 tablet (50 mg total) by mouth 2 (two) times daily. 180 tablet 1   olmesartan  (BENICAR ) 40 MG tablet Take 1 tablet (40 mg total) by mouth daily. 90 tablet 2   OZEMPIC , 1 MG/DOSE, 4 MG/3ML SOPN INJECT 1 MG INTO THE SKIN ONE TIME PER WEEK 3 mL 2   topiramate  (TOPAMAX ) 50 MG tablet Take 1 tablet (50 mg total) by mouth at bedtime. 90 tablet 4   allopurinol (ZYLOPRIM) 100 MG tablet Take 100 mg by mouth daily. (Patient not taking: Reported on 12/24/2023)     Evolocumab  (REPATHA  SURECLICK) 140 MG/ML SOAJ Inject 140 mg into the skin every 14 (fourteen) days. (Patient not taking: Reported on 12/24/2023) 6 mL 1   mometasone  (NASONEX ) 50 MCG/ACT nasal spray Place 2 sprays into the nose daily. (Patient not taking: Reported on 12/24/2023) 17 g 0   No current facility-administered medications for this visit.    Allergies as of 12/24/2023 - Review Complete 12/24/2023  Allergen Reaction Noted   Oxycodone-acetaminophen Anaphylaxis and Other (See Comments) 03/20/2015   Latex Rash 12/20/2020   Lactose Diarrhea 05/19/2018   Statins Other (See Comments) 12/24/2023   Sulfa antibiotics Other (See Comments) 04/29/2020    Vitals: BP (!) 154/97 (BP Location: Right Arm, Patient Position: Sitting, Cuff Size: Large)   Pulse 95   Ht 5' 2.5 (1.588 m)   Wt 232 lb 12.8 oz (105.6 kg)   LMP  (LMP Unknown)   BMI 41.90 kg/m  Last Weight:  Wt Readings from  Last 1 Encounters:  12/24/23 232 lb 12.8 oz (105.6 kg)   Last Height:   Ht Readings from Last 1 Encounters:  12/24/23 5' 2.5 (1.588 m)     Physical exam: Exam: Gen: NAD, conversant, well nourised, obese, well  groomed                     CV: RRR, no MRG. No Carotid Bruits. No peripheral edema, warm, nontender Eyes: Conjunctivae clear without exudates or hemorrhage  Neuro: Detailed Neurologic Exam  Speech:    Speech is normal; fluent and spontaneous with normal comprehension.  Cognition:    The patient is oriented to person, place, and time;     recent and remote memory intact;     language fluent;     normal attention, concentration,     fund of knowledge Cranial Nerves:    The pupils are equal, round, and reactive to light. The fundi are normal and spontaneous venous pulsations are present. Visual fields are full to finger confrontation. Extraocular movements are intact. Trigeminal sensation is intact and the muscles of mastication are normal. The face is symmetric. The palate elevates in the midline. Hearing intact. Voice is normal. Shoulder shrug is normal. The tongue has normal motion without fasciculations.   Coordination: normal  Gait: normal  Motor Observation:    No asymmetry, no atrophy, and no involuntary movements noted. Tone:    Normal muscle tone.    Posture:    Posture is normal. normal erect    Strength:    Strength is V/V in the upper and lower limbs.      Sensation: intact to LT     Reflex Exam:  DTR's:    Deep tendon reflexes in the upper and lower extremities are symmetrical bilaterally.   Toes:    The toes are downgoing bilaterally.   Clonus:    Clonus is absent.    Assessment/Plan:  patient with chronic Migraines with vertigo  Untreated sleep apnea can cause daily headaches, she is working with a sleep doctor She did very well on topiramate  in the past, she would like to go back on it. We also discussed the new medication class Ajovy, Emgality If need to change from topiramate , would try Ajovy or Emgality When her migraines improve, we can try Nurtec/ubrelvy   Chronic microvescular ischemia: control vascular risk factors, discussed(in 2013  mri)  Meds ordered this encounter  Medications   topiramate  (TOPAMAX ) 50 MG tablet    Sig: Take 1 tablet (50 mg total) by mouth at bedtime.    Dispense:  90 tablet    Refill:  4    Cc: Hedges, Reyes RIGGERS,  Petrina Pries, NP  Onetha Epp, MD  [facility] [address] [place], [place] [address]  Phone [phone] Fax [phone]

## 2023-12-24 NOTE — Patient Instructions (Addendum)
 Prevention: Can retry Topiramate . Next I would try the new class of medications Ajovy or Emgality or Qulipta   Topiramate  Tablets What is this medication? TOPIRAMATE  (toe PYRE a mate) prevents and controls seizures in people with epilepsy. It may also be used to prevent migraine headaches. It works by calming overactive nerves in your body. This medicine may be used for other purposes; ask your health care provider or pharmacist if you have questions. COMMON BRAND NAME(S): Topamax , Topiragen What should I tell my care team before I take this medication? They need to know if you have any of these conditions: Bleeding disorder Kidney disease Lung disease Suicidal thoughts, plans, or attempt by you or a family member An unusual or allergic reaction to topiramate , other medications, foods, dyes, or preservatives Pregnant or trying to get pregnant Breast-feeding How should I use this medication? Take this medication by mouth with water. Take it as directed on the prescription label at the same time every day. Do not cut, crush or chew this medicine. Swallow the tablets whole. You can take it with or without food. If it upsets your stomach, take it with food. Keep taking it unless your care team tells you to stop. A special MedGuide will be given to you by the pharmacist with each prescription and refill. Be sure to read this information carefully each time. Talk to your care team about the use of this medication in children. While it may be prescribed for children as young as 2 years for selected conditions, precautions do apply. Overdosage: If you think you have taken too much of this medicine contact a poison control center or emergency room at once. NOTE: This medicine is only for you. Do not share this medicine with others. What if I miss a dose? If you miss a dose, take it as soon as you can unless it is within 6 hours of the next dose. If it is within 6 hours of the next dose, skip the missed  dose. Take the next dose at the normal time. Do not take double or extra doses. What may interact with this medication? Acetazolamide Alcohol Antihistamines for allergy, cough, and cold Aspirin and aspirin-like medications Atropine Certain medications for anxiety or sleep Certain medications for bladder problems, such as oxybutynin, tolterodine Certain medications for depression, such as amitriptyline, fluoxetine, sertraline Certain medications for Parkinson disease, such as benztropine, trihexyphenidyl Certain medications for seizures, such as carbamazepine, lamotrigine, phenobarbital, phenytoin, primidone, valproic acid, zonisamide Certain medications for stomach problems, such as dicyclomine, hyoscyamine Certain medications for travel sickness, such as scopolamine Certain medications that treat or prevent blood clots, such as warfarin, enoxaparin, dalteparin, apixaban, dabigatran, rivaroxaban Digoxin Diltiazem Estrogen and progestin hormones General anesthetics, such as halothane, isoflurane, methoxyflurane, propofol Glyburide Hydrochlorothiazide Ipratropium Lithium Medications that relax muscles Metformin NSAIDs, medications for pain and inflammation, such as ibuprofen or naproxen Opioid medications for pain Phenothiazines, such as chlorpromazine, mesoridazine, prochlorperazine, thioridazine Pioglitazone This list may not describe all possible interactions. Give your health care provider a list of all the medicines, herbs, non-prescription drugs, or dietary supplements you use. Also tell them if you smoke, drink alcohol, or use illegal drugs. Some items may interact with your medicine. What should I watch for while using this medication? Visit your care team for regular checks on your progress. Tell your care team if your symptoms do not start to get better or if they get worse. Do not suddenly stop taking this medication. You may develop a severe reaction. Your  care team will  tell you how much medication to take. If your care team wants you to stop the medication, the dose may be slowly lowered over time to avoid any side effects. Wear a medical ID bracelet or chain. Carry a card that describes your condition. List the medications and doses you take on the card. This medication may affect your coordination, reaction time, or judgment. Do not drive or operate machinery until you know how this medication affects you. Sit up or stand slowly to reduce the risk of dizzy or fainting spells. Drinking alcohol with this medication can increase the risk of these side effects. This medication may cause serious skin reactions. They can happen weeks to months after starting the medication. Contact your care team right away if you notice fevers or flu-like symptoms with a rash. The rash may be red or purple and then turn into blisters or peeling of the skin. You may also notice a red rash with swelling of the face, lips, or lymph nodes in your neck or under your arms. This medication may cause thoughts of suicide or depression. This includes sudden changes in mood, behaviors, or thoughts. These changes can happen at any time but are more common in the beginning of treatment or after a change in dose. Call your care team right away if you experience these thoughts or worsening depression. This medication may slow your child's growth if it is taken for a long time at high doses. Your child's care team will monitor your child's growth. Using this medication for a long time may weaken your bones. The risk of bone fractures may be increased. Talk to your care team about your bone health. Discuss this medication with your care team if you may be pregnant. Serious birth defects can occur if you take this medication during pregnancy. There are benefits and risks to taking medications during pregnancy. Your care team can help you find the option that works for you. Contraception is recommended while  taking this medication. Estrogen and progestin hormones may not work as well while you are taking this medication. Your care team can help you find the option that works for you. Talk to your care team before breastfeeding. Changes to your treatment plan may be needed. What side effects may I notice from receiving this medication? Side effects that you should report to your care team as soon as possible: Allergic reactions--skin rash, itching, hives, swelling of the face, lips, tongue, or throat High acid level--trouble breathing, unusual weakness or fatigue, confusion, headache, fast or irregular heartbeat, nausea, vomiting High ammonia level--unusual weakness or fatigue, confusion, loss of appetite, nausea, vomiting, seizures Fever that does not go away, decrease in sweat Kidney stones--blood in the urine, pain or trouble passing urine, pain in the lower back or sides Redness, blistering, peeling or loosening of the skin, including inside the mouth Sudden eye pain or change in vision such as blurry vision, seeing halos around lights, vision loss Thoughts of suicide or self-harm, worsening mood, feelings of depression Side effects that usually do not require medical attention (report to your care team if they continue or are bothersome): Burning or tingling sensation in hands or feet Difficulty with paying attention, memory, or speech Dizziness Drowsiness Fatigue Loss of appetite with weight loss Slow or sluggish movements of the body This list may not describe all possible side effects. Call your doctor for medical advice about side effects. You may report side effects to FDA at 1-800-FDA-1088. Where should  I keep my medication? Keep out of the reach of children and pets. Store between 15 and 30 degrees C (59 and 86 degrees F). Protect from moisture. Keep the container tightly closed. Get rid of any unused medication after the expiration date. To get rid of medications that are no longer  needed or have expired: Take the medication to a medication take-back program. Check with your pharmacy or law enforcement to find a location. If you cannot return the medication, check the label or package insert to see if the medication should be thrown out in the garbage or flushed down the toilet. If you are not sure, ask your care team. If it is safe to put it in the trash, empty the medication out of the container. Mix the medication with cat litter, dirt, coffee grounds, or other unwanted substance. Seal the mixture in a bag or container. Put it in the trash. NOTE: This sheet is a summary. It may not cover all possible information. If you have questions about this medicine, talk to your doctor, pharmacist, or health care provider.  2024 Elsevier/Gold Standard (2021-09-21 00:00:00)

## 2023-12-25 ENCOUNTER — Ambulatory Visit

## 2023-12-27 ENCOUNTER — Ambulatory Visit: Admitting: Family Medicine

## 2023-12-31 ENCOUNTER — Ambulatory Visit

## 2024-01-02 ENCOUNTER — Ambulatory Visit

## 2024-01-07 ENCOUNTER — Ambulatory Visit

## 2024-01-09 ENCOUNTER — Ambulatory Visit

## 2024-01-14 ENCOUNTER — Ambulatory Visit

## 2024-01-16 ENCOUNTER — Ambulatory Visit

## 2024-01-21 ENCOUNTER — Ambulatory Visit

## 2024-01-22 ENCOUNTER — Ambulatory Visit: Admitting: Sleep Medicine

## 2024-01-22 VITALS — BP 128/76 | HR 98 | Temp 97.6°F | Wt 232.2 lb

## 2024-01-22 DIAGNOSIS — Z87891 Personal history of nicotine dependence: Secondary | ICD-10-CM | POA: Diagnosis not present

## 2024-01-22 DIAGNOSIS — I10 Essential (primary) hypertension: Secondary | ICD-10-CM

## 2024-01-22 DIAGNOSIS — Z6841 Body Mass Index (BMI) 40.0 and over, adult: Secondary | ICD-10-CM | POA: Diagnosis not present

## 2024-01-22 DIAGNOSIS — G4733 Obstructive sleep apnea (adult) (pediatric): Secondary | ICD-10-CM

## 2024-01-22 NOTE — Patient Instructions (Addendum)

## 2024-01-22 NOTE — Progress Notes (Signed)
 Name:Monique Graham MRN: [id number] DOB: 1955-06-28   CHIEF COMPLAINT:  CPAP F/U   HISTORY OF PRESENT ILLNESS:  Monique Graham is a 68 y.o. w/ a h/o OSA, HTN and morbid obesity who presents for CPAP F/U visit. Reports difficulty using CPAP therapy due to mask leaks. Also reports recovering from a URI recently and has significant nasal congestion. She is currently using the Airfit N30i nasal mask.     EPWORTH SLEEP SCORE    08/26/2023    9:00 AM  Results of the Epworth flowsheet  Sitting and reading 2  Watching TV 0  Sitting, inactive in a public place (e.g. a theatre or a meeting) 1  As a passenger in a car for an hour without a break 0  Lying down to rest in the afternoon when circumstances permit 1  Sitting and talking to someone 0  Sitting quietly after a lunch without alcohol 1  In a car, while stopped for a few minutes in traffic 0  Total score 5    PAST MEDICAL HISTORY :   has a past medical history of Acute kidney failure (HCC), Atherosclerotic heart disease of native coronary artery without angina pectoris, Chronic kidney disease (CKD), Congestive heart failure (CHF) (HCC), Coronary atherosclerosis due to calcified coronary lesion, Cyst of left kidney, Diabetes mellitus without complication (HCC), Diverticulosis of large intestine without perforation or abscess without bleeding, Endometrial intraepithelial neoplasia (EIN), Enlarged heart, Folate deficiency anemia, Gastric ulcer, Hyperlipidemia, Hypertension, Migraine, Nonerosive esophageal reflux disease, Nonrheumatic mitral (valve) insufficiency, Obstructive sleep apnea, Osteonecrosis (HCC), Osteoporosis, Other specified disorders of bone density and structure, multiple sites, Ovarian cyst, Polycystic ovary syndrome, Premature ventricular contractions, Restless leg syndrome, Vaginal Pap smear, abnormal, Vitamin B12 deficiency, and Vitamin D deficiency.  has a past surgical history that includes Abdominal  hysterectomy and Oophorectomy. Prior to Admission medications   Medication Sig Start Date End Date Taking? Authorizing Provider  aspirin EC 81 MG tablet Take 81 mg by mouth daily.   Yes [provider]  azelastine  (ASTELIN ) 0.1 % nasal spray Place 2 sprays into both nostrils 2 (two) times daily. Use in each nostril as directed 05/23/23  Yes Hedges, Reyes, PA-C  cloNIDine  (CATAPRES ) 0.1 MG tablet Take 1 tablet (0.1 mg total) by mouth 2 (two) times daily. 10/04/23  Yes Petrina Pries, NP  desloratadine  (CLARINEX ) 5 MG tablet TAKE 1 TABLET (5 MG TOTAL) BY MOUTH DAILY. 09/25/23 09/24/24 Yes Tobie Comp B, MD  diclofenac  Sodium (VOLTAREN ) 1 % GEL Apply 2 g topically daily as needed. 03/12/23  Yes Petrina Pries, NP  Evolocumab  (REPATHA  SURECLICK) 140 MG/ML SOAJ Inject 140 mg into the skin every 14 (fourteen) days. 10/04/23  Yes Petrina Pries, NP  ezetimibe  (ZETIA ) 10 MG tablet Take 1 tablet (10 mg total) by mouth daily. 04/03/23 04/02/24 Yes Petrina Pries, NP  fluticasone  (FLONASE ) 50 MCG/ACT nasal spray Place 2 sprays into both nostrils daily. 08/12/23  Yes Hedges, Reyes, PA-C  Folic Acid (FOLATE PO) Take 666 mcg by mouth daily at 12 noon.   Yes [provider]  meclizine  (ANTIVERT ) 25 MG tablet Take 1 tablet (25 mg total) by mouth 3 (three) times daily as needed for dizziness. 05/23/23  Yes Hedges, Reyes, PA-C  metoprolol  tartrate (LOPRESSOR ) 50 MG tablet Take 1 tablet (50 mg total) by mouth 2 (two) times daily. 10/04/23  Yes Petrina Pries, NP  olmesartan  (BENICAR ) 40 MG tablet Take 1 tablet (40 mg total) by mouth daily. 10/04/23  Yes  Petrina Pries, NP  OZEMPIC , 1 MG/DOSE, 4 MG/3ML SOPN INJECT 1 MG INTO THE SKIN ONE TIME PER WEEK 09/05/23  Yes Petrina Pries, NP  allopurinol (ZYLOPRIM) 100 MG tablet Take 100 mg by mouth daily. Patient not taking: Reported on 10/28/2023 05/31/21   [provider]  Rimegepant Sulfate (NURTEC) 75 MG TBDP Take 1 tablet (75 mg total) by mouth every other  day. Patient not taking: Reported on 10/28/2023 04/03/23 10/28/23  Petrina Pries, NP   Allergies  Allergen Reactions   Oxycodone-Acetaminophen Anaphylaxis and Other (See Comments)   Latex Rash   Lactose Diarrhea   Statins Other (See Comments)    When lies down has nerve pain/ with taking statins.    Sulfa Antibiotics Other (See Comments)    FAMILY HISTORY:  family history includes ALS in her cousin; Breast cancer in her cousin, mother, and sister; Cancer in her mother and sister; Diabetes in her father and mother; Heart disease in her mother; Hypertension in her mother. SOCIAL HISTORY:  reports that she quit smoking about 36 years ago. Her smoking use included cigarettes. She has never been exposed to tobacco smoke. She has never used smokeless tobacco. She reports current alcohol use. She reports that she does not use drugs.   Review of Systems:  Gen:  Denies  fever, sweats, chills weight loss  HEENT: Denies blurred vision, double vision, ear pain, eye pain, hearing loss, nose bleeds, sore throat Cardiac:  No dizziness, chest pain or heaviness, chest tightness,edema, No JVD Resp:   No cough, -sputum production, -shortness of breath,-wheezing, -hemoptysis,  Gi: Denies swallowing difficulty, stomach pain, nausea or vomiting, diarrhea, constipation, bowel incontinence Gu:  Denies bladder incontinence, burning urine Ext:   Denies Joint pain, stiffness or swelling Skin: Denies  skin rash, easy bruising or bleeding or hives Endoc:  Denies polyuria, polydipsia , polyphagia or weight change Psych:   Denies depression, insomnia or hallucinations  Other:  All other systems negative  VITAL SIGNS: BP 128/76   Pulse 98   Temp 97.6 F (36.4 C) (Temporal)   Wt 232 lb 3.2 oz (105.3 kg)   LMP  (LMP Unknown)   SpO2 97%   BMI 41.79 kg/m    Physical Examination:   General Appearance: No distress  EYES PERRLA, EOM intact.   NECK Supple, No JVD Pulmonary: normal breath sounds, No wheezing.   CardiovascularNormal S1,S2.  No m/r/g.   Abdomen: Benign, Soft, non-tender. Skin:   warm, no rashes, no ecchymosis  Extremities: normal, no cyanosis, clubbing. Neuro:without focal findings,  speech normal  PSYCHIATRIC: Mood, affect within normal limits.   ASSESSMENT AND PLAN  OSA Counseled patient on increasing CPAP compliance. For mask leaks, will try patient on the Airfit F40 FFM. Discussed the consequences of untreated sleep apnea. Advised not to drive drowsy for safety of patient and others. Will follow up in 3 months.   HTN Stable, on current management. Following with PCP.   Morbid obesity Counseled patient on diet and lifestyle modification.    Patient  satisfied with Plan of action and management. All questions answered  I spent a total of 30 minutes reviewing chart data, face-to-face evaluation with the patient, counseling and coordination of care as detailed above.    Joey Hudock, M.D.  Sleep Medicine [facility]

## 2024-01-23 ENCOUNTER — Ambulatory Visit

## 2024-01-28 ENCOUNTER — Ambulatory Visit

## 2024-01-30 ENCOUNTER — Ambulatory Visit: Admitting: Physical Therapy

## 2024-02-05 ENCOUNTER — Other Ambulatory Visit: Payer: Self-pay | Admitting: Internal Medicine

## 2024-02-05 ENCOUNTER — Ambulatory Visit: Attending: Cardiology | Admitting: Cardiology

## 2024-02-05 VITALS — BP 130/82 | HR 79 | Ht 62.0 in | Wt 233.6 lb

## 2024-02-05 DIAGNOSIS — I10 Essential (primary) hypertension: Secondary | ICD-10-CM | POA: Insufficient documentation

## 2024-02-05 DIAGNOSIS — R002 Palpitations: Secondary | ICD-10-CM | POA: Insufficient documentation

## 2024-02-05 DIAGNOSIS — I2510 Atherosclerotic heart disease of native coronary artery without angina pectoris: Secondary | ICD-10-CM | POA: Insufficient documentation

## 2024-02-05 DIAGNOSIS — E782 Mixed hyperlipidemia: Secondary | ICD-10-CM | POA: Insufficient documentation

## 2024-02-05 DIAGNOSIS — Z0000 Encounter for general adult medical examination without abnormal findings: Secondary | ICD-10-CM

## 2024-02-05 NOTE — Patient Instructions (Signed)
 Medication Instructions:  Your physician recommends that you continue on your current medications as directed. Please refer to the Current Medication list given to you today.   *If you need a refill on your cardiac medications before your next appointment, please call your pharmacy*  Lab Work: Your provider would like for you to return in 3 months to have the following labs drawn: fasting lipid panel.   Please go to [facility] [address] ([facility]) [address], [place] [address] You do not need an appointment.  They are open from 8 am- 4:30 pm.  Lunch from 1:00 pm- 2:00 pm You do need to be fasting.  If you have labs (blood work) drawn today and your tests are completely normal, you will receive your results only by: MyChart Message (if you have MyChart) OR A paper copy in the mail If you have any lab test that is abnormal or we need to change your treatment, we will call you to review the results.  Testing/Procedures: No test ordered today   Follow-Up: At [facility], you and your health needs are our priority.  As part of our continuing mission to provide you with exceptional heart care, our providers are all part of one team.  This team includes your primary Cardiologist (physician) and Advanced Practice Providers or APPs (Physician Assistants and Nurse Practitioners) who all work together to provide you with the care you need, when you need it.  Your next appointment:   4 month(s)  Provider:   You may see Dr Darliss or one of the following Advanced Practice Providers on your designated Care Team:   Lonni Meager, NP Lesley Maffucci, PA-C Bernardino Bring, PA-C Cadence Bon[place], PA-C Tylene Lunch, NP Barnie Hila, NP    We recommend signing up for the patient portal called MyChart.  Sign up information is provided on this After Visit Summary.  MyChart is used to connect with patients for Virtual Visits (Telemedicine).  Patients are able  to view lab/test results, encounter notes, upcoming appointments, etc.  Non-urgent messages can be sent to your provider as well.   To learn more about what you can do with MyChart, go to [email or web address].

## 2024-02-05 NOTE — Progress Notes (Signed)
 Cardiology Office Note:    Date:  02/05/2024   ID:  Monique Graham, DOB June 02, 1955, MRN [id number]  PCP:  Petrina Pries, NP   [facility] Providers Cardiologist:  None     Referring MD: Petrina Pries, NP   Chief Complaint  Patient presents with   Establish Care    New pt has been doing well with no complaints of chest pain, chest pressure or SOB, medciation reviewed verbally with patient    History of Present Illness:    Monique Graham is a 68 y.o. female with a hx of hypertension, hyperlipidemia, PVCs, coronary calcifications, former smoker x 10 years, OSA on CPAP, morbid obesity, CKD presenting to establish care.  Moved to the area 1 year ago from [place] .  Previously seen cardiology due to coronary calcifications, palpitations deemed secondary to PVCs on cardiac monitor.  Was started on metoprolol  titrate with good effect.  States palpitations are adequately controlled while on metoprolol .  Has previously tried 2 different statins for hyperlipidemia but did not tolerate.  Was prescribed Zetia  but not very compliant with this.  Also prescribed Repatha  but patient worried about needles has not started using.  Denies chest pain, denies shortness of breath.  Transthoracic echocardiogram 2022 showed normal EF 55 to 60%. Exercise nuclear stress test 2022 showed low risk for significant ischemia  Past Medical History:  Diagnosis Date   Acute kidney failure    Atherosclerotic heart disease of native coronary artery without angina pectoris    Chronic kidney disease (CKD)    Congestive heart failure (CHF) (HCC)    Coronary atherosclerosis due to calcified coronary lesion    Cyst of left kidney    Diabetes mellitus without complication (HCC)    Diverticulosis of large intestine without perforation or abscess without bleeding    Endometrial intraepithelial neoplasia (EIN)    Enlarged heart    Folate deficiency anemia    Gastric ulcer    Hyperlipidemia     Hypertension    Migraine    Nonerosive esophageal reflux disease    Nonrheumatic mitral (valve) insufficiency    Obstructive sleep apnea    Osteonecrosis (HCC)    Osteoporosis    Other specified disorders of bone density and structure, multiple sites    Ovarian cyst    Polycystic ovary syndrome    Premature ventricular contractions    Restless leg syndrome    Vaginal Pap smear, abnormal    Vitamin B12 deficiency    Vitamin D deficiency     Past Surgical History:  Procedure Laterality Date   ABDOMINAL HYSTERECTOMY     OOPHORECTOMY      Current Medications: Current Meds  Medication Sig   aspirin EC 81 MG tablet Take 81 mg by mouth daily.   azelastine  (ASTELIN ) 0.1 % nasal spray Place 2 sprays into both nostrils 2 (two) times daily. Use in each nostril as directed   benzonatate  (TESSALON ) 200 MG capsule Take 1 capsule (200 mg total) by mouth 2 (two) times daily as needed for cough.   cloNIDine  (CATAPRES ) 0.1 MG tablet Take 1 tablet (0.1 mg total) by mouth 2 (two) times daily.   desloratadine  (CLARINEX ) 5 MG tablet TAKE 1 TABLET (5 MG TOTAL) BY MOUTH DAILY.   diclofenac  Sodium (VOLTAREN ) 1 % GEL Apply 2 g topically daily as needed.   estradiol  (ESTRACE  VAGINAL) 0.1 MG/GM vaginal cream 1 applicatorful (1 gram) per vagina every evening for 2 weeks then once or twice weekly for maintenance  ezetimibe  (ZETIA ) 10 MG tablet Take 1 tablet (10 mg total) by mouth daily.   fluticasone  (FLONASE ) 50 MCG/ACT nasal spray Place 2 sprays into both nostrils daily.   Folic Acid (FOLATE PO) Take 666 mcg by mouth daily at 12 noon.   meclizine  (ANTIVERT ) 25 MG tablet Take 1 tablet (25 mg total) by mouth 3 (three) times daily as needed for dizziness.   metoprolol  tartrate (LOPRESSOR ) 50 MG tablet Take 1 tablet (50 mg total) by mouth 2 (two) times daily.   olmesartan  (BENICAR ) 40 MG tablet Take 1 tablet (40 mg total) by mouth daily.   OZEMPIC , 1 MG/DOSE, 4 MG/3ML SOPN INJECT 1 MG INTO THE SKIN ONE  TIME PER WEEK   topiramate  (TOPAMAX ) 50 MG tablet Take 1 tablet (50 mg total) by mouth at bedtime.     Allergies:   Oxycodone-acetaminophen, Latex, Lactose, Statins, and Sulfa antibiotics   Social History   Socioeconomic History   Marital status: Married    Spouse name: Not on file   Number of children: 0   Years of education: Not on file   Highest education level: Doctorate  Occupational History   Not on file  Tobacco Use   Smoking status: Former    Current packs/day: 0.00    Types: Cigarettes    Quit date: 1989    Years since quitting: 36.7    Passive exposure: Never   Smokeless tobacco: Never   Tobacco comments:    Started smoking at 68 yrs old.    Smoked 1PPD at her heaviest.    Quit smoking in 1989- khj 08/26/2023  Vaping Use   Vaping status: Never Used  Substance and Sexual Activity   Alcohol use: Yes    Comment: RARELY   Drug use: Never   Sexual activity: Yes  Other Topics Concern   Not on file  Social History Narrative   Caffiene every now and then.    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    Social Drivers of Corporate investment banker Strain: Low Risk  (02/05/2024)   Overall Financial Resource Strain (CARDIA)    Difficulty of Paying Living Expenses: Not very hard  Food Insecurity: No Food Insecurity (02/05/2024)   Hunger Vital Sign    Worried About Running Out of Food in the Last Year: Never true    Ran Out of Food in the Last Year: Never true  Transportation Needs: No Transportation Needs (02/05/2024)   PRAPARE - Administrator, Civil Service (Medical): No    Lack of Transportation (Non-Medical): No  Physical Activity: Inactive (02/05/2024)   Exercise Vital Sign    Days of Exercise per Week: 0 days    Minutes of Exercise per Session: Not on file  Stress: No Stress Concern Present (02/05/2024)   [employer] of Occupational Health - Occupational Stress Questionnaire    Feeling of Stress: Only a little  Social Connections: Socially  Integrated (02/05/2024)   Social Connection and Isolation Panel    Frequency of Communication with Friends and Family: More than three times a week    Frequency of Social Gatherings with Friends and Family: Patient declined    Attends Religious Services: More than 4 times per year    Active Member of [employer] or Organizations: Yes    Attends Engineer, structural: More than 4 times per year    Marital Status: Married     Family History: The patient's family history includes ALS in her cousin;  Breast cancer in her cousin, mother, and sister; Cancer in her mother and sister; Diabetes in her father and mother; Heart disease in her mother; Hypertension in her mother. There is no history of Migraines.  ROS:   Please see the history of present illness.     All other systems reviewed and are negative.  EKGs/Labs/Other Studies Reviewed:    The following studies were reviewed today:  EKG Interpretation Date/Time:  Wednesday February 05 2024 10:26:17 EDT Ventricular Rate:  79 PR Interval:  166 QRS Duration:  66 QT Interval:  386 QTC Calculation: 442 R Axis:   -11  Text Interpretation: Normal sinus rhythm Minimal voltage criteria for LVH, may be normal variant ( R in aVL ) Confirmed by Darliss Rogue ([id number]) on 02/05/2024 10:43:45 AM    Recent Labs: 06/20/2023: TSH 1.690 10/04/2023: ALT 18; BUN 16; Creatinine, Ser 1.23; Hemoglobin 12.8; Platelets 244; Potassium 4.8; Sodium 140  Recent Lipid Panel    Component Value Date/Time   CHOL 247 (H) 10/04/2023 1126   CHOL 257 (H) 10/02/2011 0614   TRIG 173 (H) 10/04/2023 1126   TRIG 203 (H) 10/02/2011 0614   HDL 40 10/04/2023 1126   HDL 35 (L) 10/02/2011 0614   CHOLHDL 6.2 (H) 10/04/2023 1126   VLDL 41 (H) 10/02/2011 0614   LDLCALC 175 (H) 10/04/2023 1126   LDLCALC 181 (H) 10/02/2011 0614     Risk Assessment/Calculations:              Physical Exam:    VS:  BP 130/82 (BP Location: Left Arm, Patient Position: Sitting, Cuff  Size: Normal)   Pulse 79   Ht 5' 2 (1.575 m)   Wt 233 lb 9.6 oz (106 kg)   LMP  (LMP Unknown)   SpO2 95%   BMI 42.73 kg/m     Wt Readings from Last 3 Encounters:  02/05/24 233 lb 9.6 oz (106 kg)  01/22/24 232 lb 3.2 oz (105.3 kg)  12/24/23 232 lb 12.8 oz (105.6 kg)     GEN:  Well nourished, well developed in no acute distress HEENT: Normal NECK: No JVD; No carotid bruits CARDIAC: RRR, no murmurs, rubs, gallops RESPIRATORY:  Clear to auscultation without rales, wheezing or rhonchi  ABDOMEN: Soft, non-tender, non-distended MUSCULOSKELETAL:  No edema; No deformity  SKIN: Warm and dry NEUROLOGIC:  Alert and oriented x 3 PSYCHIATRIC:  Normal affect   ASSESSMENT:    1. Palpitations   2. Primary hypertension   3. Mixed hyperlipidemia   4. Coronary artery calcification    PLAN:    In order of problems listed above:  Palpitations, history of PVCs, adequately controlled with beta-blocker.  Continue Lopressor  50 mg twice daily.  Previous echo EF 55 to 60%. Hypertension, BP controlled.  Continue Benicar  40 mg daily, clonidine  0.1 mg twice daily. Hyperlipidemia, cholesterol not controlled.  Repatha  and Zetia  10 mg daily compliance advised.  Repeat lipid panel in 3 months. Coronary calcifications on CT.  Denies chest pain.  Continue aspirin 81 mg daily, Zetia , Repatha  as above.  Last EF normal.  Follow-up in 3 to 4 months after repeat lipid panel       Medication Adjustments/Labs and Tests Ordered: Current medicines are reviewed at length with the patient today.  Concerns regarding medicines are outlined above.  Orders Placed This Encounter  Procedures   Lipid panel   EKG 12-Lead   No orders of the defined types were placed in this encounter.   Patient Instructions  Medication Instructions:  Your physician recommends that you continue on your current medications as directed. Please refer to the Current Medication list given to you today.   *If you need a refill on your  cardiac medications before your next appointment, please call your pharmacy*  Lab Work: Your provider would like for you to return in 3 months to have the following labs drawn: fasting lipid panel.   Please go to [facility] [address] ([facility] [address], [place] [address] You do not need an appointment.  They are open from 8 am- 4:30 pm.  Lunch from 1:00 pm- 2:00 pm You do need to be fasting.  If you have labs (blood work) drawn today and your tests are completely normal, you will receive your results only by: MyChart Message (if you have MyChart) OR A paper copy in the mail If you have any lab test that is abnormal or we need to change your treatment, we will call you to review the results.  Testing/Procedures: No test ordered today   Follow-Up: At [facility], you and your health needs are our priority.  As part of our continuing mission to provide you with exceptional heart care, our providers are all part of one team.  This team includes your primary Cardiologist (physician) and Advanced Practice Providers or APPs (Physician Assistants and Nurse Practitioners) who all work together to provide you with the care you need, when you need it.  Your next appointment:   4 month(s)  Provider:   You may see Dr Darliss or one of the following Advanced Practice Providers on your designated Care Team:   Lonni Meager, NP Lesley Maffucci, PA-C Bernardino Bring, PA-C Cadence Salley, PA-C Tylene Lunch, NP Barnie Hila, NP    We recommend signing up for the patient portal called MyChart.  Sign up information is provided on this After Visit Summary.  MyChart is used to connect with patients for Virtual Visits (Telemedicine).  Patients are able to view lab/test results, encounter notes, upcoming appointments, etc.  Non-urgent messages can be sent to your provider as well.   To learn more about what you can do with MyChart, go to  [email or web address].             Signed, Redell Darliss, MD  02/05/2024 11:33 AM     [facility]

## 2024-02-06 ENCOUNTER — Encounter (INDEPENDENT_AMBULATORY_CARE_PROVIDER_SITE_OTHER): Payer: Self-pay

## 2024-02-06 ENCOUNTER — Other Ambulatory Visit (INDEPENDENT_AMBULATORY_CARE_PROVIDER_SITE_OTHER): Payer: Self-pay | Admitting: Physician Assistant

## 2024-02-07 ENCOUNTER — Ambulatory Visit (INDEPENDENT_AMBULATORY_CARE_PROVIDER_SITE_OTHER): Admitting: Internal Medicine

## 2024-02-07 ENCOUNTER — Other Ambulatory Visit: Payer: Self-pay | Admitting: Family Medicine

## 2024-02-07 ENCOUNTER — Encounter: Payer: Self-pay | Admitting: Internal Medicine

## 2024-02-07 VITALS — BP 134/84 | HR 80 | Resp 16 | Ht 62.0 in | Wt 234.0 lb

## 2024-02-07 DIAGNOSIS — G43719 Chronic migraine without aura, intractable, without status migrainosus: Secondary | ICD-10-CM

## 2024-02-07 DIAGNOSIS — E785 Hyperlipidemia, unspecified: Secondary | ICD-10-CM

## 2024-02-07 DIAGNOSIS — Z0000 Encounter for general adult medical examination without abnormal findings: Secondary | ICD-10-CM

## 2024-02-07 DIAGNOSIS — M109 Gout, unspecified: Secondary | ICD-10-CM

## 2024-02-07 DIAGNOSIS — I129 Hypertensive chronic kidney disease with stage 1 through stage 4 chronic kidney disease, or unspecified chronic kidney disease: Secondary | ICD-10-CM | POA: Diagnosis not present

## 2024-02-07 DIAGNOSIS — E66813 Obesity, class 3: Secondary | ICD-10-CM | POA: Diagnosis not present

## 2024-02-07 DIAGNOSIS — Z7985 Long-term (current) use of injectable non-insulin antidiabetic drugs: Secondary | ICD-10-CM

## 2024-02-07 DIAGNOSIS — E1122 Type 2 diabetes mellitus with diabetic chronic kidney disease: Secondary | ICD-10-CM | POA: Diagnosis not present

## 2024-02-07 DIAGNOSIS — Z23 Encounter for immunization: Secondary | ICD-10-CM

## 2024-02-07 DIAGNOSIS — N1831 Chronic kidney disease, stage 3a: Secondary | ICD-10-CM

## 2024-02-07 DIAGNOSIS — G4733 Obstructive sleep apnea (adult) (pediatric): Secondary | ICD-10-CM

## 2024-02-07 DIAGNOSIS — Z6841 Body Mass Index (BMI) 40.0 and over, adult: Secondary | ICD-10-CM

## 2024-02-07 MED ORDER — TIRZEPATIDE 5 MG/0.5ML ~~LOC~~ SOAJ: 5.0000 mg | SUBCUTANEOUS | 1 refills | Status: DC

## 2024-02-07 NOTE — Progress Notes (Signed)
 New Patient Office Visit  Subjective    Patient ID: Monique Graham, female    DOB: 06-07-1955  Age: 68 y.o. MRN: [id number]  CC:  Chief Complaint  Patient presents with   Establish Care   Diabetes    HPI Monique Graham presents to establish care.  Discussed the use of AI scribe software for clinical note transcription with the patient, who gave verbal consent to proceed.  History of Present Illness Monique Graham is a 68 year old female who presents for establishment of care.  She manages hypertension with metoprolol  50 mg twice daily, olmesartan  40 mg, and clonidine . Her hyperlipidemia is treated with Zetia  and Repatha , which she recently resumed. Her A1c is 6.0%, and she is on Ozempic  1 mg for diabetes management, with no significant weight loss noted. She underwent Roux-en-Y gastric bypass in 2005 and gained 32 pounds post-surgery.  Migraines are effectively managed with Topamax . She uses meclizine  as needed for vertigo-related dizziness. Gout is managed with tart cherry juice, and she has not been taking allopurinol recently. Sleep apnea is present, with leg pain at night improving with B complex vitamins. She has kidney disease, managed by her nephrologist.   Hypertension/OSA: -Medications: Metoprolol  50 mg BID, Olmesartan  40 mg, Clonidine  0.1 mg BID -Patient is compliant with above medications and reports no side effects. -Denies any SOB, CP, vision changes, LE edema or symptoms of hypotension -Following with Cardiology and Pulmonology  HLD: -Medications: Repatha , Zetia  10 mg -Patient is compliant with above medications and reports no side effects.  -Last lipid panel: Lipid Panel     Component Value Date/Time   CHOL 247 (H) 10/04/2023 1126   CHOL 257 (H) 10/02/2011 0614   TRIG 173 (H) 10/04/2023 1126   TRIG 203 (H) 10/02/2011 0614   HDL 40 10/04/2023 1126   HDL 35 (L) 10/02/2011 0614   CHOLHDL 6.2 (H) 10/04/2023 1126   VLDL 41 (H)  10/02/2011 0614   LDLCALC 175 (H) 10/04/2023 1126   LDLCALC 181 (H) 10/02/2011 9385   LABVLDL 32 10/04/2023 1126    Diabetes, Type 2: -Last A1c 6.0% 5/25 -Medications: Ozempic  1 mg but interested in switching to Mounjaro   -Patient is compliant with the above medications and reports no side effects.  -Eye exam: Due, needs referral  -Foot exam: Due -Microalbumin: Due -Statin: No on Repatha  -PNA vaccine: Due today -Denies symptoms of hypoglycemia, polyuria, polydipsia, numbness extremities, foot ulcers/trauma.  Hx of Roux-en Y: -Surgery in 2005 -No longer on vitamins and supplements from that surgery -Gained 30 pounds since surgery  Migraines:  -Following with Neurology -Currently on Topamax  50 mg and symptoms stable  Health Maintenance: -Blood work UTD -Mammogram scheduled -Colon cancer screening: colonoscopy at [facility]  -Flu and Prevnar 20 today  Outpatient Encounter Medications as of 02/07/2024  Medication Sig   aspirin EC 81 MG tablet Take 81 mg by mouth daily.   azelastine  (ASTELIN ) 0.1 % nasal spray Place 2 sprays into both nostrils 2 (two) times daily. Use in each nostril as directed   benzonatate  (TESSALON ) 200 MG capsule Take 1 capsule (200 mg total) by mouth 2 (two) times daily as needed for cough.   cloNIDine  (CATAPRES ) 0.1 MG tablet Take 1 tablet (0.1 mg total) by mouth 2 (two) times daily.   desloratadine  (CLARINEX ) 5 MG tablet TAKE 1 TABLET (5 MG TOTAL) BY MOUTH DAILY.   diclofenac  Sodium (VOLTAREN ) 1 % GEL Apply 2 g topically daily as needed.   estradiol  (ESTRACE  VAGINAL)  0.1 MG/GM vaginal cream 1 applicatorful (1 gram) per vagina every evening for 2 weeks then once or twice weekly for maintenance   Evolocumab  (REPATHA  SURECLICK) 140 MG/ML SOAJ Inject 140 mg into the skin every 14 (fourteen) days.   ezetimibe  (ZETIA ) 10 MG tablet Take 1 tablet (10 mg total) by mouth daily.   fluticasone  (FLONASE ) 50 MCG/ACT nasal spray SPRAY 2 SPRAYS INTO EACH NOSTRIL EVERY DAY    Folic Acid (FOLATE PO) Take 666 mcg by mouth daily at 12 noon.   meclizine  (ANTIVERT ) 25 MG tablet Take 1 tablet (25 mg total) by mouth 3 (three) times daily as needed for dizziness.   metoprolol  tartrate (LOPRESSOR ) 50 MG tablet Take 1 tablet (50 mg total) by mouth 2 (two) times daily.   olmesartan  (BENICAR ) 40 MG tablet Take 1 tablet (40 mg total) by mouth daily.   OZEMPIC , 1 MG/DOSE, 4 MG/3ML SOPN INJECT 1 MG INTO THE SKIN ONE TIME PER WEEK   topiramate  (TOPAMAX ) 50 MG tablet Take 1 tablet (50 mg total) by mouth at bedtime.   allopurinol (ZYLOPRIM) 100 MG tablet Take 100 mg by mouth daily. (Patient not taking: Reported on 02/07/2024)   mometasone  (NASONEX ) 50 MCG/ACT nasal spray Place 2 sprays into the nose daily. (Patient not taking: Reported on 02/05/2024)   [DISCONTINUED] fluticasone  (FLONASE ) 50 MCG/ACT nasal spray Place 2 sprays into both nostrils daily.   No facility-administered encounter medications on file as of 02/07/2024.    Past Medical History:  Diagnosis Date   Acute kidney failure    Atherosclerotic heart disease of native coronary artery without angina pectoris    Chronic kidney disease (CKD)    Congestive heart failure (CHF) (HCC)    Coronary atherosclerosis due to calcified coronary lesion    Cyst of left kidney    Diabetes mellitus without complication (HCC)    Diverticulosis of large intestine without perforation or abscess without bleeding    Endometrial intraepithelial neoplasia (EIN)    Enlarged heart    Folate deficiency anemia    Gastric ulcer    Hyperlipidemia    Hypertension    Migraine    Nonerosive esophageal reflux disease    Nonrheumatic mitral (valve) insufficiency    Obstructive sleep apnea    Osteonecrosis (HCC)    Osteoporosis    Other specified disorders of bone density and structure, multiple sites    Ovarian cyst    Polycystic ovary syndrome    Premature ventricular contractions    Restless leg syndrome    Vaginal Pap smear, abnormal     Vitamin B12 deficiency    Vitamin D deficiency     Past Surgical History:  Procedure Laterality Date   ABDOMINAL HYSTERECTOMY     OOPHORECTOMY      Family History  Problem Relation Age of Onset   Cancer Mother    Hypertension Mother    Diabetes Mother    Heart disease Mother        Deceased   Breast cancer Mother    Fainting Mother    Diabetes Father    Cancer Sister    Breast cancer Sister    Breast cancer Cousin    ALS Cousin    Migraines Neg Hx     Social History   Socioeconomic History   Marital status: Married    Spouse name: Not on file   Number of children: 0   Years of education: Not on file   Highest education level: Doctorate  Occupational History  Not on file  Tobacco Use   Smoking status: Former    Current packs/day: 0.00    Types: Cigarettes    Quit date: 1989    Years since quitting: 36.7    Passive exposure: Never   Smokeless tobacco: Never   Tobacco comments:    Started smoking at 68 yrs old.    Smoked 1PPD at her heaviest.    Quit smoking in 1989- khj 08/26/2023  Vaping Use   Vaping status: Never Used  Substance and Sexual Activity   Alcohol use: Yes    Comment: RARELY   Drug use: Never   Sexual activity: Yes  Other Topics Concern   Not on file  Social History Narrative   Caffiene every now and then.    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    Social Drivers of Corporate investment banker Strain: Low Risk  (02/05/2024)   Overall Financial Resource Strain (CARDIA)    Difficulty of Paying Living Expenses: Not very hard  Food Insecurity: No Food Insecurity (02/05/2024)   Hunger Vital Sign    Worried About Running Out of Food in the Last Year: Never true    Ran Out of Food in the Last Year: Never true  Transportation Needs: No Transportation Needs (02/05/2024)   PRAPARE - Administrator, Civil Service (Medical): No    Lack of Transportation (Non-Medical): No  Physical Activity: Inactive (02/05/2024)   Exercise  Vital Sign    Days of Exercise per Week: 0 days    Minutes of Exercise per Session: Not on file  Stress: No Stress Concern Present (02/05/2024)   [employer] of Occupational Health - Occupational Stress Questionnaire    Feeling of Stress: Only a little  Social Connections: Socially Integrated (02/05/2024)   Social Connection and Isolation Panel    Frequency of Communication with Friends and Family: More than three times a week    Frequency of Social Gatherings with Friends and Family: Patient declined    Attends Religious Services: More than 4 times per year    Active Member of [employer] or Organizations: Yes    Attends Engineer, structural: More than 4 times per year    Marital Status: Married  Catering manager Violence: Not At Risk (03/01/2023)   Humiliation, Afraid, Rape, and Kick questionnaire    Fear of Current or Ex-Partner: No    Emotionally Abused: No    Physically Abused: No    Sexually Abused: No    Review of Systems  All other systems reviewed and are negative.       Objective    BP 134/84   Pulse 80   Resp 16   Ht 5' 2 (1.575 m)   Wt 234 lb (106.1 kg)   LMP  (LMP Unknown)   SpO2 96%   BMI 42.80 kg/m   Physical Exam Constitutional:      Appearance: Normal appearance.  HENT:     Head: Normocephalic and atraumatic.  Eyes:     Conjunctiva/sclera: Conjunctivae normal.  Cardiovascular:     Rate and Rhythm: Normal rate and regular rhythm.  Pulmonary:     Effort: Pulmonary effort is normal.     Breath sounds: Normal breath sounds.  Skin:    General: Skin is warm and dry.  Neurological:     General: No focal deficit present.     Mental Status: She is alert. Mental status is at baseline.  Psychiatric:  Mood and Affect: Mood normal.        Behavior: Behavior normal.         Assessment & Plan:   Assessment & Plan Type 2 diabetes mellitus with diabetic chronic kidney disease Diabetes well-controlled with A1c of 6.0. Chronic kidney  disease likely secondary to diabetes. Discussed switching to Mounjaro  for weight management and improved diabetes control. Mounjaro 's dual action on GLP-1 and GIP may offer additional benefits. Insurance coverage for Mounjaro  to be verified. - Switch Ozempic  to Mounjaro  5 mg weekly. - Perform urine test to assess kidney function. - Refer to ophthalmologist for diabetic eye exam. - Schedule follow-up in one month to assess response to Mounjaro .  Hypertensive chronic kidney disease Hypertension well-controlled. Chronic kidney disease may be related to hypertension. Long-term antihypertensive therapy necessary. - Continue metoprolol , olmesartan , and clonidine .  Obesity, class 3 Obesity contributes to diabetes and hypertension. Mounjaro  expected to aid in weight loss more effectively than Ozempic . - Switch to Mounjaro  for potential weight loss benefits.  Obstructive sleep apnea - Switch to Mounjaro  for potential benefits in managing sleep apnea.  Hyperlipidemia Hyperlipidemia managed with Zetia  and Repatha . Repatha  expected to improve lipid profile and reduce cardiovascular risk. - Continue Zetia  and Repatha . - Recheck lipid panel in spring.  Migraine Migraines well-controlled with Topamax . - Continue Topamax  as prescribed.  Gout Gout managed with tart cherry juice. Allopurinol not needed due to infrequent attacks. - Continue tart cherry juice as needed for gout symptoms.  - Urine Microalbumin w/creat. ratio - tirzepatide  (MOUNJARO ) 5 MG/0.5ML Pen; Inject 5 mg into the skin once a week.  Dispense: 2 mL; Refill: 1 - Ambulatory referral to Ophthalmology - Flu vaccine HIGH DOSE PF(Fluzone Trivalent) - Pneumococcal conjugate vaccine 20-valent (Prevnar 20)  Return in about 4 weeks (around 03/06/2024).   Sharyle Fischer, DO

## 2024-02-17 ENCOUNTER — Encounter (INDEPENDENT_AMBULATORY_CARE_PROVIDER_SITE_OTHER): Payer: Self-pay

## 2024-02-17 ENCOUNTER — Other Ambulatory Visit: Payer: Self-pay | Admitting: Family Medicine

## 2024-02-17 ENCOUNTER — Telehealth: Admitting: Physician Assistant

## 2024-02-17 DIAGNOSIS — I129 Hypertensive chronic kidney disease with stage 1 through stage 4 chronic kidney disease, or unspecified chronic kidney disease: Secondary | ICD-10-CM

## 2024-02-17 DIAGNOSIS — R3989 Other symptoms and signs involving the genitourinary system: Secondary | ICD-10-CM | POA: Diagnosis not present

## 2024-02-17 MED ORDER — CEPHALEXIN 500 MG PO CAPS: 500.0000 mg | ORAL_CAPSULE | Freq: Two times a day (BID) | ORAL | 0 refills | Status: AC

## 2024-02-17 NOTE — Progress Notes (Signed)
 Message sent to patient requesting further input regarding current symptoms. Awaiting patient response.

## 2024-02-17 NOTE — Progress Notes (Signed)
 We are sorry that you are not feeling well.  Here is how we plan to help!  Based on what you shared with me it looks like you most likely have a simple urinary tract infection.  A UTI (Urinary Tract Infection) is a bacterial infection of the bladder.  Most cases of urinary tract infections are simple to treat but a key part of your care is to encourage you to drink plenty of fluids and watch your symptoms carefully.  I have prescribed Keflex  500 mg twice a day for 7 days.  Your symptoms should gradually improve. Call us  if the burning in your urine worsens, you develop worsening fever, back pain or pelvic pain or if your symptoms do not resolve after completing the antibiotic.  Urinary tract infections can be prevented by drinking plenty of water to keep your body hydrated.  Also be sure when you wipe, wipe from front to back and don't hold it in!  If possible, empty your bladder every 4 hours.  Finish the entire course of antibiotics, even if you start feeling better, to ensure the infection is fully cleared.  Drink extra water to dilute urine and help flush bacteria from your bladder and urinary tract.  Limit or avoid alcohol, caffeine, and carbonated drinks, as these can irritate your bladder.  Try to empty your bladder completely each time you urinate and don't hold it in.  Apply a heating pad on a low setting to your lower belly or back to help with bladder pressure or discomfort. A warm bath can also provide relief.   Please go to the ER immediately if worsening symptoms, onset of fever, chills, uncontrolled vomiting, back pain, worsening abdominal pain Follow-up with your PCP in 5-7 days, if needed      Your e-visit answers were reviewed by a board certified advanced clinical practitioner to complete your personal care plan.  Depending on the condition, your plan could have included both over the counter or prescription medications.  If there is a problem please reply  once you have  received a response from your provider.  Your safety is important to us .  If you have drug allergies check your prescription carefully.    You can use MyChart to ask questions about today's visit, request a non-urgent call back, or ask for a work or school excuse for 24 hours related to this e-Visit. If it has been greater than 24 hours you will need to follow up with your provider, or enter a new e-Visit to address those concerns.   You will get an e-mail in the next two days asking about your experience.  I hope that your e-visit has been valuable and will speed your recovery. Thank you for using e-visits.  I have spent 5 minutes in review of e-visit questionnaire, review and updating patient chart, medical decision making and response to patient.   Shahmeer Bunn, PA-C

## 2024-02-18 ENCOUNTER — Other Ambulatory Visit: Payer: Self-pay | Admitting: Internal Medicine

## 2024-02-18 DIAGNOSIS — I129 Hypertensive chronic kidney disease with stage 1 through stage 4 chronic kidney disease, or unspecified chronic kidney disease: Secondary | ICD-10-CM

## 2024-02-21 ENCOUNTER — Encounter

## 2024-03-09 ENCOUNTER — Ambulatory Visit
Admission: RE | Admit: 2024-03-09 | Discharge: 2024-03-09 | Disposition: A | Discharge status: Home / Self Care | Source: Ambulatory Visit | Attending: Internal Medicine | Admitting: Internal Medicine

## 2024-03-09 DIAGNOSIS — Z0000 Encounter for general adult medical examination without abnormal findings: Secondary | ICD-10-CM

## 2024-03-10 ENCOUNTER — Other Ambulatory Visit: Payer: Self-pay

## 2024-03-10 ENCOUNTER — Ambulatory Visit (INDEPENDENT_AMBULATORY_CARE_PROVIDER_SITE_OTHER): Admitting: Internal Medicine

## 2024-03-10 VITALS — BP 130/84 | HR 94 | Temp 98.0°F | Resp 16 | Ht 62.0 in | Wt 235.2 lb

## 2024-03-10 DIAGNOSIS — E1122 Type 2 diabetes mellitus with diabetic chronic kidney disease: Secondary | ICD-10-CM

## 2024-03-10 DIAGNOSIS — G4733 Obstructive sleep apnea (adult) (pediatric): Secondary | ICD-10-CM

## 2024-03-10 DIAGNOSIS — N3000 Acute cystitis without hematuria: Secondary | ICD-10-CM | POA: Diagnosis not present

## 2024-03-10 DIAGNOSIS — E66812 Obesity, class 2: Secondary | ICD-10-CM

## 2024-03-10 DIAGNOSIS — I129 Hypertensive chronic kidney disease with stage 1 through stage 4 chronic kidney disease, or unspecified chronic kidney disease: Secondary | ICD-10-CM

## 2024-03-10 DIAGNOSIS — E785 Hyperlipidemia, unspecified: Secondary | ICD-10-CM

## 2024-03-10 DIAGNOSIS — Z6838 Body mass index (BMI) 38.0-38.9, adult: Secondary | ICD-10-CM

## 2024-03-10 DIAGNOSIS — N1831 Chronic kidney disease, stage 3a: Secondary | ICD-10-CM

## 2024-03-10 DIAGNOSIS — E1169 Type 2 diabetes mellitus with other specified complication: Secondary | ICD-10-CM

## 2024-03-10 DIAGNOSIS — E6609 Other obesity due to excess calories: Secondary | ICD-10-CM

## 2024-03-10 DIAGNOSIS — R296 Repeated falls: Secondary | ICD-10-CM

## 2024-03-10 LAB — POCT GLYCOSYLATED HEMOGLOBIN (HGB A1C): Hemoglobin A1C: 6 % — AB (ref 4.0–5.6)

## 2024-03-10 LAB — POCT URINALYSIS DIPSTICK
Bilirubin, UA: NEGATIVE
Glucose, UA: NEGATIVE
Ketones, UA: NEGATIVE
Nitrite, UA: POSITIVE
Protein, UA: POSITIVE — AB
Spec Grav, UA: 1.02 (ref 1.010–1.025)
Urobilinogen, UA: 0.2 U/dL
pH, UA: 6 (ref 5.0–8.0)

## 2024-03-10 MED ORDER — LIDOCAINE HCL (PF) 1 % IJ SOLN
2.0000 mL | Freq: Once | INTRAMUSCULAR | Status: AC
  Administered 2024-03-10: 2 mL via INTRADERMAL

## 2024-03-10 MED ORDER — TIRZEPATIDE 7.5 MG/0.5ML ~~LOC~~ SOAJ: 7.5000 mg | SUBCUTANEOUS | 1 refills | Status: DC

## 2024-03-10 MED ORDER — CEFTRIAXONE SODIUM 500 MG IJ SOLR: 1000.0000 mg | Freq: Once | INTRAMUSCULAR | Status: DC

## 2024-03-10 MED ORDER — CEFTRIAXONE SODIUM 1 G IJ SOLR
1.0000 g | Freq: Once | INTRAMUSCULAR | Status: AC
  Administered 2024-03-10: 1 g via INTRAMUSCULAR

## 2024-03-10 MED ORDER — LIDOCAINE HCL (PF) 1 % IJ SOLN: 2.0000 mL | Freq: Once | INTRAMUSCULAR | Status: DC

## 2024-03-10 NOTE — Progress Notes (Signed)
 Established Patient Office Visit  Subjective    Patient ID: Monique Graham, female    DOB: 22-May-1955  Age: 68 y.o. MRN: [id number]  CC:  Chief Complaint  Patient presents with   Medical Management of Chronic Issues   Fall   Urinary Tract Infection    HPI Monique Graham presents to follow up on chronic medical conditions.   Discussed the use of AI scribe software for clinical note transcription with the patient, who gave verbal consent to proceed.  History of Present Illness Monique Graham is a 68 year old female who presents with persistent urinary tract infection symptoms.  She completed a course of Keflex nine days ago, which reduced the pain during urination, but she continues to experience a strong odor. She is allergic to sulfa drugs and has previously responded well to Cipro  for UTIs.  Her diabetes is well-controlled with an A1c of 6.0. She takes Mounjaro  weekly without side effects and monitors her blood sugar regularly, though she admits to occasional dietary lapses.  She has experienced two recent falls, resulting in soreness in her legs and arms, which she manages with Tylenol. She attributes the falls to tripping rather than dizziness or vertigo.  She uses a CPAP machine for sleep apnea but experiences congestion upon waking. She has a history of high cholesterol and has been prescribed Repatha , which she has not been taking regularly.   Hypertension/OSA: -Medications: Metoprolol  50 mg BID, Olmesartan  40 mg, Clonidine  0.1 mg BID -Patient is compliant with above medications and reports no side effects. -Denies any SOB, CP, vision changes, LE edema or symptoms of hypotension -Following with Cardiology and Pulmonology  HLD: -Medications: Repatha , Zetia  10 mg -Patient is compliant with above medications and reports no side effects.  -Last lipid panel: Lipid Panel     Component Value Date/Time   CHOL 247 (H) 10/04/2023 1126   CHOL 257  (H) 10/02/2011 0614   TRIG 173 (H) 10/04/2023 1126   TRIG 203 (H) 10/02/2011 0614   HDL 40 10/04/2023 1126   HDL 35 (L) 10/02/2011 0614   CHOLHDL 6.2 (H) 10/04/2023 1126   VLDL 41 (H) 10/02/2011 0614   LDLCALC 175 (H) 10/04/2023 1126   LDLCALC 181 (H) 10/02/2011 0614   LABVLDL 32 10/04/2023 1126    Diabetes, Type 2: -Last A1c 6.0% 5/25 -Medications: switched from Ozempic  to Mounjaro  5 mg at LOV, doing very well  -Patient is compliant with the above medications and reports no side effects.  -Eye exam: Has upcoming appointment -Foot exam: Due -Microalbumin: Due -Statin: No on Repatha  -PNA vaccine: UTD -Denies symptoms of hypoglycemia, polyuria, polydipsia, numbness extremities, foot ulcers/trauma.  Hx of Roux-en Y: -Surgery in 2005 -No longer on vitamins and supplements from that surgery -Gained 30 pounds since surgery  Migraines:  -Following with Neurology -Currently on Topamax  50 mg and symptoms stable  Health Maintenance: -Blood work UTD -Mammogram yesterday, waiting for results -Colon cancer screening: colonoscopy at [facility]   Outpatient Encounter Medications as of 03/10/2024  Medication Sig   aspirin EC 81 MG tablet Take 81 mg by mouth daily.   azelastine  (ASTELIN ) 0.1 % nasal spray Place 2 sprays into both nostrils 2 (two) times daily. Use in each nostril as directed   cloNIDine  (CATAPRES ) 0.1 MG tablet TAKE 1 TABLET BY MOUTH 2 TIMES DAILY.   desloratadine  (CLARINEX ) 5 MG tablet TAKE 1 TABLET (5 MG TOTAL) BY MOUTH DAILY.   diclofenac  Sodium (VOLTAREN ) 1 % GEL Apply 2 g  topically daily as needed.   estradiol  (ESTRACE  VAGINAL) 0.1 MG/GM vaginal cream 1 applicatorful (1 gram) per vagina every evening for 2 weeks then once or twice weekly for maintenance   Evolocumab  (REPATHA  SURECLICK) 140 MG/ML SOAJ Inject 140 mg into the skin every 14 (fourteen) days.   ezetimibe  (ZETIA ) 10 MG tablet Take 1 tablet (10 mg total) by mouth daily.   fluticasone  (FLONASE ) 50 MCG/ACT nasal  spray SPRAY 2 SPRAYS INTO EACH NOSTRIL EVERY DAY   Folic Acid (FOLATE PO) Take 666 mcg by mouth daily at 12 noon.   meclizine  (ANTIVERT ) 25 MG tablet Take 1 tablet (25 mg total) by mouth 3 (three) times daily as needed for dizziness.   metoprolol  tartrate (LOPRESSOR ) 50 MG tablet Take 1 tablet (50 mg total) by mouth 2 (two) times daily.   olmesartan  (BENICAR ) 40 MG tablet Take 1 tablet (40 mg total) by mouth daily.   tirzepatide  (MOUNJARO ) 5 MG/0.5ML Pen Inject 5 mg into the skin once a week.   topiramate  (TOPAMAX ) 50 MG tablet Take 1 tablet (50 mg total) by mouth at bedtime.   No facility-administered encounter medications on file as of 03/10/2024.    Past Medical History:  Diagnosis Date   Acute kidney failure    Atherosclerotic heart disease of native coronary artery without angina pectoris    Chronic kidney disease (CKD)    Congestive heart failure (CHF) (HCC)    Coronary atherosclerosis due to calcified coronary lesion    Cyst of left kidney    Diabetes mellitus without complication (HCC)    Diverticulosis of large intestine without perforation or abscess without bleeding    Endometrial intraepithelial neoplasia (EIN)    Enlarged heart    Folate deficiency anemia    Gastric ulcer    Hyperlipidemia    Hypertension    Migraine    Nonerosive esophageal reflux disease    Nonrheumatic mitral (valve) insufficiency    Obstructive sleep apnea    Osteonecrosis (HCC)    Osteoporosis    Other specified disorders of bone density and structure, multiple sites    Ovarian cyst    Polycystic ovary syndrome    Premature ventricular contractions    Restless leg syndrome    Vaginal Pap smear, abnormal    Vitamin B12 deficiency    Vitamin D deficiency     Past Surgical History:  Procedure Laterality Date   ABDOMINAL HYSTERECTOMY     OOPHORECTOMY      Family History  Problem Relation Age of Onset   Cancer Mother    Hypertension Mother    Diabetes Mother    Heart disease Mother         Deceased   Breast cancer Mother    Fainting Mother    Diabetes Father    Cancer Sister    Breast cancer Sister    Breast cancer Cousin    ALS Cousin    Migraines Neg Hx     Social History   Socioeconomic History   Marital status: Married    Spouse name: Not on file   Number of children: 0   Years of education: Not on file   Highest education level: Doctorate  Occupational History   Not on file  Tobacco Use   Smoking status: Former    Current packs/day: 0.00    Types: Cigarettes    Quit date: 1989    Years since quitting: 36.8    Passive exposure: Never   Smokeless tobacco: Never  Tobacco comments:    Started smoking at 68 yrs old.    Smoked 1PPD at her heaviest.    Quit smoking in 1989- khj 08/26/2023  Vaping Use   Vaping status: Never Used  Substance and Sexual Activity   Alcohol use: Yes    Comment: RARELY   Drug use: Never   Sexual activity: Yes  Other Topics Concern   Not on file  Social History Narrative   Caffiene every now and then.    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    Social Drivers of Corporate Investment Banker Strain: Low Risk  (03/08/2024)   Overall Financial Resource Strain (CARDIA)    Difficulty of Paying Living Expenses: Not hard at all  Food Insecurity: No Food Insecurity (03/08/2024)   Hunger Vital Sign    Worried About Running Out of Food in the Last Year: Never true    Ran Out of Food in the Last Year: Never true  Transportation Needs: No Transportation Needs (03/08/2024)   PRAPARE - Administrator, Civil Service (Medical): No    Lack of Transportation (Non-Medical): No  Physical Activity: Insufficiently Active (03/08/2024)   Exercise Vital Sign    Days of Exercise per Week: 1 day    Minutes of Exercise per Session: 30 min  Stress: No Stress Concern Present (03/08/2024)   [employer] of Occupational Health - Occupational Stress Questionnaire    Feeling of Stress: Not at all  Social Connections:  Socially Integrated (03/08/2024)   Social Connection and Isolation Panel    Frequency of Communication with Friends and Family: More than three times a week    Frequency of Social Gatherings with Friends and Family: Patient declined    Attends Religious Services: More than 4 times per year    Active Member of [employer] or Organizations: Yes    Attends Engineer, Structural: More than 4 times per year    Marital Status: Married  Catering Manager Violence: Not At Risk (03/01/2023)   Humiliation, Afraid, Rape, and Kick questionnaire    Fear of Current or Ex-Partner: No    Emotionally Abused: No    Physically Abused: No    Sexually Abused: No    Review of Systems  Genitourinary:  Positive for dysuria.        Objective    BP 130/84 (Cuff Size: Large)   Pulse 94   Temp 98 F (36.7 C) (Oral)   Resp 16   Ht 5' 2 (1.575 m)   Wt 235 lb 3.2 oz (106.7 kg)   LMP  (LMP Unknown)   SpO2 97%   BMI 43.02 kg/m   Last Weight  Most recent update: 03/10/2024 10:30 AM    Weight  106.7 kg (235 lb 3.2 oz)              Physical Exam Constitutional:      Appearance: Normal appearance.  HENT:     Head: Normocephalic and atraumatic.  Eyes:     Conjunctiva/sclera: Conjunctivae normal.  Cardiovascular:     Rate and Rhythm: Normal rate and regular rhythm.     Pulses:          Dorsalis pedis pulses are 2+ on the right side and 2+ on the left side.  Pulmonary:     Effort: Pulmonary effort is normal.     Breath sounds: Normal breath sounds.  Musculoskeletal:     Right foot: Normal range of motion. No deformity, bunion,  Charcot foot, foot drop or prominent metatarsal heads.     Left foot: Normal range of motion. No deformity, bunion, Charcot foot, foot drop or prominent metatarsal heads.  Feet:     Right foot:     Protective Sensation: 6 sites tested.  6 sites sensed.     Skin integrity: Skin integrity normal.     Toenail Condition: Right toenails are normal.     Left foot:      Protective Sensation: 6 sites tested.  6 sites sensed.     Skin integrity: Skin integrity normal.     Toenail Condition: Left toenails are normal.  Skin:    General: Skin is warm and dry.  Neurological:     General: No focal deficit present.     Mental Status: She is alert. Mental status is at baseline.  Psychiatric:        Mood and Affect: Mood normal.        Behavior: Behavior normal.         Assessment & Plan:   Assessment & Plan  Acute cystitis Persistent UTI symptoms with leukocytes and nitrates in urinalysis. Previous Cipro  use effective but resistance a concern. Ceftriaxone chosen for immediate coverage. - Send urine sample for culture to identify bacterial growth and resistance. - Administer 1 gram ceftriaxone injection for immediate antibiotic coverage. - Determine further antibiotic treatment based on culture results.  Type 2 diabetes mellitus with diabetic chronic kidney disease A1c at 6.0, indicating well-managed glycemic control. - Continue current diabetes management regimen. - Perform microalbumin test to assess kidney function.  Hypertension -Blood pressure stable here today, no changes made to medications and appropriate refills sent to pharmacy.   Obesity Weight stable at 235 lbs. Mounjaro  well-tolerated. Increasing dose may aid weight loss and improve sleep apnea. - Increase Mounjaro  dose from 5 mg to 7.5 mg to aid in weight loss.  Hyperlipidemia in patient with history of coronary artery bypass graft Elevated LDL cholesterol with non-compliance to Repatha . Repatha  crucial for managing cholesterol and reducing cardiovascular risk. - Encourage resumption of Repatha  injections every two weeks. - Monitor cholesterol levels regularly.  Obstructive sleep apnea Reports congestion with CPAP use. Weight loss could improve symptoms. Mounjaro  indicated for weight loss benefits. - Discuss CPAP-related issues with sleep specialist. - Continue weight loss efforts  with increased Mounjaro  dose.  Recent mechanical falls Falls due to tripping, not dizziness. Suspected depth perception issues. - Discuss falls and potential depth perception issues with eye doctor during upcoming appointment.  Suspected iron deficiency Non-compliance with iron supplements. Previous labs normal hemoglobin but did not check iron levels. - Recommend taking iron supplements every other day to improve iron stores. - Plan to re-evaluate iron levels during next lab workup in the spring.  - POCT HgB A1C - tirzepatide  (MOUNJARO ) 7.5 MG/0.5ML Pen; Inject 7.5 mg into the skin once a week.  Dispense: 6 mL; Refill: 1 - Urine Microalbumin w/creat. ratio - HM Diabetes Foot Exam - POCT urinalysis dipstick - Urine Culture - cefTRIAXone (ROCEPHIN) injection 1 g - lidocaine (PF) (XYLOCAINE) 1 % injection 2 mL   Return in about 3 months (around 06/10/2024).   Sharyle Fischer, DO

## 2024-03-11 ENCOUNTER — Ambulatory Visit: Payer: Self-pay | Admitting: Internal Medicine

## 2024-03-13 ENCOUNTER — Other Ambulatory Visit: Payer: Self-pay | Admitting: Internal Medicine

## 2024-03-13 DIAGNOSIS — R928 Other abnormal and inconclusive findings on diagnostic imaging of breast: Secondary | ICD-10-CM

## 2024-03-13 LAB — URINE CULTURE
MICRO NUMBER:: 17158423
SPECIMEN QUALITY:: ADEQUATE

## 2024-03-13 LAB — MICROALBUMIN / CREATININE URINE RATIO
Creatinine, Urine: 176 mg/dL (ref 20–275)
Microalb Creat Ratio: 6 mg/g{creat} (ref <30)
Microalb, Ur: 1.1 mg/dL

## 2024-03-16 ENCOUNTER — Other Ambulatory Visit: Payer: Self-pay | Admitting: Internal Medicine

## 2024-03-16 DIAGNOSIS — N3000 Acute cystitis without hematuria: Secondary | ICD-10-CM

## 2024-03-16 MED ORDER — AMOXICILLIN-POT CLAVULANATE 500-125 MG PO TABS: 1.0000 | ORAL_TABLET | Freq: Two times a day (BID) | ORAL | 0 refills | Status: AC

## 2024-03-24 ENCOUNTER — Ambulatory Visit
Admission: RE | Admit: 2024-03-24 | Discharge: 2024-03-24 | Disposition: A | Source: Ambulatory Visit | Attending: Internal Medicine

## 2024-03-24 ENCOUNTER — Ambulatory Visit
Admission: RE | Admit: 2024-03-24 | Discharge: 2024-03-24 | Disposition: A | Discharge status: Home / Self Care | Source: Ambulatory Visit | Attending: Internal Medicine | Admitting: Internal Medicine

## 2024-03-24 DIAGNOSIS — R928 Other abnormal and inconclusive findings on diagnostic imaging of breast: Secondary | ICD-10-CM

## 2024-03-25 ENCOUNTER — Other Ambulatory Visit: Payer: Self-pay | Admitting: Internal Medicine

## 2024-03-25 ENCOUNTER — Other Ambulatory Visit: Payer: Self-pay | Admitting: Acute Care

## 2024-03-25 ENCOUNTER — Ambulatory Visit: Payer: Self-pay | Admitting: Internal Medicine

## 2024-03-25 DIAGNOSIS — R928 Other abnormal and inconclusive findings on diagnostic imaging of breast: Secondary | ICD-10-CM

## 2024-04-15 ENCOUNTER — Other Ambulatory Visit: Payer: Self-pay | Admitting: Family Medicine

## 2024-04-16 ENCOUNTER — Other Ambulatory Visit: Payer: Self-pay

## 2024-04-16 ENCOUNTER — Other Ambulatory Visit (HOSPITAL_COMMUNITY): Payer: Self-pay

## 2024-04-16 MED ORDER — DICLOFENAC SODIUM 1 % EX GEL
2.0000 g | Freq: Every day | CUTANEOUS | 1 refills | Status: AC | PRN
  Filled 2024-04-16: qty 100, 50d supply, fill #0

## 2024-04-17 ENCOUNTER — Encounter: Payer: Self-pay | Admitting: Pharmacist

## 2024-04-17 ENCOUNTER — Other Ambulatory Visit: Payer: Self-pay

## 2024-04-17 ENCOUNTER — Other Ambulatory Visit (HOSPITAL_COMMUNITY): Payer: Self-pay

## 2024-04-22 ENCOUNTER — Ambulatory Visit: Admitting: Sleep Medicine

## 2024-04-28 ENCOUNTER — Ambulatory Visit

## 2024-04-29 ENCOUNTER — Ambulatory Visit: Admitting: Sleep Medicine

## 2024-05-01 ENCOUNTER — Ambulatory Visit

## 2024-05-01 VITALS — BP 130/84 | Ht 62.0 in | Wt 233.0 lb

## 2024-05-01 DIAGNOSIS — Z0000 Encounter for general adult medical examination without abnormal findings: Secondary | ICD-10-CM | POA: Diagnosis not present

## 2024-05-01 NOTE — Progress Notes (Signed)
 " I connected with  Monique Graham on 05/01/2024 by a video and audio enabled telemedicine application and verified that I am speaking with the correct person using two identifiers.  Patient Location: Home  Provider Location: Home Office  Persons Participating in Visit: Patient.  I discussed the limitations of evaluation and management by telemedicine. The patient expressed understanding and agreed to proceed.  Vital Signs: Because this visit was a virtual/telehealth visit, some criteria may be missing or patient reported. Any vitals not documented were not able to be obtained and vitals that have been documented are patient reported.  Chief Complaint  Patient presents with   Medicare Wellness     Subjective:   [name] is a 68 y.o. female who presents for a Medicare Annual Wellness Visit.  Visit info / Clinical Intake: Medicare Wellness Visit Type:: Subsequent Annual Wellness Visit Persons participating in visit and providing information:: patient Medicare Wellness Visit Mode:: Video Since this visit was completed virtually, some vitals may be partially provided or unavailable. Missing vitals are due to the limitations of the virtual format.: Documented vitals are patient reported If Telephone or Video please confirm:: I connected with patient using audio/video enable telemedicine. I verified patient identity with two identifiers, discussed telehealth limitations, and patient agreed to proceed. Patient Location:: home Provider Location:: home office Interpreter Needed?: No Pre-visit prep was completed: yes AWV questionnaire completed by patient prior to visit?: no Living arrangements:: lives with spouse/significant other Patient's Overall Health Status Rating: very good Typical amount of pain: (!) a lot Does pain affect daily life?: no Are you currently prescribed opioids?: no  Dietary Habits and Nutritional Risks How many meals a day?: 2 Eats fruit  and vegetables daily?: yes Most meals are obtained by: preparing own meals In the last 2 weeks, have you had any of the following?: none Diabetic:: (!) yes Any non-healing wounds?: no How often do you check your BS?: 1 Would you like to be referred to a Nutritionist or for Diabetic Management? : no  Functional Status Activities of Daily Living (to include ambulation/medication): Independent Ambulation: Independent Medication Administration: Independent Home Management (perform basic housework or laundry): Independent Manage your own finances?: yes Primary transportation is: driving Concerns about vision?: no *vision screening is required for WTM* Concerns about hearing?: no  Fall Screening Falls in the past year?: 1 Number of falls in past year: 1 Was there an injury with Fall?: 1 Fall Risk Category Calculator: 3 Patient Fall Risk Level: High Fall Risk  Fall Risk Patient at Risk for Falls Due to: History of fall(s); Impaired balance/gait Fall risk Follow up: Falls evaluation completed; Falls prevention discussed; Education provided  Home and Transportation Safety: All rugs have non-skid backing?: yes All stairs or steps have railings?: yes Grab bars in the bathtub or shower?: yes Have non-skid surface in bathtub or shower?: yes Good home lighting?: yes Regular seat belt use?: yes Hospital stays in the last year:: no  Cognitive Assessment Difficulty concentrating, remembering, or making decisions? : no Will 6CIT or Mini Cog be Completed: yes What year is it?: 0 points What month is it?: 0 points Give patient an address phrase to remember (5 components): remember words apple , table, penny About what time is it?: 0 points Count backwards from 20 to 1: 0 points Say the months of the year in reverse: 0 points Repeat the address phrase from earlier: 0 points 6 CIT Score: 0 points  Advance Directives (For Healthcare) Does Patient Have a  Medical Advance Directive?:  No Would patient like information on creating a medical advance directive?: No - Patient declined  Reviewed/Updated  Reviewed/Updated: Reviewed All (Medical, Surgical, Family, Medications, Allergies, Care Teams, Patient Goals)    Allergies (verified) Oxycodone-acetaminophen, Latex, Lactose, Statins, and Sulfa antibiotics   Current Medications (verified) Outpatient Encounter Medications as of 05/01/2024  Medication Sig   aspirin EC 81 MG tablet Take 81 mg by mouth daily.   azelastine  (ASTELIN ) 0.1 % nasal spray Place 2 sprays into both nostrils 2 (two) times daily. Use in each nostril as directed   cloNIDine  (CATAPRES ) 0.1 MG tablet TAKE 1 TABLET BY MOUTH 2 TIMES DAILY.   desloratadine  (CLARINEX ) 5 MG tablet TAKE 1 TABLET (5 MG TOTAL) BY MOUTH DAILY.   diclofenac  Sodium (VOLTAREN ) 1 % GEL Apply 2 grams topically daily as needed.   estradiol  (ESTRACE  VAGINAL) 0.1 MG/GM vaginal cream 1 applicatorful (1 gram) per vagina every evening for 2 weeks then once or twice weekly for maintenance   Evolocumab  (REPATHA  SURECLICK) 140 MG/ML SOAJ Inject 140 mg into the skin every 14 (fourteen) days.   ezetimibe  (ZETIA ) 10 MG tablet Take 1 tablet (10 mg total) by mouth daily.   fluticasone  (FLONASE ) 50 MCG/ACT nasal spray SPRAY 2 SPRAYS INTO EACH NOSTRIL EVERY DAY   Folic Acid (FOLATE PO) Take 666 mcg by mouth daily at 12 noon.   meclizine  (ANTIVERT ) 25 MG tablet Take 1 tablet (25 mg total) by mouth 3 (three) times daily as needed for dizziness.   metoprolol  tartrate (LOPRESSOR ) 50 MG tablet Take 1 tablet (50 mg total) by mouth 2 (two) times daily.   olmesartan  (BENICAR ) 40 MG tablet Take 1 tablet (40 mg total) by mouth daily.   tirzepatide  (MOUNJARO ) 7.5 MG/0.5ML Pen Inject 7.5 mg into the skin once a week.   topiramate  (TOPAMAX ) 50 MG tablet Take 1 tablet (50 mg total) by mouth at bedtime.   No facility-administered encounter medications on file as of 05/01/2024.    History: Past Medical History:   Diagnosis Date   Acute kidney failure    Atherosclerotic heart disease of native coronary artery without angina pectoris    Chronic kidney disease (CKD)    Congestive heart failure (CHF) (HCC)    Coronary atherosclerosis due to calcified coronary lesion    Cyst of left kidney    Diabetes mellitus without complication (HCC)    Diverticulosis of large intestine without perforation or abscess without bleeding    Endometrial intraepithelial neoplasia (EIN)    Enlarged heart    Folate deficiency anemia    Gastric ulcer    Hyperlipidemia    Hypertension    Migraine    Nonerosive esophageal reflux disease    Nonrheumatic mitral (valve) insufficiency    Obstructive sleep apnea    Osteonecrosis (HCC)    Osteoporosis    Other specified disorders of bone density and structure, multiple sites    Ovarian cyst    Polycystic ovary syndrome    Premature ventricular contractions    Restless leg syndrome    Vaginal Pap smear, abnormal    Vitamin B12 deficiency    Vitamin D deficiency    Past Surgical History:  Procedure Laterality Date   ABDOMINAL HYSTERECTOMY     OOPHORECTOMY     Family History  Problem Relation Age of Onset   Cancer Mother    Hypertension Mother    Diabetes Mother    Heart disease Mother        Deceased  Breast cancer Mother    Fainting Mother    Diabetes Father    Cancer Sister    Breast cancer Sister    Breast cancer Cousin    ALS Cousin    Migraines Neg Hx    Social History   Occupational History   Not on file  Tobacco Use   Smoking status: Former    Current packs/day: 0.00    Types: Cigarettes    Quit date: 1989    Years since quitting: 36.9    Passive exposure: Never   Smokeless tobacco: Never   Tobacco comments:    Started smoking at 68 yrs old.    Smoked 1PPD at her heaviest.    Quit smoking in 1989- khj 08/26/2023  Vaping Use   Vaping status: Never Used  Substance and Sexual Activity   Alcohol use: Yes    Comment: RARELY   Drug use:  Never   Sexual activity: Yes   Tobacco Counseling Counseling given: Not Answered Tobacco comments: Started smoking at 68 yrs old. Smoked 1PPD at her heaviest. Quit smoking in 1989- khj 08/26/2023  SDOH Screenings   Food Insecurity: No Food Insecurity (05/01/2024)  Housing: Low Risk (05/01/2024)  Transportation Needs: No Transportation Needs (05/01/2024)  Utilities: Not At Risk (05/01/2024)  Alcohol Screen: Low Risk (02/05/2024)  Depression (PHQ2-9): Low Risk (05/01/2024)  Financial Resource Strain: Low Risk (03/08/2024)  Physical Activity: Patient Declined (05/01/2024)  Recent Concern: Physical Activity - Insufficiently Active (03/08/2024)  Social Connections: Socially Integrated (05/01/2024)  Stress: No Stress Concern Present (05/01/2024)  Tobacco Use: Medium Risk (05/01/2024)  Health Literacy: Adequate Health Literacy (05/01/2024)   See flowsheets for full screening details  Depression Screen PHQ 2 & 9 Depression Scale- Over the past 2 weeks, how often have you been bothered by any of the following problems? Little interest or pleasure in doing things: 0 Feeling down, depressed, or hopeless (PHQ Adolescent also includes...irritable): 0 PHQ-2 Total Score: 0 Trouble falling or staying asleep, or sleeping too much: 0 Feeling tired or having little energy: 0 Poor appetite or overeating (PHQ Adolescent also includes...weight loss): 0 Feeling bad about yourself - or that you are a failure or have let yourself or your family down: 0 Trouble concentrating on things, such as reading the newspaper or watching television (PHQ Adolescent also includes...like school work): 0 Moving or speaking so slowly that other people could have noticed. Or the opposite - being so fidgety or restless that you have been moving around a lot more than usual: 0 Thoughts that you would be better off dead, or of hurting yourself in some way: 0 PHQ-9 Total Score: 0 If you checked off any problems, how  difficult have these problems made it for you to do your work, take care of things at home, or get along with other people?: Not difficult at all  Depression Treatment Depression Interventions/Treatment : EYV7-0 Score <4 Follow-up Not Indicated     Goals Addressed               This Visit's Progress     Patient Stated (pt-stated)        Patient would like to lose some weight              Objective:    Today's Vitals   05/01/24 0859  BP: 130/84  Weight: 233 lb (105.7 kg)  Height: 5' 2 (1.575 m)   Body mass index is 42.62 kg/m.  Hearing/Vision screen Hearing Screening - Comments:: No difficulties  Vision Screening - Comments:: Patient wears glasses and see Dr. SHAUNNA at [facility] eye  Immunizations and Health Maintenance Health Maintenance  Topic Date Due   OPHTHALMOLOGY EXAM  Never done   Hepatitis C Screening  Never done   Colonoscopy  Never done   COVID-19 Vaccine (3 - Pfizer risk series) 02/24/2020   Medicare Annual Wellness (AWV)  02/29/2024   Zoster Vaccines- Shingrix (1 of 2) 05/06/2024 (Originally 09/02/1974)   HEMOGLOBIN A1C  09/08/2024   Diabetic kidney evaluation - eGFR measurement  10/03/2024   Diabetic kidney evaluation - Urine ACR  03/10/2025   FOOT EXAM  03/10/2025   Mammogram  03/09/2026   DTaP/Tdap/Td (3 - Td or Tdap) 01/17/2033   Pneumococcal Vaccine: 50+ Years  Completed   Influenza Vaccine  Completed   Bone Density Scan  Completed   Meningococcal B Vaccine  Aged Out        Assessment/Plan:  This is a routine wellness examination for Hara.  Patient Care Team: Bernardo Fend, DO as PCP - General (Internal Medicine) Buck Saucer, MD as Attending Physician (Neurology)  I have personally reviewed and noted the following in the patients chart:   Medical and social history Use of alcohol, tobacco or illicit drugs  Current medications and supplements including opioid prescriptions. Functional ability and status Nutritional  status Physical activity Advanced directives List of other physicians Hospitalizations, surgeries, and ER visits in previous 12 months Vitals Screenings to include cognitive, depression, and falls Referrals and appointments  No orders of the defined types were placed in this encounter.  In addition, I have reviewed and discussed with patient certain preventive protocols, quality metrics, and best practice recommendations. A written personalized care plan for preventive services as well as general preventive health recommendations were provided to patient.   Lyle MARLA Right, CMA   05/01/2024   No follow-ups on file.  After Visit Summary: (MyChart) Due to this being a telephonic visit, the after visit summary with patients personalized plan was offered to patient via MyChart    "

## 2024-05-01 NOTE — Patient Instructions (Signed)
 Monique Graham,  Thank you for taking the time for your Medicare Wellness Visit. I appreciate your continued commitment to your health goals. Please review the care plan we discussed, and feel free to reach out if I can assist you further.  Please note that Annual Wellness Visits do not include a physical exam. Some assessments may be limited, especially if the visit was conducted virtually. If needed, we may recommend an in-person follow-up with your provider.  Ongoing Care Seeing your primary care provider every 3 to 6 months helps us  monitor your health and provide consistent, personalized care.   Referrals If a referral was made during today's visit and you haven't received any updates within two weeks, please contact the referred provider directly to check on the status.  Recommended Screenings:  Health Maintenance  Topic Date Due   Eye exam for diabetics  Never done   Hepatitis C Screening  Never done   Colon Cancer Screening  Never done   COVID-19 Vaccine (3 - Pfizer risk series) 02/24/2020   Medicare Annual Wellness Visit  02/29/2024   Zoster (Shingles) Vaccine (1 of 2) 05/06/2024*   Hemoglobin A1C  09/08/2024   Yearly kidney function blood test for diabetes  10/03/2024   Yearly kidney health urinalysis for diabetes  03/10/2025   Complete foot exam   03/10/2025   Breast Cancer Screening  03/09/2026   DTaP/Tdap/Td vaccine (3 - Td or Tdap) 01/17/2033   Pneumococcal Vaccine for age over 59  Completed   Flu Shot  Completed   Osteoporosis screening with Bone Density Scan  Completed   Meningitis B Vaccine  Aged Out  *Topic was postponed. The date shown is not the original due date.       05/01/2024    9:01 AM  Advanced Directives  Does Patient Have a Medical Advance Directive? No  Would patient like information on creating a medical advance directive? No - Patient declined    Vision: Annual vision screenings are recommended for early detection of glaucoma, cataracts, and  diabetic retinopathy. These exams can also reveal signs of chronic conditions such as diabetes and high blood pressure.  Dental: Annual dental screenings help detect early signs of oral cancer, gum disease, and other conditions linked to overall health, including heart disease and diabetes.  Please see the attached documents for additional preventive care recommendations.

## 2024-06-09 ENCOUNTER — Ambulatory Visit: Admitting: Physician Assistant

## 2024-06-10 ENCOUNTER — Other Ambulatory Visit: Payer: Self-pay

## 2024-06-10 ENCOUNTER — Ambulatory Visit: Admitting: Internal Medicine

## 2024-06-10 ENCOUNTER — Encounter: Payer: Self-pay | Admitting: Internal Medicine

## 2024-06-10 VITALS — BP 134/78 | HR 100 | Temp 98.2°F | Resp 16 | Ht 62.0 in | Wt 231.9 lb

## 2024-06-10 DIAGNOSIS — Z7985 Long-term (current) use of injectable non-insulin antidiabetic drugs: Secondary | ICD-10-CM

## 2024-06-10 DIAGNOSIS — E1169 Type 2 diabetes mellitus with other specified complication: Secondary | ICD-10-CM

## 2024-06-10 DIAGNOSIS — I129 Hypertensive chronic kidney disease with stage 1 through stage 4 chronic kidney disease, or unspecified chronic kidney disease: Secondary | ICD-10-CM

## 2024-06-10 DIAGNOSIS — N1831 Chronic kidney disease, stage 3a: Secondary | ICD-10-CM

## 2024-06-10 DIAGNOSIS — E785 Hyperlipidemia, unspecified: Secondary | ICD-10-CM

## 2024-06-10 DIAGNOSIS — Z1159 Encounter for screening for other viral diseases: Secondary | ICD-10-CM

## 2024-06-10 DIAGNOSIS — E538 Deficiency of other specified B group vitamins: Secondary | ICD-10-CM

## 2024-06-10 DIAGNOSIS — E66813 Obesity, class 3: Secondary | ICD-10-CM

## 2024-06-10 DIAGNOSIS — R42 Dizziness and giddiness: Secondary | ICD-10-CM

## 2024-06-10 DIAGNOSIS — Z9884 Bariatric surgery status: Secondary | ICD-10-CM

## 2024-06-10 DIAGNOSIS — G629 Polyneuropathy, unspecified: Secondary | ICD-10-CM

## 2024-06-10 DIAGNOSIS — E1122 Type 2 diabetes mellitus with diabetic chronic kidney disease: Secondary | ICD-10-CM

## 2024-06-10 DIAGNOSIS — Z6841 Body Mass Index (BMI) 40.0 and over, adult: Secondary | ICD-10-CM

## 2024-06-10 LAB — POCT GLYCOSYLATED HEMOGLOBIN (HGB A1C): Hemoglobin A1C: 5.7 % — AB (ref 4.0–5.6)

## 2024-06-10 MED ORDER — TIRZEPATIDE 10 MG/0.5ML ~~LOC~~ SOAJ: 10.0000 mg | SUBCUTANEOUS | 0 refills | Status: AC

## 2024-06-10 MED ORDER — REPATHA SURECLICK 140 MG/ML ~~LOC~~ SOAJ: 140.0000 mg | SUBCUTANEOUS | 1 refills | Status: AC

## 2024-06-10 MED ORDER — EZETIMIBE 10 MG PO TABS: 10.0000 mg | ORAL_TABLET | Freq: Every day | ORAL | 1 refills | Status: AC

## 2024-06-10 MED ORDER — MECLIZINE HCL 25 MG PO TABS: 25.0000 mg | ORAL_TABLET | Freq: Three times a day (TID) | ORAL | 0 refills | Status: AC | PRN

## 2024-06-10 MED ORDER — OLMESARTAN MEDOXOMIL 40 MG PO TABS: 40.0000 mg | ORAL_TABLET | Freq: Every day | ORAL | 1 refills | Status: AC

## 2024-06-10 MED ORDER — METOPROLOL TARTRATE 50 MG PO TABS: 50.0000 mg | ORAL_TABLET | Freq: Two times a day (BID) | ORAL | 1 refills | Status: AC

## 2024-06-10 NOTE — Progress Notes (Signed)
 "  Established Patient Office Visit  Subjective    Patient ID: Monique Graham, female    DOB: 08/04/1955  Age: 69 y.o. MRN: [id number]  CC:  Chief Complaint  Patient presents with   Medical Management of Chronic Issues    3 month recheck    HPI Monique Graham presents to follow up on chronic medical conditions.   Discussed the use of AI scribe software for clinical note transcription with the patient, who gave verbal consent to proceed.  History of Present Illness  Monique Graham is a 69 year old female with a history of Roux-en-Y gastric bypass and prediabetes who presents with tingling sensations in her legs at night.  She describes a nighttime tingling throughout both legs, not involving the feet, that occurs when she is trying to fall asleep. It is noticeable but does not prevent sleep. She differentiates it from prior restless legs, describing it as tingling without an urge to move and not like muscle cramps.  She has Roux-en-Y gastric bypass and takes multivitamins, probiotics, a Super B complex with vitamin C, and calcium . She feels the B complex improves the tingling.  She uses Mounjaro  7.5 mg for weight management and has had modest weight loss since October despite eating one meal per day, which she finds frustrating. She has a past diagnosis of sleep apnea and is considering a repeat sleep study.  Her medications include Zetia , Repatha , metoprolol , olmesartan , clonidine , and meclizine . She has chronic vertigo and is awaiting reassignment to a new neurologist after her prior neurologist left.   Hypertension/OSA: -Medications: Metoprolol  50 mg BID, Olmesartan  40 mg, Clonidine  0.1 mg BID -Patient is compliant with above medications and reports no side effects. -Denies any SOB, CP, vision changes, LE edema or symptoms of hypotension -Following with Cardiology and Pulmonology  HLD: -Medications: Repatha , Zetia  10 mg -Patient is compliant with  above medications and reports no side effects.  -Last lipid panel: Lipid Panel     Component Value Date/Time   CHOL 247 (H) 10/04/2023 1126   CHOL 257 (H) 10/02/2011 0614   TRIG 173 (H) 10/04/2023 1126   TRIG 203 (H) 10/02/2011 0614   HDL 40 10/04/2023 1126   HDL 35 (L) 10/02/2011 0614   CHOLHDL 6.2 (H) 10/04/2023 1126   VLDL 41 (H) 10/02/2011 0614   LDLCALC 175 (H) 10/04/2023 1126   LDLCALC 181 (H) 10/02/2011 0614   LABVLDL 32 10/04/2023 1126    Diabetes, Type 2: -Last A1c 6.0% 10/25 -Medications: Mounjaro  increased to 7.5 mg at LOV, doing very well but frustrated with lack of weight loss, lost about 4 pounds since last visit in October -Patient is compliant with the above medications and reports no side effects.  -Eye exam: Has upcoming appointment -Foot exam: UTD -Microalbumin: UTD -Statin: No on Repatha  -PNA vaccine: UTD -Denies symptoms of hypoglycemia, polyuria, polydipsia, numbness extremities, foot ulcers/trauma.  Hx of Roux-en Y: -Surgery in 2005 -Back on vitamins and supplements from that surgery, B complex vitamin, multivitamin, probiotics and calcium  citrate  -Gained 30 pounds since surgery  Migraines:  -Following with Neurology -Currently on Topamax  50 mg and symptoms stable  Health Maintenance: -Blood work UTD -Mammogram scheduled in June -Colon cancer screening: colonoscopy at [facility], due in 3 years from now  Outpatient Encounter Medications as of 06/10/2024  Medication Sig   aspirin EC 81 MG tablet Take 81 mg by mouth daily.   cloNIDine  (CATAPRES ) 0.1 MG tablet TAKE 1 TABLET BY MOUTH 2 TIMES DAILY.  desloratadine  (CLARINEX ) 5 MG tablet TAKE 1 TABLET (5 MG TOTAL) BY MOUTH DAILY.   diclofenac  Sodium (VOLTAREN ) 1 % GEL Apply 2 grams topically daily as needed.   estradiol  (ESTRACE  VAGINAL) 0.1 MG/GM vaginal cream 1 applicatorful (1 gram) per vagina every evening for 2 weeks then once or twice weekly for maintenance   Evolocumab  (REPATHA  SURECLICK) 140  MG/ML SOAJ Inject 140 mg into the skin every 14 (fourteen) days.   ezetimibe  (ZETIA ) 10 MG tablet Take 1 tablet (10 mg total) by mouth daily.   fluticasone  (FLONASE ) 50 MCG/ACT nasal spray SPRAY 2 SPRAYS INTO EACH NOSTRIL EVERY DAY   Folic Acid (FOLATE PO) Take 666 mcg by mouth daily at 12 noon.   meclizine  (ANTIVERT ) 25 MG tablet Take 1 tablet (25 mg total) by mouth 3 (three) times daily as needed for dizziness.   metoprolol  tartrate (LOPRESSOR ) 50 MG tablet Take 1 tablet (50 mg total) by mouth 2 (two) times daily.   olmesartan  (BENICAR ) 40 MG tablet Take 1 tablet (40 mg total) by mouth daily.   tirzepatide  (MOUNJARO ) 7.5 MG/0.5ML Pen Inject 7.5 mg into the skin once a week.   topiramate  (TOPAMAX ) 50 MG tablet Take 1 tablet (50 mg total) by mouth at bedtime.   azelastine  (ASTELIN ) 0.1 % nasal spray Place 2 sprays into both nostrils 2 (two) times daily. Use in each nostril as directed   No facility-administered encounter medications on file as of 06/10/2024.    Past Medical History:  Diagnosis Date   Acute kidney failure    Atherosclerotic heart disease of native coronary artery without angina pectoris    Chronic kidney disease (CKD)    Congestive heart failure (CHF) (HCC)    Coronary atherosclerosis due to calcified coronary lesion    Cyst of left kidney    Diabetes mellitus without complication (HCC)    Diverticulosis of large intestine without perforation or abscess without bleeding    Endometrial intraepithelial neoplasia (EIN)    Enlarged heart    Folate deficiency anemia    Gastric ulcer    Hyperlipidemia    Hypertension    Migraine    Nonerosive esophageal reflux disease    Nonrheumatic mitral (valve) insufficiency    Obstructive sleep apnea    Osteonecrosis (HCC)    Osteoporosis    Other specified disorders of bone density and structure, multiple sites    Ovarian cyst    Polycystic ovary syndrome    Premature ventricular contractions    Restless leg syndrome    Vaginal  Pap smear, abnormal    Vitamin B12 deficiency    Vitamin D  deficiency     Past Surgical History:  Procedure Laterality Date   ABDOMINAL HYSTERECTOMY     OOPHORECTOMY      Family History  Problem Relation Age of Onset   Cancer Mother    Hypertension Mother    Diabetes Mother    Heart disease Mother        Deceased   Breast cancer Mother    Fainting Mother    Diabetes Father    Cancer Sister    Breast cancer Sister    Breast cancer Cousin    ALS Cousin    Migraines Neg Hx     Social History   Socioeconomic History   Marital status: Married    Spouse name: Not on file   Number of children: 0   Years of education: Not on file   Highest education level: Doctorate  Occupational History  Not on file  Tobacco Use   Smoking status: Former    Current packs/day: 0.00    Types: Cigarettes    Quit date: 1989    Years since quitting: 37.0    Passive exposure: Never   Smokeless tobacco: Never   Tobacco comments:    Started smoking at 69 yrs old.    Smoked 1PPD at her heaviest.    Quit smoking in 1989- khj 08/26/2023  Vaping Use   Vaping status: Never Used  Substance and Sexual Activity   Alcohol use: Yes    Comment: RARELY   Drug use: Never   Sexual activity: Yes  Other Topics Concern   Not on file  Social History Narrative   Caffiene every now and then.    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    Social Drivers of Health   Tobacco Use: Medium Risk (06/10/2024)   Patient History    Smoking Tobacco Use: Former    Smokeless Tobacco Use: Never    Passive Exposure: Never  Physicist, Medical Strain: Low Risk (03/08/2024)   Overall Financial Resource Strain (CARDIA)    Difficulty of Paying Living Expenses: Not hard at all  Food Insecurity: No Food Insecurity (05/01/2024)   Epic    Worried About Programme Researcher, Broadcasting/film/video in the Last Year: Never true    Ran Out of Food in the Last Year: Never true  Transportation Needs: No Transportation Needs (05/01/2024)   Epic     Lack of Transportation (Medical): No    Lack of Transportation (Non-Medical): No  Physical Activity: Patient Declined (05/01/2024)   Exercise Vital Sign    Days of Exercise per Week: Patient declined    Minutes of Exercise per Session: Patient declined  Recent Concern: Physical Activity - Insufficiently Active (03/08/2024)   Exercise Vital Sign    Days of Exercise per Week: 1 day    Minutes of Exercise per Session: 30 min  Stress: No Stress Concern Present (05/01/2024)   [employer] of Occupational Health - Occupational Stress Questionnaire    Feeling of Stress: Not at all  Social Connections: Socially Integrated (05/01/2024)   Social Connection and Isolation Panel    Frequency of Communication with Friends and Family: More than three times a week    Frequency of Social Gatherings with Friends and Family: More than three times a week    Attends Religious Services: More than 4 times per year    Active Member of Clubs or Organizations: Yes    Attends Banker Meetings: More than 4 times per year    Marital Status: Married  Catering Manager Violence: Not At Risk (05/01/2024)   Epic    Fear of Current or Ex-Partner: No    Emotionally Abused: No    Physically Abused: No    Sexually Abused: No  Depression (PHQ2-9): Low Risk (06/10/2024)   Depression (PHQ2-9)    PHQ-2 Score: 0  Alcohol Screen: Low Risk (06/10/2024)   Alcohol Screen    Last Alcohol Screening Score (AUDIT): 0  Housing: Low Risk (05/01/2024)   Epic    Unable to Pay for Housing in the Last Year: No    Number of Times Moved in the Last Year: 0    Homeless in the Last Year: No  Utilities: Not At Risk (05/01/2024)   Epic    Threatened with loss of utilities: No  Health Literacy: Adequate Health Literacy (05/01/2024)   B1300 Health Literacy    Frequency of  need for help with medical instructions: Never    Review of Systems  Gastrointestinal:  Negative for abdominal pain, constipation, heartburn,  nausea and vomiting.  Neurological:  Positive for tingling.        Objective    BP 134/78 (Cuff Size: Large)   Pulse 100   Temp 98.2 F (36.8 C) (Oral)   Resp 16   Ht 5' 2 (1.575 m)   Wt 231 lb 14.4 oz (105.2 kg)   LMP  (LMP Unknown)   SpO2 98%   BMI 42.42 kg/m   Last Weight  Most recent update: 06/10/2024 10:22 AM    Weight  105.2 kg (231 lb 14.4 oz)              Physical Exam Constitutional:      Appearance: Normal appearance.  HENT:     Head: Normocephalic and atraumatic.  Eyes:     Conjunctiva/sclera: Conjunctivae normal.  Cardiovascular:     Rate and Rhythm: Normal rate and regular rhythm.  Pulmonary:     Effort: Pulmonary effort is normal.     Breath sounds: Normal breath sounds.  Musculoskeletal:     Right lower leg: No edema.     Left lower leg: No edema.  Skin:    General: Skin is warm and dry.  Neurological:     General: No focal deficit present.     Mental Status: She is alert. Mental status is at baseline.  Psychiatric:        Mood and Affect: Mood normal.        Behavior: Behavior normal.         Assessment & Plan:   Assessment & Plan  Type 2 diabetes mellitus with stage 3a chronic kidney disease and dyslipidemia A1c at 5.7% indicates excellent glycemic control. Dyslipidemia managed with Zetia  and Repatha . Blood pressure controlled at 134/78 mmHg. - Increased Mounjaro  to 10 mg for weight loss and glycemic control. - Refilled Zetia , Repatha , metoprolol , olmesartan , and clonidine . - Ordered lipid profile and thyroid function tests. - Ordered kidney, liver, and electrolyte panels. - Ordered vitamin B12 and vitamin D  levels.  Hypertensive chronic kidney disease stage 3a Blood pressure controlled at 134/78 mmHg. - Continue current antihypertensive regimen including metoprolol  and olmesartan .  Polyneuropathy Reports tingling in legs at night, not typical of diabetic neuropathy. Differential includes vitamin deficiencies. B complex  vitamins helpful. - Ordered vitamin B12 and vitamin D  levels. - Continue B complex vitamins. - Consider gabapentin, Cymbalta, or duloxetine if symptoms worsen and labs are normal.  Vertigo Ongoing vertigo suggests neurological origin. Current neurologist has left. - Discuss vertigo management with new neurologist. - Refill Meclizine  to use as needed.   Obesity, status post bariatric surgery Weight decreased from 235 lbs to 231 lbs. Current management includes Mounjaro  and dietary modifications. Desires further weight loss. - Increased Mounjaro  to 10 mg for weight loss. - Discuss calcium  supplementation with nutritionist. - Consider dietary fiber increase and probiotics for constipation management.  General Health Maintenance Routine health maintenance up to date. Mammogram scheduled. Colonoscopy follow-up in five years. Vaccinations current. - Continue routine health maintenance and screenings as scheduled.  - POCT HgB A1C - tirzepatide  (MOUNJARO ) 10 MG/0.5ML Pen; Inject 10 mg into the skin once a week.  Dispense: 6 mL; Refill: 0 - CBC w/Diff/Platelet - Comprehensive Metabolic Panel (CMET) - metoprolol  tartrate (LOPRESSOR ) 50 MG tablet; Take 1 tablet (50 mg total) by mouth 2 (two) times daily.  Dispense: 180 tablet; Refill: 1 - olmesartan  (  BENICAR ) 40 MG tablet; Take 1 tablet (40 mg total) by mouth daily.  Dispense: 90 tablet; Refill: 1 - Evolocumab  (REPATHA  SURECLICK) 140 MG/ML SOAJ; Inject 140 mg into the skin every 14 (fourteen) days.  Dispense: 6 mL; Refill: 1 - Lipid Profile - ezetimibe  (ZETIA ) 10 MG tablet; Take 1 tablet (10 mg total) by mouth daily.  Dispense: 90 tablet; Refill: 1 - meclizine  (ANTIVERT ) 25 MG tablet; Take 1 tablet (25 mg total) by mouth 3 (three) times daily as needed for dizziness.  Dispense: 30 tablet; Refill: 0 - TSH - Vitamin D  (25 hydroxy) - Hepatitis C Antibody - Vitamin B12  Return in about 6 months (around 12/08/2024).   Sharyle Fischer, DO  "

## 2024-06-11 ENCOUNTER — Ambulatory Visit: Payer: Self-pay | Admitting: Internal Medicine

## 2024-06-11 DIAGNOSIS — E559 Vitamin D deficiency, unspecified: Secondary | ICD-10-CM

## 2024-06-11 LAB — CBC WITH DIFFERENTIAL/PLATELET
Absolute Lymphocytes: 1874 {cells}/uL (ref 850–3900)
Absolute Monocytes: 329 {cells}/uL (ref 200–950)
Basophils Absolute: 32 {cells}/uL (ref 0–200)
Basophils Relative: 0.6 %
Eosinophils Absolute: 119 {cells}/uL (ref 15–500)
Eosinophils Relative: 2.2 %
HCT: 40.2 % (ref 35.9–46.0)
Hemoglobin: 12.6 g/dL (ref 11.7–15.5)
MCH: 24.6 pg — ABNORMAL LOW (ref 27.0–33.0)
MCHC: 31.3 g/dL — ABNORMAL LOW (ref 31.6–35.4)
MCV: 78.5 fL — ABNORMAL LOW (ref 81.4–101.7)
MPV: 12.6 fL — ABNORMAL HIGH (ref 7.5–12.5)
Monocytes Relative: 6.1 %
Neutro Abs: 3046 {cells}/uL (ref 1500–7800)
Neutrophils Relative %: 56.4 %
Platelets: 265 10*3/uL (ref 140–400)
RBC: 5.12 Million/uL — ABNORMAL HIGH (ref 3.80–5.10)
RDW: 14.2 % (ref 11.0–15.0)
Total Lymphocyte: 34.7 %
WBC: 5.4 10*3/uL (ref 3.8–10.8)

## 2024-06-11 LAB — LIPID PANEL
Cholesterol: 223 mg/dL — ABNORMAL HIGH
HDL: 42 mg/dL — ABNORMAL LOW
LDL Cholesterol (Calc): 150 mg/dL — ABNORMAL HIGH
Non-HDL Cholesterol (Calc): 181 mg/dL — ABNORMAL HIGH
Total CHOL/HDL Ratio: 5.3 (calc) — ABNORMAL HIGH
Triglycerides: 177 mg/dL — ABNORMAL HIGH

## 2024-06-11 LAB — COMPREHENSIVE METABOLIC PANEL WITH GFR
AG Ratio: 1.2 (calc) (ref 1.0–2.5)
ALT: 18 U/L (ref 6–29)
AST: 18 U/L (ref 10–35)
Albumin: 4 g/dL (ref 3.6–5.1)
Alkaline phosphatase (APISO): 106 U/L (ref 37–153)
BUN/Creatinine Ratio: 10 (calc) (ref 6–22)
BUN: 12 mg/dL (ref 7–25)
CO2: 27 mmol/L (ref 20–32)
Calcium: 8.9 mg/dL (ref 8.6–10.4)
Chloride: 107 mmol/L (ref 98–110)
Creat: 1.17 mg/dL — ABNORMAL HIGH (ref 0.50–1.05)
Globulin: 3.4 g/dL (ref 1.9–3.7)
Glucose, Bld: 98 mg/dL (ref 65–99)
Potassium: 4.1 mmol/L (ref 3.5–5.3)
Sodium: 142 mmol/L (ref 135–146)
Total Bilirubin: 0.5 mg/dL (ref 0.2–1.2)
Total Protein: 7.4 g/dL (ref 6.1–8.1)
eGFR: 51 mL/min/{1.73_m2} — ABNORMAL LOW

## 2024-06-11 LAB — HEPATITIS C ANTIBODY: Hepatitis C Ab: NONREACTIVE

## 2024-06-11 LAB — TSH: TSH: 1.78 m[IU]/L (ref 0.40–4.50)

## 2024-06-11 LAB — VITAMIN D 25 HYDROXY (VIT D DEFICIENCY, FRACTURES): Vit D, 25-Hydroxy: 29 ng/mL — ABNORMAL LOW (ref 30–100)

## 2024-06-11 LAB — VITAMIN B12: Vitamin B-12: 1844 pg/mL — ABNORMAL HIGH (ref 200–1100)

## 2024-06-11 MED ORDER — VITAMIN D (ERGOCALCIFEROL) 1.25 MG (50000 UNIT) PO CAPS: 50000.0000 [IU] | ORAL_CAPSULE | ORAL | 0 refills | Status: AC

## 2024-06-19 NOTE — Progress Notes (Unsigned)
 "  Cardiology Office Note    Date:  06/19/2024   ID:  Graham, Monique [birth year], MRN [id number]  PCP:  Bernardo Fend, DO  Cardiologist:  None  Electrophysiologist:  None   Chief Complaint: Follow-up  History of Present Illness:   Monique Graham is a 69 y.o. female with history of coronary artery calcifications, hypertension, hyperlipidemia, symptomatic PVCs, prior tobacco use, OSA on CPAP, morbid obesity, and CKD presenting for follow-up on coronary artery calcifications and hyperlipidemia.    Patient was previously followed by cardiology in [place]  prior to moving to the area.  Followed for coronary calcifications, palpitations deemed to be secondary to PVCs on cardiac monitor.  Well-controlled with metoprolol .  Previously intolerant to statin therapy.  Was prescribed Zetia  but was not compliant.  Also prescribed Repatha  but patient worried about needles and had not yet started.   Patient established with Dr. Darliss 01/2024 overall feeling well without chest pain or shortness of breath.  She was recommended to be compliant with Repatha  and Zetia .  ***  Labs independently reviewed: 05/2024-Hgb 12.6, HCT 40.2, platelets 265, BUN 12, creatinine 1.17, potassium 4.1, 142, normal LFTs, TC 223, HDL 42, LDL 150, TG 177  Objective   Past Medical History:  Diagnosis Date   Acute kidney failure    Atherosclerotic heart disease of native coronary artery without angina pectoris    Chronic kidney disease (CKD)    Congestive heart failure (CHF) (HCC)    Coronary atherosclerosis due to calcified coronary lesion    Cyst of left kidney    Diabetes mellitus without complication (HCC)    Diverticulosis of large intestine without perforation or abscess without bleeding    Endometrial intraepithelial neoplasia (EIN)    Enlarged heart    Folate deficiency anemia    Gastric ulcer    Hyperlipidemia    Hypertension    Migraine    Nonerosive esophageal reflux disease     Nonrheumatic mitral (valve) insufficiency    Obstructive sleep apnea    Osteonecrosis (HCC)    Osteoporosis    Other specified disorders of bone density and structure, multiple sites    Ovarian cyst    Polycystic ovary syndrome    Premature ventricular contractions    Restless leg syndrome    Vaginal Pap smear, abnormal    Vitamin B12 deficiency    Vitamin D  deficiency     Current Medications: Active Medications[1]  Allergies:   Oxycodone-acetaminophen, Latex, Lactose, Statins, and Sulfa antibiotics   Social History   Socioeconomic History   Marital status: Married    Spouse name: Not on file   Number of children: 0   Years of education: Not on file   Highest education level: Doctorate  Occupational History   Not on file  Tobacco Use   Smoking status: Former    Current packs/day: 0.00    Types: Cigarettes    Quit date: 1989    Years since quitting: 37.1    Passive exposure: Never   Smokeless tobacco: Never   Tobacco comments:    Started smoking at 69 yrs old.    Smoked 1PPD at her heaviest.    Quit smoking in 1989- khj 08/26/2023  Vaping Use   Vaping status: Never Used  Substance and Sexual Activity   Alcohol use: Yes    Comment: RARELY   Drug use: Never   Sexual activity: Yes  Other Topics Concern   Not on file  Social History Narrative   Caffiene every  now and then.    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    Social Drivers of Health   Tobacco Use: Medium Risk (06/10/2024)   Patient History    Smoking Tobacco Use: Former    Smokeless Tobacco Use: Never    Passive Exposure: Never  Physicist, Medical Strain: Low Risk (03/08/2024)   Overall Financial Resource Strain (CARDIA)    Difficulty of Paying Living Expenses: Not hard at all  Food Insecurity: No Food Insecurity (05/01/2024)   Epic    Worried About Programme Researcher, Broadcasting/film/video in the Last Year: Never true    Ran Out of Food in the Last Year: Never true  Transportation Needs: No Transportation  Needs (05/01/2024)   Epic    Lack of Transportation (Medical): No    Lack of Transportation (Non-Medical): No  Physical Activity: Patient Declined (05/01/2024)   Exercise Vital Sign    Days of Exercise per Week: Patient declined    Minutes of Exercise per Session: Patient declined  Recent Concern: Physical Activity - Insufficiently Active (03/08/2024)   Exercise Vital Sign    Days of Exercise per Week: 1 day    Minutes of Exercise per Session: 30 min  Stress: No Stress Concern Present (05/01/2024)   [employer] of Occupational Health - Occupational Stress Questionnaire    Feeling of Stress: Not at all  Social Connections: Socially Integrated (05/01/2024)   Social Connection and Isolation Panel    Frequency of Communication with Friends and Family: More than three times a week    Frequency of Social Gatherings with Friends and Family: More than three times a week    Attends Religious Services: More than 4 times per year    Active Member of Clubs or Organizations: Yes    Attends Banker Meetings: More than 4 times per year    Marital Status: Married  Depression (PHQ2-9): Low Risk (06/10/2024)   Depression (PHQ2-9)    PHQ-2 Score: 0  Alcohol Screen: Low Risk (06/10/2024)   Alcohol Screen    Last Alcohol Screening Score (AUDIT): 0  Housing: Low Risk (05/01/2024)   Epic    Unable to Pay for Housing in the Last Year: No    Number of Times Moved in the Last Year: 0    Homeless in the Last Year: No  Utilities: Not At Risk (05/01/2024)   Epic    Threatened with loss of utilities: No  Health Literacy: Adequate Health Literacy (05/01/2024)   B1300 Health Literacy    Frequency of need for help with medical instructions: Never     Family History:  The patient's family history includes ALS in her cousin; Breast cancer in her cousin, mother, and sister; Cancer in her mother and sister; Diabetes in her father and mother; Fainting in her mother; Heart disease in her mother;  Hypertension in her mother. There is no history of Migraines.  ROS:   12-point review of systems is negative unless otherwise noted in the HPI.  EKGs/Other Studies Reviewed:    Studies reviewed were summarized above. The additional studies were reviewed today:  09/2020 Treadmill MPI   Nuclear Findings: Normal left ventricular systolic function  post-stress. LVEF measures 66%.    Nuclear Findings: Normal treadmill myocardial perfusion study at 94 %%  PHR. Findings suggest a low risk of myocardial ischemia. LVEF measures  66%.   Nuclear Findings: LVEF measures 66%. LV perfusion is normal.    ECG: Resting ECG demonstrates normal sinus rhythm.  ECG: Stress ECG was negative for ischemia.    Stress Test: A Bruce protocol stress test was performed. Overall, the  patient's exercise capacity was poor for their age. The patient exercised  for 2 min and 57 sec. Hemodynamics are adequate for diagnosis. Blood  pressure demonstrated a normal response and heart rate demonstrated a  normal response to stress.   09/2020-event monitor Rare PAC and PVC.  Occasional symptom with PVC  06/2020 2D echo    Left Ventricle: Normal left ventricular systolic function with a  visually estimated EF of 55 - 60%. Left ventricle size is normal.  Increased wall thickness. Findings consistent with mild concentric  hypertrophy. Normal wall motion. Diastolic dysfunction present with  increased LAP with normal LV EF.   EKG:  EKG personally reviewed by me today    PHYSICAL EXAM:    VS:  LMP  (LMP Unknown)   BMI: There is no height or weight on file to calculate BMI.  GEN: Well nourished, well developed in no acute distress NECK: No JVD; No carotid bruits CARDIAC: ***RRR, no murmurs, rubs, gallops RESPIRATORY:  Clear to auscultation without rales, wheezing or rhonchi  ABDOMEN: Soft, non-tender, non-distended EXTREMITIES:  *** No edema; No deformity  Wt Readings from Last 3 Encounters:  06/10/24 231 lb 14.4 oz  (105.2 kg)  05/01/24 233 lb (105.7 kg)  03/10/24 235 lb 3.2 oz (106.7 kg)                  ASSESSMENT & PLAN:   PVCs Palpitations   Coronary artery calcification Hyperlipidemia   Hypertension    {Are you ordering a CV Procedure (e.g. stress test, cath, DCCV, TEE, etc)?   Press F2        :[id number]}   Disposition: F/u with Dr. Darliss or an APP in ***.   Medication Adjustments/Labs and Tests Ordered: Current medicines are reviewed at length with the patient today.  Concerns regarding medicines are outlined above. Medication changes, Labs and Tests ordered today are summarized above and listed in the Patient Instructions accessible in Encounters.   Bonney Lesley Maffucci, PA-C 06/19/2024 12:59 PM     [facility] [place] [address] [place], [place] [address] [phone]      [1]  No outpatient medications have been marked as taking for the 06/22/24 encounter (Appointment) with Maffucci Lesley CROME, PA-C.   "

## 2024-06-22 ENCOUNTER — Ambulatory Visit: Admitting: Physician Assistant

## 2024-06-29 ENCOUNTER — Telehealth: Admitting: Neurology

## 2024-06-30 ENCOUNTER — Ambulatory Visit: Admitting: Neurology

## 2024-10-23 ENCOUNTER — Encounter

## 2024-10-23 ENCOUNTER — Other Ambulatory Visit

## 2024-12-08 ENCOUNTER — Ambulatory Visit: Admitting: Internal Medicine
